# Patient Record
Sex: Male | Born: 1945 | Race: White | Hispanic: No | Smoking: Current every day smoker
Health system: Southern US, Community
[De-identification: ages and names within clinical notes are randomized; demographics above are authoritative.]

## PROBLEM LIST (undated history)

## (undated) DIAGNOSIS — D510 Vitamin B12 deficiency anemia due to intrinsic factor deficiency: Secondary | ICD-10-CM

## (undated) DIAGNOSIS — D649 Anemia, unspecified: Secondary | ICD-10-CM

## (undated) DIAGNOSIS — D696 Thrombocytopenia, unspecified: Secondary | ICD-10-CM

## (undated) DIAGNOSIS — E871 Hypo-osmolality and hyponatremia: Secondary | ICD-10-CM

## (undated) DIAGNOSIS — S065X9A Traumatic subdural hemorrhage with loss of consciousness of unspecified duration, initial encounter: Secondary | ICD-10-CM

## (undated) DIAGNOSIS — E291 Testicular hypofunction: Secondary | ICD-10-CM

## (undated) DIAGNOSIS — R251 Tremor, unspecified: Secondary | ICD-10-CM

## (undated) DIAGNOSIS — C099 Malignant neoplasm of tonsil, unspecified: Secondary | ICD-10-CM

## (undated) DIAGNOSIS — C801 Malignant (primary) neoplasm, unspecified: Secondary | ICD-10-CM

## (undated) HISTORY — PX: LUMBAR DISC SURGERY: SHX700

## (undated) HISTORY — DX: Malignant neoplasm of tonsil, unspecified: C09.9

## (undated) HISTORY — PX: CERVICAL DISC SURGERY: SHX588

## (undated) HISTORY — PX: INSERTION OF VENA CAVA FILTER: SHX5871

## (undated) HISTORY — DX: Tremor, unspecified: R25.1

## (undated) HISTORY — PX: CATARACT EXTRACTION, BILATERAL: SHX1313

---

## 1999-12-29 ENCOUNTER — Encounter: Payer: Self-pay | Admitting: Specialist

## 2000-01-04 ENCOUNTER — Encounter: Payer: Self-pay | Admitting: Specialist

## 2000-01-04 ENCOUNTER — Ambulatory Visit (HOSPITAL_COMMUNITY): Admission: RE | Admit: 2000-01-04 | Discharge: 2000-01-05 | Payer: Self-pay | Admitting: Specialist

## 2002-12-15 ENCOUNTER — Ambulatory Visit (HOSPITAL_COMMUNITY): Admission: RE | Admit: 2002-12-15 | Discharge: 2002-12-16 | Payer: Self-pay | Admitting: Specialist

## 2002-12-15 ENCOUNTER — Encounter: Payer: Self-pay | Admitting: Specialist

## 2004-10-17 ENCOUNTER — Emergency Department (HOSPITAL_COMMUNITY): Admission: EM | Admit: 2004-10-17 | Discharge: 2004-10-17 | Payer: Self-pay | Admitting: Emergency Medicine

## 2005-03-08 ENCOUNTER — Ambulatory Visit (HOSPITAL_COMMUNITY): Admission: RE | Admit: 2005-03-08 | Discharge: 2005-03-08 | Payer: Self-pay | Admitting: Family Medicine

## 2006-08-24 ENCOUNTER — Ambulatory Visit (HOSPITAL_COMMUNITY): Admission: RE | Admit: 2006-08-24 | Discharge: 2006-08-24 | Payer: Self-pay | Admitting: Family Medicine

## 2006-09-25 ENCOUNTER — Ambulatory Visit (HOSPITAL_COMMUNITY): Admission: RE | Admit: 2006-09-25 | Discharge: 2006-09-25 | Payer: Self-pay | Admitting: Family Medicine

## 2006-10-31 ENCOUNTER — Ambulatory Visit (HOSPITAL_COMMUNITY): Admission: RE | Admit: 2006-10-31 | Discharge: 2006-10-31 | Payer: Self-pay | Admitting: Ophthalmology

## 2006-11-08 ENCOUNTER — Ambulatory Visit (HOSPITAL_COMMUNITY): Admission: RE | Admit: 2006-11-08 | Discharge: 2006-11-08 | Payer: Self-pay | Admitting: Neurosurgery

## 2006-11-15 ENCOUNTER — Ambulatory Visit (HOSPITAL_COMMUNITY): Admission: RE | Admit: 2006-11-15 | Discharge: 2006-11-15 | Payer: Self-pay | Admitting: Ophthalmology

## 2007-02-26 ENCOUNTER — Ambulatory Visit (HOSPITAL_COMMUNITY): Admission: RE | Admit: 2007-02-26 | Discharge: 2007-02-26 | Payer: Self-pay | Admitting: General Surgery

## 2007-02-26 ENCOUNTER — Encounter (INDEPENDENT_AMBULATORY_CARE_PROVIDER_SITE_OTHER): Payer: Self-pay | Admitting: General Surgery

## 2008-04-29 ENCOUNTER — Ambulatory Visit (HOSPITAL_COMMUNITY): Admission: RE | Admit: 2008-04-29 | Discharge: 2008-04-29 | Payer: Self-pay | Admitting: Family Medicine

## 2010-05-23 ENCOUNTER — Encounter: Payer: Self-pay | Admitting: Family Medicine

## 2010-09-13 NOTE — H&P (Signed)
NAME:  Zachary Fowler, Zachary Fowler                ACCOUNT NO.:  [id number]   MEDICAL RECORD NO.:  [id number]          PATIENT TYPE:  AMB   LOCATION:  DAY                           FACILITY:  [facility]   PHYSICIAN:  Dalia Heading, M.D.  DATE OF BIRTH:  [birth year]   DATE OF ADMISSION:  DATE OF DISCHARGE:  LH                              HISTORY & PHYSICAL   CHIEF COMPLAINT:  Hematochezia.   HISTORY OF PRESENT ILLNESS:  The patient is a 65 year old white male who  was referred for endoscopic evaluation.  He needs a colonoscopy for  hematochezia.  He had an episode 2 weeks ago which resolved with  suppositories.  No abdominal pain, weight loss, nausea, vomiting,  diarrhea, constipation, melena noted.  He has never had a colonoscopy.  There is no family history of colon carcinoma.   PAST MEDICAL HISTORY:  Hypertension.   PAST SURGICAL HISTORY:  Multiple back surgeries.   MEDICATIONS:  Benicar.   ALLERGIES:  ERYTHROMYCIN.   REVIEW OF SYSTEMS:  The patient does smoke a pack of cigarettes a day.  He drinks alcohol socially.  No other cardiopulmonary difficulties or  bleeding disorders are noted.   PHYSICAL EXAMINATION:  GENERAL:  The patient is a well-developed, well-  nourished, white male in no acute distress.  LUNGS:  Clear to auscultation with equal breath sounds bilaterally.  HEART:  Reveals a regular rate and rhythm with no S3, S4, or murmurs.  ABDOMEN:  Soft, nontender, nondistended.  No hepatosplenomegaly or  masses are noted.  RECTAL:  Deferred to the procedure.   IMPRESSION:  Hematochezia.   PLAN:  The patient was scheduled for a colonoscopy on February 26, 2007.  The  risks and benefits of the procedure including bleeding and perforation  were fully explained to the patient, gave informed consent.      Dalia Heading, M.D.  Electronically Signed     MAJ/MEDQ  D:  02/14/2007  T:  02/15/2007  Job:  [id number]   cc:   [facility]  Fax: [phone]   Patrica Duel, M.D.  Fax: [phone]

## 2010-09-13 NOTE — H&P (Signed)
NAME:  Zachary Fowler, Zachary Fowler                ACCOUNT NO.:  [id number]   MEDICAL RECORD NO.:  [id number]          PATIENT TYPE:  AMB   LOCATION:  DAY                           FACILITY:  [facility]   PHYSICIAN:  Dalia Heading, M.D.  DATE OF BIRTH:  1945/10/08   DATE OF ADMISSION:  DATE OF DISCHARGE:  LH                              HISTORY & PHYSICAL   CHIEF COMPLAINT:  Hematochezia.   HISTORY OF PRESENT ILLNESS:  The patient is a 65 year old white male who  was referred for endoscopic evaluation.  He needs a colonoscopy for  hematochezia.  He had an episode 2 weeks ago which resolved with  suppositories.  No abdominal pain, weight loss, nausea, vomiting,  diarrhea, constipation, melena noted.  He has never had a colonoscopy.  There is no family history of colon carcinoma.   PAST MEDICAL HISTORY:  Hypertension.   PAST SURGICAL HISTORY:  Multiple back surgeries.   MEDICATIONS:  Benicar.   ALLERGIES:  ERYTHROMYCIN.   REVIEW OF SYSTEMS:  The patient does smoke a pack of cigarettes a day.  He drinks alcohol socially.  No other cardiopulmonary difficulties or  bleeding disorders are noted.   PHYSICAL EXAMINATION:  GENERAL:  The patient is a well-developed, well-  nourished, white male in no acute distress.  LUNGS:  Clear to auscultation with equal breath sounds bilaterally.  HEART:  Reveals a regular rate and rhythm with no S3, S4, or murmurs.  ABDOMEN:  Soft, nontender, nondistended.  No hepatosplenomegaly or  masses are noted.  RECTAL:  Deferred to the procedure.   IMPRESSION:  Hematochezia.   PLAN:  The patient was scheduled for a colonoscopy on February 26, 2007.  The  risks and benefits of the procedure including bleeding and perforation  were fully explained to the patient, gave informed consent.      Dalia Heading, M.D.  Electronically Signed     MAJ/MEDQ  D:  02/14/2007  T:  02/15/2007  Job:  [id number]   cc:   [facility]  Fax: [phone]   Patrica Duel, M.D.  Fax: [phone]

## 2010-09-16 NOTE — H&P (Signed)
. [facility]  Patient:    Zachary Fowler, Zachary Fowler                      MRN: [id number] Adm. Date:  01/04/00 Attending:  Kerrin Champagne, M.D. Dictator:   Druscilla Brownie. Underwood III, P.A.-C.                         History and Physical  DATE OF BIRTH:  04-May-1945  CHIEF COMPLAINT:  "Pain in my shoulders and arms."  HISTORY AND PHYSICAL:  This 65 year old white male is seen by [place] for continuing problems concerning pain in his cervical area, as well as in his upper extremities and shoulder area.  The patient also has carpal tunnel symptoms in the upper extremities.  This all began in April when he began experiencing discomfort to his hands.  This worsened and primarily settled into the right shoulder and right arm.  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  He has had weakness and numbness in the right upper extremity, as well as in the left shoulder as well.  Reaching increases his pain and discomfort, as well as raising his arms over his shoulders.  He has grating and popping in his cervical spine with range of motion.  He has had to use Vioxx and Vicodin for his pain and discomfort.  He has been seen by Dr. Javier Docker in our practice, as well as Dr. Romeo Apple in [place].  The patient is noticed to have bulging disks in the cervical spine C6 and C7, and C4 and C5.  He also has evidence of carpal tunnel confirmed with nerve conduction studies.  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  He has had successful lumbar surgeries with laminectomies, resulting in a fusion with excellent success.  We have decided to go ahead with surgical intervention to the cervical spine.  An anterior cervical diskectomy and fusion at C5-6 with iliac crest bone  graft is planned.  PAST MEDICAL/SURGICAL HISTORY: 1. The patient has had three back surgeries, lumbar diskectomies in    1968, 1972, with diskectomy in 1978, along with a fusion.  He has    done very well since then. 2. He has rosacea and takes minocycline one q.d. by Dr. Nita Sells.  CURRENT MEDICATIONS: 1. Minocycline one q.d. 2. Roxicet for discomfort. 3. Vioxx 25 mg one q.d.  ALLERGIES:  No known drug allergies.  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  He is single.  He smokes 1-1/2 packs of cigarettes per day.  He has a moderate intake of beer per day.  FAMILY HISTORY:  Positive for heart disease.  REVIEW OF SYSTEMS:  CNS:  No seizure disorder, paralysis, or double vision, but numbness and tingling consistent with carpal tunnel on the right and radiculopathy from the cervical spine at C5-6.  CARDIOVASCULAR:  No chest pain, no angina, or orthopnea.  RESPIRATORY:  No productive cough, no hemoptysis, no shortness of breath.  GASTROINTESTINAL:  No nausea, vomiting, melena, but has bright blood on the tissue maybe twice a year.  He thinks it is from "hemorrhoids."   GENITOURINARY:  No discharge, dysuria, or hematuria. MUSCULOSKELETAL:  Primarily in the present illness with his neck and upper extremities.  PHYSICAL EXAMINATION:  GENERAL:  Alert, cooperative, and friendly 65 year old white male.  VITAL SIGNS:  Blood pressure 140/82, pulse 80 and regular, respirations 12 and unlabored.  HEENT:  Normocephalic.  PERRLA.  EOMs intact.  Oropharynx clear.  CHEST:  Clear to auscultation.  No rhonchi, no rales.  HEART:  A regular rate and rhythm.  No murmurs are heard.  ABDOMEN:  Soft, nontender.  Liver and spleen not felt.  GENITOURINARY:  Genitalia not done, not pertinent to present illness.  RECTAL:  Not done, not pertinent to present illness.  EXTREMITIES:  The patient has pain with elevation of the arms above the shoulder height.  He has no focal area of deficit  as far as motor is concerned in the upper extremities.  Reflexes 2+ and symmetric bilaterally. He has decrease in extension of the cervical spine but about 10%-15%, and can bend his neck forward with the chin about two finger breadths from the sternum.  Right and left lateral rotation is not uncomfortable.  IMPRESSION: 1. 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 2. Rosacea.  PLAN:  The patient will undergo an anterior cervical diskectomy and fusion at C5-6, with right iliac crest bone graft. DD:  12/29/99 TD:  12/29/99 Job: [id number]

## 2010-09-16 NOTE — Op Note (Signed)
NAME:  RECTOR, DEVONSHIRE                            ACCOUNT NO.:  [id number]   MEDICAL RECORD NO.:  [id number]                   PATIENT TYPE:  OIB   LOCATION:  5029                                 FACILITY:  [facility]   PHYSICIAN:  Kerrin Champagne, M.D.                DATE OF BIRTH:  08-06-1945   DATE OF PROCEDURE:  12/15/2002  DATE OF DISCHARGE:  12/16/2002                                 OPERATIVE REPORT   PREOPERATIVE DIAGNOSES:  1. Bilateral foraminal narrowing, C6-7, below previous anterior cervical     diskectomy and fusion site at C5-6.  2. Mild degenerative changes above the fusion site and below the fusion     site, specifically at C3-4, C4-5, and at C7-T1.   POSTOPERATIVE DIAGNOSES:  1. Bilateral foraminal narrowing, C6-7, below previous anterior cervical     diskectomy and fusion site at C5-6.  2. Mild degenerative changes above the fusion site and below the fusion     site, specifically at C3-4, C4-5, and at C7-T1.  3. Bilateral foraminal entrapment, right side greater than left.   PROCEDURES:  1. Anterior cervical diskectomy and fusion at the C6-7 level with right     iliac crest bone graft harvested  through a separate incision.  1. Internal fixation using a DePuy 22 mm locking plate with 14 mm screws.   SURGEON:  Kerrin Champagne, M.D.   ASSISTANT:  Wende Neighbors, P.A.   ANESTHESIA:  GOT, Zenon Mayo, MD   ESTIMATED BLOOD LOSS:  75 mL.   DRAINS:  TLS drain x1.   BRIEF CLINICAL HISTORY:  The patient is a 65 year old male with progressive  increasing neck pain and radiation to the right arm with whole right arm  weakness, numbness.  Clinically with signs of C7 radiculopathy.  Most of his  pain is predominantly right-sided.  The patient underwent MRI study  demonstrating multilevel degenerative disk changes, spondylosis at the C6-7  level affecting the right neural foramen greater than the left.  He was  brought to the operating room to undergo anterior  diskectomy and fusion at  the C6-7 level below a previous anterior diskectomy and fusion site.   INTRAOPERATIVE FINDINGS:  The patient was found to have severe spondylosis  changes involving the uncovertebral joint on the right side greater than the  left with C7 nerve root compression on the right side.  A small amount of  disk material extruded into the right lateral recess.   DESCRIPTION OF PROCEDURE:  After adequate general anesthesia, the patient in  a beach chair position, five pounds cervical Holter traction, head on the  [place] horseshoe, neck in slight extension.  Bump under the right buttock,  TED stockings.  The arms at the sides, well-padded, all pressure points well-  padded.  Standard prep with Duraprep solution over the anterior and left  neck and over the right iliac  crest, draped in the usual manner.  An iodine  Vi-Drape was used.  An incision, left neck, approximately three inches in  length in line with the patient's skin crease at the incision site used  previously.  Incision through skin and subcutaneous layers down to the  platysma, fascial layer.  This was incised in line with the skin incision.  Blunt dissection then used to carefully spread the soft tissues in the  interval between the trachea and esophagus medially and the carotid sheath  laterally down to the anterior aspect of the cervical spine that was easily  palpable.  NG tube within the esophagus identified this structure throughout  the entire case.  Prevertebral fascia was incised sharply using 15 blade  scalpel along the medial border of the longus colli muscle at the expected  C6 level and extended downward after palpation of the carotid tuberosity  laterally.  A Key elevator then used to tease the soft tissue over the  anterior cervical spine to cross the midline, exposing the expected C6-7  level as well as the area of fused segment at the C5-6 level and appeared to  be solidly fused with large  amount of osteophytic material and hypertrophic  bone anterior to this level.  A spinal needle then placed in the expected C6-  7 level disk space with its sheath remaining intact, only allowing about a  centimeter to protrude beyond the tip of the sheath.  Intraoperative C-arm  fluoroscopy used to identify the level of the spinal needle at C6-7, the  first level below the previous cervical fusion.  Under direct observation  using hand-held Clowards, then the spinal needle was removed and the portion  of the disk excised using a 15 blade scalpel and pituitary rongeurs.  Next  the medial border of the longus colli muscle was freed up bilaterally using  Key elevators and electrocautery appropriately.  A McCullough retractor  inserted, spread, the foot of the retractor beneath the medial border of the  longus colli muscle to protect the esophagus and soft tissue structures  medially and the carotid sheath laterally.  Using loupe magnification and  head lamp, then the anterior two-thirds of the disk were excised at the C6-7  level and the anterior aspect of the vertebral bodies of the inferior aspect  of C6, superior aspect of C7 were debrided of soft tissue present here,  periosteum, scar tissue.  This was done using electrocautery as well as in-  cutting osteophyte resectors as well as Investment banker, operational.  The intervertebral  disk space was distracted using a 14 mm screw post placed in the vertebral  body of C6 and C7, distracting appropriately.  The operating room microscope  draped and brought into the field.  Under direct observation then the end  plates were debrided of cartilaginous end plate material, both the inferior  aspect of C6, superior aspect of C7, back to the posterior margin of the  disk and posterior annulus fibrosus.  This was then excised using a 1 mm  Kerrison on the posterior aspect of the posterior inferior lip of C6 and the posterior superior lip of C7, detaching the annulus  fibrosis and then  resecting using 1 and 2 mm Kerrisons.  Posterior lip osteophytes were  excised centrally and to each side.  Uncovertebral spurs were then removed  using 1 and 2 mm Kerrisons, decompressing the C7 nerve root on the right  side as well as left side.  This was carried  out until the C7 nerve root was  observed to be moving anteriorly beyond the level of the uncovertebral  joints.  Bleeders controlled using bipolar electrocautery and thrombin-  soaked Gelfoam.  The end plates then decorticated further using a high-speed  bur.   Bone graft was then harvested from the right iliac crest through a separate  incision, excising the old previous incision scar over the right side after  infiltration with Marcaine 0.5% and 1:200,000 epinephrine.  The incision  carried sharply to the anterior lateral iliac crest, then incised sharply.  Subperiosteal dissection carried medial and lateral, exposing the old  previous graft site harvest.  With a dual oscillating saw set at the  appropriate width and depth, width chosen using a sounder from DePuy  anterior cervical set, sounded at 7 mm.  A single oscillating saw blade was  used to make the cut.  A wafer of bone 7 mm in diameter was then removed  from the iliac crest, dividing its base using quarter-inch osteotome.  Bone  wax was applied to the bleeding cancellous bone surfaces following removal  of the bone graft.  Gelfoam applied and packing.  Incision in the neck area  was then irrigated with copious amounts of irrigant solution.  Careful  inspection of the disk space demonstrated no bony material remaining that  could be retropulsed with insertion of the graft.  The graft was then  placed, impacted into place, subset about 1-2 mm below the anterior  vertebral disk margin.  Note that the depth of the intervertebral disk space  had been measured approximately 19 mm, graft width of 7 mm and a depth of 15  mm was used.  This allowed plenty  of room for inset of the graft.  Distraction was then released.  A 22 mm plate was chosen to bridge the  intervertebral disk space.  This was then temporarily pinned in place using  retaining pins.  Drill holes then placed using a 14 mm drill with a positive-  stop drill sleeve.  The first drill hole was made in the lower screw holes,  then 14 mm screws placed, then in the upper screw holes.  Note that five-  pound cervical Holter traction was released prior to the insertion of the  upper screws, and also retaining pins were removed to allow for compression  across the graft site prior to the insertion of the screws, providing some  amount of compression.  With this, the plate appeared to anneal against the  anterior aspect of the cervical spine without any evidence of lift-off.  Intraoperative C-arm fluoroscopy demonstrated plates and screws in good  position and alignment, bone graft in good position and alignment, without evidence of retropulsion or impingement on the cervical canal.  Following  irrigation, then the cervical distraction and retraction system was removed.  There were noted to be some small bleeders from the thyroid vein present.  This was then clamped and then suture ligated, both the distal and proximal  stumps were suture ligated.  Careful inspection of the wound then  demonstrated no active bleeding evident.  A 10 French TLS drain was then  placed in the depth of the incision exiting inferior to the skin incision.  This was sewn in place with a 4-0 nylon suture.  The platysmal layer then  reapproximated with interrupted 2-0 Vicryl suture following the inspection  of the esophagus and soft tissue structures, which demonstrated no  abnormality.  Following the closure of  the platysma fascia, the deep subcu  layer was approximated with interrupted 3-0 Vicryl sutures, the skin closed  with a running subcu stitch of 4-0 Vicryl.  The right iliac crest bone graft  harvest site  was then carefully irrigated, bone wax applied to the bleeding  cancellous bone surfaces to obtain excellent hemostasis, irrigation further,  Gelfoam placed.  The fascia of the abdominal muscle area and the upper thigh  was then approximated over the iliac crest with interrupted #1 Vicryl  sutures, the deep subcu layer was approximated with interrupted #1 and 2-0  Vicryl sutures, skin closed with a running subcu stitch of 4-0 Vicryl.  Tincture of Benzoin and Steri-Strips applied to the neck and right iliac  crest.  The TLS drain was charged.  A dry dressing of 4 x 4's affixed to the  iliac crest and neck with Hypafix tape.  Patient placed in a [place]  collar.  The patient was then reactivated, extubated, and returned to the  recovery room in satisfactory condition.  All instrument and sponge counts  were correct.                                               Kerrin Champagne, M.D.    JEN/MEDQ  D:  12/17/2002  T:  12/18/2002  Job:  [id number]

## 2010-09-16 NOTE — Op Note (Signed)
[place]  Patient:    Zachary Fowler, Zachary Fowler                         MRN: [id number] Proc. Date: 01/04/00 Adm. Date:  [id number] Disc. Date: [id number] Attending:  Lubertha South                           Operative Report  PREOPERATIVE DIAGNOSES: 1. Right C5-C6 herniated nucleus pulposus. 2. Degenerative disk disease C4-C5 and C6-C7.  POSTOPERATIVE DIAGNOSES: 1. Right C5-C6 herniated nucleus pulposus. 2. Degenerative disk disease C4-C5 and C6-C7.  PROCEDURE:  Anterior cervical diskectomy and fusion at the C5-C6 level utilizing right iliac crest bone graft harvested through a separate incision. Microscope used for decompression of the right C6 nerve root and excision of the HNP central and right C5-C6.  SURGEON:  Kerrin Champagne, M.D.  ASSISTANT:  Javier Docker, M.D.  ANESTHESIA:  GOT, Dr. ______.  ESTIMATED BLOOD LOSS:  75 cc.  DRAINS:  None.  BRIEF CLINICAL HISTORY:  This patient, a 65 year old male, who has been experiencing a five month history of neck pain and radiation to his right shoulder and arm has been followed by my partner, Dr. Shelle Iron, for apparent carpal tunnel syndrome.  He has been experiencing pain as well, into his neck. Reproduction of discomfort with rotation and extension of his neck.  He underwent extensive evaluation with attempts at conservative management including therapy with traction, McKenzie exercises.  EMG nerve conduction studies have demonstrated a mild right carpal tunnel syndrome.  MR study has demonstrated a moderate disk protrusion on the right side at C5-C6 affecting the right C6 nerve root.  Clinical exam is consistent for right C6 radiculopathy with some minimal weakness with right forearm pronation.  After attempts at conservative management, it is recommended the patient undergo an anterior cervical diskectomy and fusion.  INTRAOPERATIVE FINDINGS:  The patient is found to have significant prolapse  of the posterior aspect of the annulus on the right side, extending into the spinal canal, affecting the right cord and right C6 nerve root.  Old fragments of disk material were noted to be within the canal overlying the right C6 nerve root within the nerve foramen.  Some very mild spondylosis changes were noted.  These did not appear to be the major cause.  Nerve compression and soft disk did appear to be the major cause.  DESCRIPTION OF PROCEDURE:  After adequate general anesthesia, the patient in a beach chair position, 5 pounds cervical Holter traction.  Elbows and arms well padded by the sides.  TED hose to prevent deep venous thrombosis.  Neck in very slight extension.  Left neck and right iliac crest were then prepped with Dura-Prep solution, draped in the usual manner.  Iodine impregnated Vi-Drape was used over the neck and also over the right iliac crest.  Incision made over the anterior aspect of the neck at the expected C6 level after feeling and palpating carotid tuberosity anterolaterally over the neck and the cricothyroid cartilage.  Incision approximately 2-2.5 inches in length in line with the lines of the patients skin creases on the neck. Incision carried sharply through skin and subcutaneous layers to the superficial platysmal layer.  Self-retaining retractors inserted.  Platysmal layer then spread in the midline using Metzenbaum scissors.  At the interval between the trachea and esophagus medial and the carotid sheath and its contents lateral were  then developed using finger dissection to the anterior aspect of the cervical spine.  Hand-held retractors used during this portion of the procedure for examination of the anterior aspect of the cervical spine. Prevertebral fascia was carefully teased from the medial border of the longus colli muscle across the midline, further exposing the anterior longitudinal ligament and the anterior cervical spine.  Two spinal needles 18  gauge were then placed with only 1 cm of the needle extending beyond the needle sheath into the disk space at C5-C6 and C6-C7.  Intraoperative radiograph obtained which demonstrated the needles in excellent position alignment, one at C5-C6 and one at C6-C7.  Under direct observation, the lower needle was then removed and then under direct observation, the upper needle removed and a 15 blade scalpel used to incise the anterior aspect of the disk at this level and excise a portion of the anterior annulus for continued identification at this level.  Medial border of longus colli muscle was then carefully freed up, both left and right side with careful hand-held retraction.  First cautery was performed and then Key elevator used to elevate the longus colli muscle bilaterally.  The Cloward self-retaining retractors were inserted with the foot of the Cloward retractor beneath the medial border of the longus colli muscle both sides to obtain excellent exposure of the anterior aspect of the cervical spine and protect the soft tissues, retractors both medial and lateral.  The under direct observation with excellent exposure, the remaining portion of the anterior aspect of the disk was excised using a 15 blade scalpel and 3 mm Kerrisons as well as microcurettes and pituitary rongeurs. When over 2/3 of the disk had been removed, 16 mm screw posts were then inserted into the anterior aspect of vertebral body of C6 and C5, distraction obtained across the intervertebral disk space.  The disk space distracted, then further curettage was carried out over the cartilaginous endplates, the inferior aspect of C5 and superior aspect of C6, debriding this back to hard cortical bone surfaces that showed some bleeding through.  The operating room microscope was then draped and brought into the field, and the posterior 1/3 of the disk was excised using microcurettes as well as pituitary rongeurs, and a 1 mm  Kerrison was then able to be introduced over the posterior aspect superior lip of C6, excising the lip posteriorly and freeing up the posterior annular fibers.  Posterior annulus on the right side  was noted to be folded into the spinal canal on the right side and required freeing up using a titanium micronerve hook, thus pulling it back into the disk space and then excising it using 1-2 mm Kerrisons.  The right upper vertebral joint was carefully debrided of uncovertebral spurs that were mild in appearance.  Finally the posterior longitudinal ligament was excised on the right side beginning from the rent in the midline using a 1 mm Kerrison after elevation with the micro titanium nerve hook.  The whole right posterior longitudinal ligament was excised out into the neuroforamen and the right C6 nerve root decompressed.  Disk material was found to be present over the superior aspect of the C6 nerve root and extending out into the neuroforamen to about 4 mm.  After the nerve root was felt to be completely freed, irrigation was performed.  Careful hemostasis of small epidurals were controlled using bipolar electrocautery.  Thrombin soaked Gelfoam was then placed over the endplates and used to carefully hemostase the endplates as best as  possible.  The height of the intervertebral disk space was measured at 9 mm with depth measured at 20 mm.  A 15 mm graft x 9 mm height was chosen. Right iliac crest bone graft was then harvested through a separate incision over the right anterior lateral iliac crest over its superficial portion, first infiltrating the skin with Marcaine 0.5% with 1:200,000 epinephrine, incising over a 2.5 inch incision down through the skin and subcutaneous layers to the superficial border of the right iliac crest laterally. Electrocautery was then used to incise the periosteum overlying this laterally, and then subperiosteal dissection carried lateral and medial, exposing  the inner and outer table of the iliac crest, protecting soft tissue structures both medial and lateral.  Dual oscillating saw was to be used; however, none at 9 mm was available, so a single blade oscillating saw was used, carefully protecting the soft tissue, used to first cut the inferior portion of the graft, then measured 9 mm blade held parallel to previous cut and second cut made.  The base was then transected using an Osteotome, and graft removed from the iliac crest.  Bleeding of the iliac crest was then controlled using bone wax, and thrombin soaked Gelfoam applied.  Bone graft measured to 15-16 mm depth, height of 9 mm.  Then carefully tapered to the dimensions of the intervertebral disk space.  Graft was then inserted into the intervertebral disk space after examination demonstrated that there was no soft tissue remaining within the intervertebral disk space that could be retropulsed with the graft insertion.  Graft was then impacted into place and obtained excellent purchase.  The intervertebral disk space distraction system was then removed, removing first the self-retaining portion, and the screw posts, carefully protecting soft tissue structures.  Bone wax was applied to the bleeding bone surfaces where the screw posts had been removed.  Five pound cervical traction was also released.  The anterior osteophytes overlying the anterior aspect of the C5-C6 level were then excised centrally.  The lateral portion of the osteophytes that were contained within the longus colli muscle remained in place and were not excised.  Intraoperative radiogram demonstrated the bone plug at the C5-C6 level in excellent position alignment with restoration of the height of this intervertebral disk space.  There was no evidence of retropulsion of the bone graft.  There was no sign of bone graft within the spinal canal.  Irrigation was then performed.  There was no active bleeding felt to be present.   Some small areas had been cauterized previously with bipolar electrocautery.  These did not show any active bleeding. Examination of the esophagus demonstrated no abnormality.  Soft tissue structures were allowed to fall back into place.  The platysma layers were approximated with interrupted 3-0 Vicryl sutures.  Deep subcutaneous layers approximated with interrupted 3-0 Vicryl sutures, and skin closed with a running subcuticular stitch of 4-0 PDS that can be pulled out. Tincture of Benzoin and Steri-Strips applied and a 4 x 4 affixed to the skin with Hypafix tape.  Radially iliac crest bone graft harvest site was closed prior to the application of dressings.  It was closed by first approximating the periosteum over the defect in the iliac crest using interrupted #1 Vicryl sutures.  Deep subcutaneous layers approximated with interrupted #1 and 0 Vicryl sutures, the more superficial area with interrupted 2-0 Vicryl sutures, and the skin closed with a running subcutaneous stitch of 4-0 Vicryl. Tincture of Benzoin and Steri-Strips applied and 4 x  4 affixed to the skin with Hypafix tape.  Philadelphia collar was then applied to the neck.  The patient was then reactivated and returned to the recovery room in satisfactory condition.  All instrument and sponge counts were correct. DD:  01/04/00 TD:  01/04/00 Job: [id number]

## 2011-02-15 LAB — HEMOGLOBIN AND HEMATOCRIT, BLOOD
HCT: 41
Hemoglobin: 14.4

## 2011-02-15 LAB — BASIC METABOLIC PANEL
CO2: 26
Calcium: 9
Creatinine, Ser: 0.8
GFR calc Af Amer: >60

## 2011-02-16 ENCOUNTER — Encounter: Payer: Self-pay | Admitting: *Deleted

## 2011-02-16 ENCOUNTER — Emergency Department (HOSPITAL_COMMUNITY): Payer: Medicare Other

## 2011-02-16 ENCOUNTER — Inpatient Hospital Stay (HOSPITAL_COMMUNITY): Payer: Medicare Other

## 2011-02-16 ENCOUNTER — Inpatient Hospital Stay (HOSPITAL_COMMUNITY)
Admission: EM | Admit: 2011-02-16 | Discharge: 2011-02-22 | DRG: 640 | Disposition: A | Discharge status: Home / Self Care | Payer: Medicare Other | Attending: Internal Medicine | Admitting: Internal Medicine

## 2011-02-16 DIAGNOSIS — D696 Thrombocytopenia, unspecified: Secondary | ICD-10-CM | POA: Diagnosis present

## 2011-02-16 DIAGNOSIS — F10239 Alcohol dependence with withdrawal, unspecified: Secondary | ICD-10-CM | POA: Diagnosis present

## 2011-02-16 DIAGNOSIS — Z981 Arthrodesis status: Secondary | ICD-10-CM

## 2011-02-16 DIAGNOSIS — R296 Repeated falls: Secondary | ICD-10-CM | POA: Diagnosis present

## 2011-02-16 DIAGNOSIS — S065X0A Traumatic subdural hemorrhage without loss of consciousness, initial encounter: Secondary | ICD-10-CM | POA: Diagnosis present

## 2011-02-16 DIAGNOSIS — R269 Unspecified abnormalities of gait and mobility: Secondary | ICD-10-CM | POA: Diagnosis present

## 2011-02-16 DIAGNOSIS — S065XAA Traumatic subdural hemorrhage with loss of consciousness status unknown, initial encounter: Secondary | ICD-10-CM | POA: Diagnosis present

## 2011-02-16 DIAGNOSIS — D529 Folate deficiency anemia, unspecified: Secondary | ICD-10-CM | POA: Diagnosis present

## 2011-02-16 DIAGNOSIS — W19XXXA Unspecified fall, initial encounter: Secondary | ICD-10-CM | POA: Diagnosis present

## 2011-02-16 DIAGNOSIS — I609 Nontraumatic subarachnoid hemorrhage, unspecified: Secondary | ICD-10-CM | POA: Diagnosis present

## 2011-02-16 DIAGNOSIS — D510 Vitamin B12 deficiency anemia due to intrinsic factor deficiency: Secondary | ICD-10-CM | POA: Diagnosis present

## 2011-02-16 DIAGNOSIS — E538 Deficiency of other specified B group vitamins: Secondary | ICD-10-CM | POA: Diagnosis present

## 2011-02-16 DIAGNOSIS — R270 Ataxia, unspecified: Secondary | ICD-10-CM | POA: Diagnosis present

## 2011-02-16 DIAGNOSIS — M4802 Spinal stenosis, cervical region: Secondary | ICD-10-CM | POA: Diagnosis present

## 2011-02-16 DIAGNOSIS — K7010 Alcoholic hepatitis without ascites: Secondary | ICD-10-CM | POA: Diagnosis present

## 2011-02-16 DIAGNOSIS — D539 Nutritional anemia, unspecified: Secondary | ICD-10-CM | POA: Diagnosis present

## 2011-02-16 DIAGNOSIS — E291 Testicular hypofunction: Secondary | ICD-10-CM | POA: Diagnosis present

## 2011-02-16 DIAGNOSIS — IMO0001 Reserved for inherently not codable concepts without codable children: Secondary | ICD-10-CM | POA: Diagnosis present

## 2011-02-16 DIAGNOSIS — E871 Hypo-osmolality and hyponatremia: Secondary | ICD-10-CM | POA: Diagnosis present

## 2011-02-16 DIAGNOSIS — F1020 Alcohol dependence, uncomplicated: Secondary | ICD-10-CM | POA: Diagnosis present

## 2011-02-16 DIAGNOSIS — D649 Anemia, unspecified: Secondary | ICD-10-CM

## 2011-02-16 DIAGNOSIS — F10939 Alcohol use, unspecified with withdrawal, unspecified: Secondary | ICD-10-CM | POA: Diagnosis present

## 2011-02-16 DIAGNOSIS — S065X9A Traumatic subdural hemorrhage with loss of consciousness of unspecified duration, initial encounter: Secondary | ICD-10-CM

## 2011-02-16 DIAGNOSIS — E876 Hypokalemia: Principal | ICD-10-CM | POA: Diagnosis present

## 2011-02-16 HISTORY — DX: Testicular hypofunction: E29.1

## 2011-02-16 HISTORY — DX: Hypomagnesemia: E83.42

## 2011-02-16 HISTORY — DX: Hypo-osmolality and hyponatremia: E87.1

## 2011-02-16 HISTORY — DX: Traumatic subdural hemorrhage with loss of consciousness of unspecified duration, initial encounter: S06.5X9A

## 2011-02-16 HISTORY — DX: Other disorders of phosphorus metabolism: E83.39

## 2011-02-16 HISTORY — DX: Anemia, unspecified: D64.9

## 2011-02-16 HISTORY — DX: Thrombocytopenia, unspecified: D69.6

## 2011-02-16 LAB — BASIC METABOLIC PANEL
BUN: 21 mg/dL (ref 6–23)
CO2: 28 mEq/L (ref 19–32)
Calcium: 8.4 mg/dL (ref 8.4–10.5)
Chloride: 83 mEq/L — ABNORMAL LOW (ref 96–112)
Creatinine, Ser: 0.9 mg/dL (ref 0.50–1.35)
GFR calc non Af Amer: 88 mL/min — ABNORMAL LOW (ref >90)
GFR calc non Af Amer: >90 mL/min (ref >90)
Glucose, Bld: 110 mg/dL — ABNORMAL HIGH (ref 70–99)
Potassium: 3.2 mEq/L — ABNORMAL LOW (ref 3.5–5.1)
Sodium: 131 mEq/L — ABNORMAL LOW (ref 135–145)
Sodium: 133 mEq/L — ABNORMAL LOW (ref 135–145)

## 2011-02-16 LAB — CBC
HCT: 36.9 % — ABNORMAL LOW (ref 39.0–52.0)
Hemoglobin: 11.8 g/dL — ABNORMAL LOW (ref 13.0–17.0)
MCH: 39.2 pg — ABNORMAL HIGH (ref 26.0–34.0)
MCH: 39.5 pg — ABNORMAL HIGH (ref 26.0–34.0)
MCV: 109.5 fL — ABNORMAL HIGH (ref 78.0–100.0)
MCV: 111 fL — ABNORMAL HIGH (ref 78.0–100.0)
Platelets: 120 10*3/uL — ABNORMAL LOW (ref 150–400)
RBC: 2.99 MIL/uL — ABNORMAL LOW (ref 4.22–5.81)
RDW: 13.4 % (ref 11.5–15.5)
WBC: 4.8 10*3/uL (ref 4.0–10.5)

## 2011-02-16 LAB — DIFFERENTIAL
Eosinophils Relative: 0 % (ref 0–5)
Lymphocytes Relative: 8 % — ABNORMAL LOW (ref 12–46)
Monocytes Absolute: 0.4 10*3/uL (ref 0.1–1.0)

## 2011-02-16 LAB — VITAMIN B12: Vitamin B-12: 434 pg/mL (ref 211–911)

## 2011-02-16 LAB — TSH: TSH: 1.055 u[IU]/mL (ref 0.350–4.500)

## 2011-02-16 LAB — MAGNESIUM: Magnesium: 1.4 mg/dL — ABNORMAL LOW (ref 1.5–2.5)

## 2011-02-16 LAB — HEPATIC FUNCTION PANEL
Alkaline Phosphatase: 82 U/L (ref 39–117)
Indirect Bilirubin: 0.6 mg/dL (ref 0.3–0.9)
Total Bilirubin: 1.5 mg/dL — ABNORMAL HIGH (ref 0.3–1.2)

## 2011-02-16 LAB — PROTIME-INR: Prothrombin Time: 13.9 seconds (ref 11.6–15.2)

## 2011-02-16 MED ORDER — ONDANSETRON HCL 4 MG PO TABS: 4.0000 mg | ORAL_TABLET | Freq: Four times a day (QID) | ORAL | Status: DC | PRN

## 2011-02-16 MED ORDER — KCL IN DEXTROSE-NACL 20-5-0.9 MEQ/L-%-% IV SOLN
INTRAVENOUS | Status: DC
  Administered 2011-02-16: 1000 mL via INTRAVENOUS
  Administered 2011-02-16: 21:00:00 via INTRAVENOUS
  Filled 2011-02-16 (×4): qty 1000

## 2011-02-16 MED ORDER — SODIUM CHLORIDE 0.9 % IV BOLUS (SEPSIS)
1000.0000 mL | Freq: Once | INTRAVENOUS | Status: AC
  Administered 2011-02-16: 1000 mL via INTRAVENOUS

## 2011-02-16 MED ORDER — ENOXAPARIN SODIUM 40 MG/0.4ML ~~LOC~~ SOLN
40.0000 mg | SUBCUTANEOUS | Status: DC
  Administered 2011-02-16: 40 mg via SUBCUTANEOUS
  Filled 2011-02-16: qty 0.4

## 2011-02-16 MED ORDER — LORAZEPAM 1 MG PO TABS: 1.0000 mg | ORAL_TABLET | Freq: Four times a day (QID) | ORAL | Status: AC | PRN

## 2011-02-16 MED ORDER — LORAZEPAM 2 MG/ML IJ SOLN
1.0000 mg | Freq: Four times a day (QID) | INTRAMUSCULAR | Status: AC | PRN
  Administered 2011-02-16 – 2011-02-18 (×3): 1 mg via INTRAVENOUS
  Filled 2011-02-16 (×3): qty 1

## 2011-02-16 MED ORDER — THIAMINE HCL 100 MG/ML IJ SOLN: 100.0000 mg | Freq: Every day | INTRAMUSCULAR | Status: DC

## 2011-02-16 MED ORDER — POTASSIUM CHLORIDE 10 MEQ/100ML IV SOLN
10.0000 meq | INTRAVENOUS | Status: AC
  Administered 2011-02-16 (×2): 10 meq via INTRAVENOUS
  Filled 2011-02-16: qty 200

## 2011-02-16 MED ORDER — SODIUM PHOSPHATE 3 MMOLE/ML IV SOLN
20.0000 mmol | Freq: Once | INTRAVENOUS | Status: AC
  Administered 2011-02-16: 20 mmol via INTRAVENOUS
  Filled 2011-02-16: qty 6.67

## 2011-02-16 MED ORDER — MAGNESIUM SULFATE 50 % IJ SOLN
4.0000 g | Freq: Once | INTRAVENOUS | Status: AC
  Administered 2011-02-16: 4 g via INTRAVENOUS
  Filled 2011-02-16: qty 8

## 2011-02-16 MED ORDER — POTASSIUM CHLORIDE 10 MEQ/100ML IV SOLN
10.0000 meq | Freq: Once | INTRAVENOUS | Status: AC
  Administered 2011-02-16: 10 meq via INTRAVENOUS
  Filled 2011-02-16: qty 100

## 2011-02-16 MED ORDER — THERA M PLUS PO TABS
1.0000 | ORAL_TABLET | Freq: Every day | ORAL | Status: DC
  Administered 2011-02-16 – 2011-02-22 (×7): 1 via ORAL
  Filled 2011-02-16 (×7): qty 1

## 2011-02-16 MED ORDER — LORAZEPAM 1 MG PO TABS
0.0000 mg | ORAL_TABLET | Freq: Two times a day (BID) | ORAL | Status: AC
  Administered 2011-02-18: 3 mg via ORAL
  Filled 2011-02-16: qty 3

## 2011-02-16 MED ORDER — PNEUMOCOCCAL VAC POLYVALENT 25 MCG/0.5ML IJ INJ
0.5000 mL | INJECTION | INTRAMUSCULAR | Status: AC
  Administered 2011-02-17: 0.5 mL via INTRAMUSCULAR
  Filled 2011-02-16: qty 0.5

## 2011-02-16 MED ORDER — POTASSIUM CHLORIDE 20 MEQ PO PACK
40.0000 meq | PACK | Freq: Two times a day (BID) | ORAL | Status: DC
  Administered 2011-02-16: 40 meq via ORAL
  Filled 2011-02-16: qty 2

## 2011-02-16 MED ORDER — ONDANSETRON HCL 4 MG/2ML IJ SOLN
4.0000 mg | Freq: Four times a day (QID) | INTRAMUSCULAR | Status: DC | PRN
  Administered 2011-02-16: 4 mg via INTRAVENOUS
  Filled 2011-02-16: qty 2

## 2011-02-16 MED ORDER — VITAMIN B-1 100 MG PO TABS
100.0000 mg | ORAL_TABLET | Freq: Every day | ORAL | Status: DC
  Administered 2011-02-16 – 2011-02-22 (×7): 100 mg via ORAL
  Filled 2011-02-16 (×7): qty 1

## 2011-02-16 MED ORDER — LORAZEPAM 2 MG/ML IJ SOLN
1.0000 mg | Freq: Once | INTRAMUSCULAR | Status: AC
  Administered 2011-02-16: 1 mg via INTRAVENOUS
  Filled 2011-02-16: qty 1

## 2011-02-16 MED ORDER — FOLIC ACID 1 MG PO TABS
1.0000 mg | ORAL_TABLET | Freq: Every day | ORAL | Status: DC
  Administered 2011-02-16 – 2011-02-22 (×7): 1 mg via ORAL
  Filled 2011-02-16 (×7): qty 1

## 2011-02-16 MED ORDER — LORAZEPAM 1 MG PO TABS: 0.0000 mg | ORAL_TABLET | Freq: Four times a day (QID) | ORAL | Status: AC

## 2011-02-16 MED ORDER — POTASSIUM CHLORIDE 10 MEQ/100ML IV SOLN
10.0000 meq | INTRAVENOUS | Status: AC
  Administered 2011-02-16 (×2): 10 meq via INTRAVENOUS
  Filled 2011-02-16: qty 100

## 2011-02-16 NOTE — ED Notes (Signed)
CRITICAL VALUE ALERT  Critical value received:  K+ 2.4  Date of notification: 02/16/2011  Time of notification: 0155  Critical value read back: Yes  Nurse who received alert:  J.Ladavia Lindenbaum  MD notified (1st page):  Dr. Bebe Shaggy notified.

## 2011-02-16 NOTE — ED Provider Notes (Signed)
History     CSN: [id number] Arrival date & time: 02/16/2011 12:55 AM   First MD Initiated Contact with Patient 02/16/11 0054      Chief Complaint  Patient presents with  . Weakness    (Consider location/radiation/quality/duration/timing/severity/associated sxs/prior treatment) Patient is a 65 y.o. male presenting with weakness. The history is provided by the patient.  Weakness The primary symptoms include dizziness, nausea and vomiting. Primary symptoms do not include headaches, syncope or loss of consciousness. The symptoms began more than 1 week ago. The symptoms are waxing and waning. The neurological symptoms are diffuse.  Dizziness also occurs with nausea, vomiting and weakness.  Additional symptoms include weakness.  pt reports for past week he has had multiple episodes of nonbloody vomiting and today he had loose nonbloody bowel movement.  He has had had decreased PO as well He reports long h/o generalized weakness/fatigue with frequent falls over past several months Last fall was a week ago and hit his head, but no HA and reports imbalance proceeded his fall. He does admit to frequent ETOH use No current CP is reported though he does endorse SOB  Past Medical History  Diagnosis Date  . Hypertension     History reviewed. No pertinent past surgical history.  History reviewed. No pertinent family history.  History  Substance Use Topics  . Smoking status: Current Everyday Smoker -- 1.0 packs/day  . Smokeless tobacco: Not on file  . Alcohol Use: Yes     1 gallon/wk      Review of Systems  Cardiovascular: Negative for syncope.  Gastrointestinal: Positive for nausea and vomiting.  Neurological: Positive for dizziness and weakness. Negative for loss of consciousness and headaches.  All other systems reviewed and are negative.    Allergies  Review of patient's allergies indicates no known allergies.  Home Medications  No current outpatient prescriptions on  file.  BP 128/79  Pulse 110  Temp 98.2 F (36.8 C)  Resp 22  Ht 5\' 10"  (1.778 m)  Wt 185 lb (83.915 kg)  BMI 26.54 kg/m2  SpO2 100%  Physical Exam  CONSTITUTIONAL: Well developed/well nourished HEAD AND FACE: healed laceration to right eyebrow, no erythema/drainage noted EYES: EOMI/PERRL ENMT: Mucous membranes dry NECK: supple no meningeal signs SPINE:cspine nontender CV: tachycardia LUNGS: Lungs are clear to auscultation bilaterally, no apparent distress ABDOMEN: soft, nontender, no rebound or guarding NEURO: Pt is awake/alert, moves all extremitiesx4, no gross motor deficits, but does have tremor at rest EXTREMITIES: pulses normal, full ROM SKIN: warm, color normal PSYCH: anxious   ED Course  Procedures (including critical care time)  Labs Reviewed  BASIC METABOLIC PANEL - Abnormal; Notable for the following:    Sodium 131 (*)    Potassium 2.4 (*)    Chloride 83 (*)    Glucose, Bld 141 (*)    GFR calc non Af Amer 88 (*)    All other components within normal limits  CBC - Abnormal; Notable for the following:    RBC 3.37 (*)    HCT 36.9 (*)    MCV 109.5 (*)    MCH 39.2 (*)    Platelets 140 (*)    All other components within normal limits  DIFFERENTIAL - Abnormal; Notable for the following:    Neutrophils Relative 85 (*)    Lymphocytes Relative 8 (*)    Lymphs Abs 0.5 (*)    All other components within normal limits  URINALYSIS, ROUTINE W REFLEX MICROSCOPIC   Dg Chest Portable 1  View  02/16/2011  *RADIOLOGY REPORT*  Clinical Data: Shortness of breath.  PORTABLE CHEST - 1 VIEW  Comparison: 03/08/2005  Findings: Heart and mediastinal contours are within normal limits. No focal opacities or effusions.  No acute bony abnormality.  IMPRESSION: No active cardiopulmonary disease.  Original Report Authenticated By: Cyndie Chime, M.D.       MDM  Nursing notes reviewed and considered in documentation All labs/vitals reviewed and considered xrays reviewed and  considered    Date: 02/16/2011  Rate: 111  Rhythm: sinus tachycardia  QRS Axis: normal  Intervals: QT prolonged  ST/T Wave abnormalities: U waves noted  Conduction Disutrbances:none  Narrative Interpretation:   Old EKG Reviewed: changes noted   2:05 AM Pt with vomiting/loose stool, has EKG changes c/w hypokalemia Will need admission I did not do any brain imaging as pt had no HA, +eomi, no focal tenderness to face/scalp    I discussed case with daughter who confirms story D/w dr Conley Rolls, will admit to medicine     Joya Gaskins, MD 02/16/11 [phone]

## 2011-02-16 NOTE — Progress Notes (Signed)
CRITICAL VALUE ALERT  Critical value received:  Phosphorus 1  Date of notification:  02/16/2011  Time of notification:  0809  Critical value read back:yes  Nurse who received alert:  Holly Bodily, RN  MD notified (1st page):  Lendell Caprice  Time of first page:  0809  MD notified (2nd page):[place]  Time of second page:0900  Responding MD:  Lendell Caprice  Time MD responded:  [phone]

## 2011-02-16 NOTE — Progress Notes (Signed)
The patient was admitted after midnight.  Chart reviewed, patient examined, discussed history with brother.  CT brain shows 5 mm right frontoparietal SDH.  This is small enough, we can manage this here. I spoke with Dr. Danielle Dess, who agrees.  Rescan tomorrow. Patient is alert, confused, but no focal defecits.  Magnesium, phosphorus and potassium low. Replete.  PT, OT, consult. May need placement.

## 2011-02-16 NOTE — Progress Notes (Signed)
Physical Therapy Evaluation Patient Details Name: Zachary Fowler MRN: [id number] DOB: 11-11-1945 Today's Date: 02/16/2011 Charges: 1 eval  Problem List:  Patient Active Problem List  Diagnoses  . Alcoholism /alcohol abuse  . Hypokalemia  . Ataxia  . Falling episodes    Past Medical History:  Past Medical History  Diagnosis Date  . Hypertension    Past Surgical History: History reviewed. No pertinent past surgical history.  PT Assessment/Plan/Recommendation PT Assessment Clinical Impression Statement: Pt referred to PT secondary to recent SDH.  He reports he was W/C bound before coming into the hospital for the past 3 months.  Unsure if this is accurate information secondary to history of being a poor historian.   After examintion it was found that he has current impairments including impaired static balance, decreased strength, impaired coordination,  and difficulty with transfers. PT Recommendation/Assessment: Patient will need skilled PT in the acute care venue PT Problem List: Decreased strength;Decreased range of motion;Decreased activity tolerance;Decreased balance;Decreased mobility;Decreased coordination Barriers to Discharge: Decreased caregiver support PT Therapy Diagnosis : Difficulty walking;Generalized weakness PT Plan PT Frequency: Min 3X/week PT Treatment/Interventions: Functional mobility training;Therapeutic exercise;Balance training PT Recommendation Follow Up Recommendations: Skilled nursing facility Equipment Recommended: Defer to next venue PT Goals  Acute Rehab PT Goals PT Goal Formulation: With patient Time For Goal Achievement: 5 days Pt will Sit at [facility] of Bed: with min assist;with bilateral upper extremity support Pt will Transfer Sit to Stand/Stand to Sit: with max assist Pt will Perform Home Exercise Program: with supervision, verbal cues required/provided  PT Evaluation Precautions/Restrictions    Prior Functioning  Home Living Lives With:  Family Receives Help From: Family Type of Home: House Home Layout: One level Home Access: Ramped entrance Entrance Stairs-Rails: None Additional Comments: Pt reports over the summer he started to decline and went from walking independently to [facility] bound in 3 months secondary to loss of appetite and lack of nutrition Prior Function Level of Independence: Needs assistance with homemaking;Other (comment);Needs assistance with gait (Pt reports WC level) Cognition Cognition Arousal/Alertness: Lethargic Orientation Level: Oriented X4;Oriented to person;Oriented to place;Oriented to time;Oriented to situation Sensation/Coordination Sensation Proprioception: Impaired Detail Additional Comments:  Proproception is impaired with trunk and BUE Coordination Gross Motor Movements are Fluid and Coordinated: No Coordination and Movement Description: Pt slow for finger to nose test and had mild undershooting. Extremity Assessment   Mobility (including Balance) Bed Mobility Bed Mobility: Yes Sitting - Scoot to Edge of Bed: 2: Max assist Sit to Supine - Right: 3: Mod assist Transfers Transfers: No Ambulation/Gait Ambulation/Gait: No Stairs: No  Posture/Postural Control Posture/Postural Control: Postural limitations Postural Limitations: signficant slouched posture and unable to maintain upright sitting position on EOB w/feet supported and UE support Balance Balance Assessed: Yes Static Sitting Balance Static Sitting - Comment/# of Minutes: Unable to maintain unsupported sitting on EOB without UE and LE support.  Requries mod A to sit EOB x10 minutes.  Exercise  General Exercises - Lower Extremity Long Arc Quad: Strengthening;AAROM;Both;10 reps End of Session PT - End of Session Activity Tolerance: Patient limited by fatigue Patient left: in bed;with call bell in reach  Delia Slatten 02/16/2011, 4:18 PM

## 2011-02-16 NOTE — ED Notes (Signed)
Pt tolerating p.o intake.  Able to swallow fluids with no difficulty.

## 2011-02-16 NOTE — H&P (Signed)
PCP:  He is currently seeing no physician.  Chief Complaint:  Frequent falls, and feeling weak.  HPI: Zachary Fowler is an 65 y.o. male, with history of severe active alcohol abuse, brought in to the ER by his daughter as he has more frequent falls.  He was falling today and she wasn't able to get him up.  He drinks significantly according to her daughter, though he denied drinking that much.  He had fallen about a week ago, and had a laceration on his right supraorbital ridge.  He denied any chest pain or shortness of breath.  He has no nausea, vomitting or abdominal pain.  Evaluation in the ER showed a KCl level of 2.4 along with hypochlroremia.  Hospitalist was asked to admit him for further treatment.  The degree of alcohol withdraw currently is minimal.    Past Medical History  Diagnosis Date  . Hypertension     History reviewed. No pertinent past surgical history.  Medications:  HOME MEDS: Prior to Admission medications   Not on File    PRIOR TO AMDISSION MEDS Medications Prior to Admission  Medication Dose Route Frequency Provider Last Rate Last Dose  . dextrose 5 % and 0.9 % NaCl with KCl 40 mEq/L infusion   Intravenous Continuous Houston Siren      . enoxaparin (LOVENOX) injection 40 mg  40 mg Subcutaneous Q24H Houston Siren      . folic acid (FOLVITE) tablet 1 mg  1 mg Oral Daily Houston Siren      . LORazepam (ATIVAN) 2 MG/ML injection 1 mg  1 mg Intravenous Once Joya Gaskins, MD   1 mg at 02/16/11 0136  . LORazepam (ATIVAN) tablet 1 mg  1 mg Oral Q6H PRN Houston Siren       Or  . LORazepam (ATIVAN) 2 MG/ML injection 1 mg  1 mg Intravenous Q6H PRN Houston Siren      . LORazepam (ATIVAN) tablet 0-4 mg  0-4 mg Oral Q6H Cannen Dupras       Followed by  . LORazepam (ATIVAN) tablet 0-4 mg  0-4 mg Oral Q12H Houston Siren      . multivitamins ther. w/minerals tablet 1 tablet  1 tablet Oral Daily Juwaun Inskeep      . ondansetron (ZOFRAN) tablet 4 mg  4 mg Oral Q6H PRN Houston Siren       Or  . ondansetron (ZOFRAN)  injection 4 mg  4 mg Intravenous Q6H PRN Houston Siren      . potassium chloride 10 mEq in 100 mL IVPB  10 mEq Intravenous Once Joya Gaskins, MD 100 mL/hr at 02/16/11 0205 10 mEq at 02/16/11 0205  . potassium chloride 10 mEq in 100 mL IVPB  10 mEq Intravenous Q1 Hr x 4 Laurrie Toppin      . sodium chloride 0.9 % bolus 1,000 mL  1,000 mL Intravenous Once Joya Gaskins, MD   1,000 mL at 02/16/11 0136  . vitamin B-1 tablet 100 mg  100 mg Oral Daily Houston Siren       Or  . thiamine (B-1) injection 100 mg  100 mg Intravenous Daily Houston Siren      . DISCONTD: potassium chloride (KLOR-CON) packet 40 mEq  40 mEq Oral BID Joya Gaskins, MD   40 mEq at 02/16/11 0205   No current outpatient prescriptions on file as of 02/16/2011.    Allergies:  No Known Allergies  Social History:   reports that  he has been smoking.  He does not have any smokeless tobacco history on file. He reports that he drinks alcohol. He reports that he does not use illicit drugs.  He lives at home with his daughter.  Family History: History reviewed. No pertinent family history.  Rewiew of Systems:  The patient denies anorexia, fever, weight loss,, vision loss, decreased hearing, hoarseness, chest pain, syncope, dyspnea on exertion, peripheral edema,  hemoptysis, abdominal pain, melena, hematochezia, severe indigestion/heartburn, hematuria, incontinence, genital sores, muscle weakness, suspicious skin lesions, transient blindness, difficulty walking, depression, unusual weight change, abnormal bleeding, enlarged lymph nodes, angioedema, and breast masses.   Physical Exam: Filed Vitals:   02/16/11 0049 02/16/11 0255  BP: 128/79 119/86  Pulse: 110 106  Temp: 98.2 F (36.8 C) 97.7 F (36.5 C)  TempSrc:  Oral  Resp: 22 20  Height: 5\' 10"  (1.778 m)   Weight: 83.915 kg (185 lb)   SpO2: 100% 100%   Blood pressure 119/86, pulse 106, temperature 97.7 F (36.5 C), temperature source Oral, resp. rate 20, height 5\' 10"  (1.778 m),  weight 83.915 kg (185 lb), SpO2 100.00%.  GEN: person lying in the stretcher in no acute distress; cooperative with exam PSYCH: He is alert and oriented x4; does not appear anxious does not appear depressed; affect is normal HEENT: Mucous membranes pink and anicteric; PERRLA; EOM intact; no cervical lymphadenopathy nor thyromegaly or carotid bruit; no JVD; laceration well healed on right supraorbital ridge. Breasts:: Not examined CHEST WALL: No tenderness CHEST: Normal respiration, clear to auscultation bilaterally HEART: Regular rate and rhythm; no murmurs rubs or gallops BACK: No kyphosis or scoliosis; no CVA tenderness ABDOMEN: Obese, soft non-tender; no masses, no organomegaly, normal abdominal bowel sounds; no pannus; no intertriginous candida. Rectal Exam: Not done EXTREMITIES: No bone or joint deformity; age-appropriate arthropathy of the hands and knees; no edema; no ulcerations. Genitalia: not examined PULSES: 2+ and symmetric SKIN: Normal hydration no rash or ulceration CNS: Cranial nerves 2-12 grossly intact no focal neurologic deficit   Labs & Imaging Results for orders placed during the hospital encounter of 02/16/11 (from the past 48 hour(s))  BASIC METABOLIC PANEL     Status: Abnormal   Collection Time   02/16/11  1:15 AM      Component Value Range Comment   Sodium 131 (*) 135 - 145 (mEq/L)    Potassium 2.4 (*) 3.5 - 5.1 (mEq/L)    Chloride 83 (*) 96 - 112 (mEq/L)    CO2 27  19 - 32 (mEq/L)    Glucose, Bld 141 (*) 70 - 99 (mg/dL)    BUN 21  6 - 23 (mg/dL)    Creatinine, Ser 1.61  0.50 - 1.35 (mg/dL)    Calcium 8.9  8.4 - 10.5 (mg/dL)    GFR calc non Af Amer 88 (*) >90 (mL/min)    GFR calc Af Amer >90  >90 (mL/min)   CBC     Status: Abnormal   Collection Time   02/16/11  1:15 AM      Component Value Range Comment   WBC 6.0  4.0 - 10.5 (K/uL)    RBC 3.37 (*) 4.22 - 5.81 (MIL/uL)    Hemoglobin 13.2  13.0 - 17.0 (g/dL)    HCT 09.6 (*) 04.5 - 52.0 (%)    MCV 109.5  (*) 78.0 - 100.0 (fL)    MCH 39.2 (*) 26.0 - 34.0 (pg)    MCHC 35.8  30.0 - 36.0 (g/dL)    RDW  13.4  11.5 - 15.5 (%)    Platelets 140 (*) 150 - 400 (K/uL)   DIFFERENTIAL     Status: Abnormal   Collection Time   02/16/11  1:15 AM      Component Value Range Comment   Neutrophils Relative 85 (*) 43 - 77 (%)    Neutro Abs 5.2  1.7 - 7.7 (K/uL)    Lymphocytes Relative 8 (*) 12 - 46 (%)    Lymphs Abs 0.5 (*) 0.7 - 4.0 (K/uL)    Monocytes Relative 7  3 - 12 (%)    Monocytes Absolute 0.4  0.1 - 1.0 (K/uL)    Eosinophils Relative 0  0 - 5 (%)    Eosinophils Absolute 0.0  0.0 - 0.7 (K/uL)    Basophils Relative 0  0 - 1 (%)    Basophils Absolute 0.0  0.0 - 0.1 (K/uL)    Dg Chest Portable 1 View  02/16/2011  *RADIOLOGY REPORT*  Clinical Data: Shortness of breath.  PORTABLE CHEST - 1 VIEW  Comparison: 03/08/2005  Findings: Heart and mediastinal contours are within normal limits. No focal opacities or effusions.  No acute bony abnormality.  IMPRESSION: No active cardiopulmonary disease.  Original Report Authenticated By: Cyndie Chime, M.D.      Assessment Present on Admission:  .Alcoholism /alcohol abuse .Hypokalemia .Ataxia .Falling episodes   PLAN:  Will admit him to telemetry.  He will need Dextrose and KCL.  Need to check Mag also.  He will likely going to have withdrawal, and CIWA with Ativan was given.  I will check his LFTs, B12, TSH, testosterone level, PT/INR.   He will need a CT of his head to r/out a subdural or epidural hematoma.  Will admit him to telemetry.  He will need major inpatient intervention, I think, if he is to have any chance of quitting alcohol abuse in the future.  Discussed with him and his daughter about our therapeutic goals, and all questions were answered.   Other plans as per orders.   Teshaun Olarte 02/16/2011, 3:11 AM

## 2011-02-16 NOTE — ED Notes (Addendum)
Pt arrived via EMS.  Pt reports nausea, vomiting, and diarrhea x1 week.  Pt states that he has been unable to eat.  Per EMS, family reports that he is a chronic ETOH abuser.  Pt reports that he does drink daily, but has not had anything in 2 days.  Presently pt reporting nausea, and showing some tremors in right hand.

## 2011-02-17 ENCOUNTER — Inpatient Hospital Stay (HOSPITAL_COMMUNITY): Payer: Medicare Other

## 2011-02-17 ENCOUNTER — Encounter (HOSPITAL_COMMUNITY): Payer: Self-pay | Admitting: Internal Medicine

## 2011-02-17 DIAGNOSIS — K7010 Alcoholic hepatitis without ascites: Secondary | ICD-10-CM | POA: Diagnosis present

## 2011-02-17 DIAGNOSIS — E871 Hypo-osmolality and hyponatremia: Secondary | ICD-10-CM

## 2011-02-17 DIAGNOSIS — D696 Thrombocytopenia, unspecified: Secondary | ICD-10-CM

## 2011-02-17 DIAGNOSIS — I609 Nontraumatic subarachnoid hemorrhage, unspecified: Secondary | ICD-10-CM | POA: Diagnosis present

## 2011-02-17 DIAGNOSIS — D649 Anemia, unspecified: Secondary | ICD-10-CM

## 2011-02-17 DIAGNOSIS — S065XAA Traumatic subdural hemorrhage with loss of consciousness status unknown, initial encounter: Secondary | ICD-10-CM | POA: Diagnosis present

## 2011-02-17 DIAGNOSIS — D510 Vitamin B12 deficiency anemia due to intrinsic factor deficiency: Secondary | ICD-10-CM | POA: Diagnosis present

## 2011-02-17 DIAGNOSIS — E291 Testicular hypofunction: Secondary | ICD-10-CM

## 2011-02-17 DIAGNOSIS — S065X9A Traumatic subdural hemorrhage with loss of consciousness of unspecified duration, initial encounter: Secondary | ICD-10-CM

## 2011-02-17 HISTORY — DX: Testicular hypofunction: E29.1

## 2011-02-17 HISTORY — DX: Traumatic subdural hemorrhage with loss of consciousness of unspecified duration, initial encounter: S06.5X9A

## 2011-02-17 HISTORY — DX: Thrombocytopenia, unspecified: D69.6

## 2011-02-17 HISTORY — DX: Hypo-osmolality and hyponatremia: E87.1

## 2011-02-17 HISTORY — DX: Other disorders of phosphorus metabolism: E83.39

## 2011-02-17 HISTORY — DX: Anemia, unspecified: D64.9

## 2011-02-17 HISTORY — DX: Hypomagnesemia: E83.42

## 2011-02-17 LAB — CBC
HCT: 30.7 % — ABNORMAL LOW (ref 39.0–52.0)
MCHC: 35.2 g/dL (ref 30.0–36.0)
MCV: 110 fL — ABNORMAL HIGH (ref 78.0–100.0)
RDW: 13.2 % (ref 11.5–15.5)
WBC: 3.3 10*3/uL — ABNORMAL LOW (ref 4.0–10.5)

## 2011-02-17 LAB — BASIC METABOLIC PANEL
BUN: 8 mg/dL (ref 6–23)
Chloride: 96 mEq/L (ref 96–112)
Creatinine, Ser: 0.54 mg/dL (ref 0.50–1.35)
GFR calc Af Amer: >90 mL/min (ref >90)

## 2011-02-17 LAB — TESTOSTERONE, FREE: Testosterone, Free: 9.9 pg/mL — ABNORMAL LOW (ref 47.0–244.0)

## 2011-02-17 LAB — TESTOSTERONE, % FREE: Testosterone-% Free: 0.9 % — ABNORMAL LOW (ref 1.6–2.9)

## 2011-02-17 MED ORDER — TESTOSTERONE 5 MG/24HR TD PT24: 1.0000 | MEDICATED_PATCH | Freq: Every day | TRANSDERMAL | Status: DC

## 2011-02-17 MED ORDER — SODIUM CHLORIDE 0.9 % IV SOLN
INTRAVENOUS | Status: DC
  Administered 2011-02-17 (×2): via INTRAVENOUS
  Filled 2011-02-17 (×6): qty 1000

## 2011-02-17 MED ORDER — POTASSIUM CHLORIDE CRYS ER 20 MEQ PO TBCR
40.0000 meq | EXTENDED_RELEASE_TABLET | Freq: Two times a day (BID) | ORAL | Status: DC
  Administered 2011-02-17 – 2011-02-18 (×3): 40 meq via ORAL
  Filled 2011-02-17 (×3): qty 2

## 2011-02-17 NOTE — Progress Notes (Signed)
Physical Therapy Treatment Patient Details Name: Zachary Fowler MRN: [id number] DOB: 06/29/1945 Today's Date: 02/17/2011  NWGN:[id number]: 1 TE 1 TA  PT Assessment/Plan  PT - Assessment/Plan Comments on Treatment Session: Pt much improved over yesterday's note;able to sit EOB +10 mins with UE support during seated exercises, also able to complete 10 reps of  UE exercises maintaining sitting dynamic balance;pt was Mod A sit<>stand with RW due to weakness and unsteadiness;pt became nauseous after x3 and needed to lay down PT Goals  Acute Rehab PT Goals PT Goal: Sit at Edge Of Bed - Progress: Met PT Transfer Goal: Sit to Stand/Stand to Sit - Progress: Met PT Goal: Perform Home Exercise Program - Progress: Progressing toward goal  PT Treatment Precautions/Restrictions  Restrictions Weight Bearing Restrictions: Yes RLE Weight Bearing: Touchdown weight bearing LLE Weight Bearing: Touchdown weight bearing Mobility (including Balance) Bed Mobility Sitting - Scoot to Edge of Bed: 4: Min assist Sitting - Scoot to [place] of Bed Details (indicate cue type and reason): VCs for weight shifting and assistance progressing LE forward Sit to Supine - Right: 4: Min assist Sit to Supine - Right Details (indicate cue type and reason): assistance with balance  Transfers Transfers: Yes Sit to Stand: 3: Mod assist Sit to Stand Details (indicate cue type and reason): RW for support,from elevated surface;VCs for hand placement and standing upright with pelvis over feet;assistance also needed for balance during ascent Stand to Sit: 3: Mod assist Stand to Sit Details: VCs to reach back to control descent;assistance needed to control descent as well Ambulation/Gait Ambulation/Gait: No Stairs: No Wheelchair Mobility Wheelchair Mobility: No  Posture/Postural Control Postural Limitations: pt tends to have R lateral lean at times however today able to sit EOB +10 with UE support and 20 secs x2 for UE exercises;no  LOB  Static Sitting Balance Static Sitting - Comment/# of Minutes: Patient able to sit EOB +10 mins today during LE exercises with UE support;;tendency to have R lateral lean Exercise  General Exercises - Upper Extremity Shoulder Flexion: Both;10 reps Shoulder Horizontal ABduction: Both;10 reps General Exercises - Lower Extremity Ankle Circles/Pumps: Both;20 reps Heel Slides: Both;10 reps Hip ABduction/ADduction: Both;10 reps Hip Flexion/Marching: Both (1 min;EOB) Toe Raises: Both;20 reps Heel Raises: Both (20 reps) Other Exercises Other Exercises: sit <>stand x3 End of Session PT - End of Session Equipment Utilized During Treatment: Gait belt (RW) Activity Tolerance: Patient limited by fatigue;Treatment limited secondary to medical complications (Comment) (nauseous) Patient left: in bed;with call bell in reach;with bed alarm set;with family/visitor present (nursing present) Nurse Communication: Mobility status for transfers General Behavior During Session: [facility] for tasks performed Cognition: [facility] for tasks performed  Janique Hoefer ATKINSO 02/17/2011, 10:09 AM

## 2011-02-17 NOTE — Progress Notes (Signed)
Subjective: The patient has no complaints of chest pain, shortness of breath, or abdominal pain. He does have a light frontal headache.  Objective: Vital signs in last 24 hours: Filed Vitals:   02/17/11 0100 02/17/11 0315 02/17/11 0500 02/17/11 1032  BP: 133/77 122/78 112/67 110/67  Pulse: 85 90 78 95  Temp: 97.6 F (36.4 C)  98.4 F (36.9 C) 98.3 F (36.8 C)  TempSrc: Oral  Oral Oral  Resp: 20  16 18   Height:      Weight:      SpO2: 98%  98% 98%    Intake/Output Summary (Last 24 hours) at 02/17/11 1148 Last data filed at 02/17/11 0100  Gross per 24 hour  Intake    120 ml  Output    502 ml  Net   -382 ml    Weight change:    Exam: Lungs: Clear anteriorly with decreased breath sounds in the bases. Heart: S1, S2, with a soft systolic murmur. Abdomen: Positive bowel sounds, soft, nontender, nondistended. Extremities: No pedal edema. Pedal pulses palpable. Neurologic/psychological: Her and oriented x2. No tremulousness. His affect. Bilateral hand grip. Gait not assessed, waiting for PT.  Lab Results: Basic Metabolic Panel:  Basename 02/17/11 0454 02/16/11 0708 02/16/11 0115  NA 134* 133* --  K 2.9* 3.2* --  CL 96 90* --  CO2 32 28 --  GLUCOSE 129* 110* --  BUN 8 20 --  CREATININE 0.54 0.73 --  CALCIUM 8.5 8.4 --  MG -- -- 1.4*  PHOS -- 1.0* --   Liver Function Tests:  [facility] 02/16/11 0708  AST 66*  ALT 23  ALKPHOS 82  BILITOT 1.5*  PROT 7.4  ALBUMIN 2.6*   No results found for this basename: LIPASE:2,AMYLASE:2 in the last 72 hours No results found for this basename: AMMONIA:2 in the last 72 hours CBC:  Basename 02/17/11 0454 02/16/11 0708 02/16/11 0115  WBC 3.3* 4.8 --  NEUTROABS -- -- 5.2  HGB 10.8* 11.8* --  HCT 30.7* 33.2* --  MCV 110.0* 111.0* --  PLT 95* 120* --   Cardiac Enzymes:  Basename 02/16/11 0708  CKTOTAL 22  CKMB --  CKMBINDEX --  TROPONINI --   BNP: No results found for this basename: POCBNP:3 in the last 72  hours D-Dimer: No results found for this basename: DDIMER:2 in the last 72 hours CBG: No results found for this basename: GLUCAP:6 in the last 72 hours Hemoglobin A1C: No results found for this basename: HGBA1C in the last 72 hours Fasting Lipid Panel: No results found for this basename: CHOL,HDL,LDLCALC,TRIG,CHOLHDL,LDLDIRECT in the last 72 hours Thyroid Function Tests:  Basename 02/16/11 0708  TSH 1.055  T4TOTAL --  FREET4 --  T3FREE --  THYROIDAB --   Anemia Panel:  Basename 02/16/11 0708  VITAMINB12 434  FOLATE 2.2*  FERRITIN --  TIBC --  IRON --  RETICCTPCT --   Urine Drug Screen:  Alcohol Level: No results found for this basename: ETH:2 in the last 72 hours Urinalysis:  Misc. Labs:   Micro: No results found for this or any previous visit (from the past 240 hour(s)).  Studies/Results: Ct Head Wo Contrast  02/17/2011  *RADIOLOGY REPORT*  Clinical Data: Subdural hematoma, follow up  CT HEAD WITHOUT CONTRAST  Technique:  Contiguous axial images were obtained from the base of the skull through the vertex without contrast.  Comparison: 02/16/2011  Findings: Generalized atrophy. Normal ventricular morphology. No midline shift or hydrocephalus. Tiny right frontoparietal subdural hematoma 4 mm  thick, not significantly changed. No additional intracranial hemorrhage, mass lesion or evidence of acute infarction. Partial opacification of ethmoid air cells. Visualized paranasal sinuses otherwise clear. No acute osseous findings.  IMPRESSION: Stable appearance of tiny subdural hematoma right frontoparietal region.  Original Report Authenticated By: Lollie Marrow, M.D.   Ct Head Wo Contrast  02/16/2011  *RADIOLOGY REPORT*  Clinical Data: Larey Seat 10 days ago, striking the head with laceration above the right eye and orbital bruising.  CT HEAD WITHOUT CONTRAST  Technique:  Contiguous axial images were obtained from the base of the skull through the vertex without contrast.   Comparison: MRI brain 04/29/2008  Findings: Technically limited study due to motion artifact.  Mild cerebral atrophy.  Suggestion of a small isodense subdural fluid collection along the right frontoparietal region measuring about 5 mm maximum depth.  This may represent a small subacute subdural hematoma.  No significant mass effect or midline shift.  No intraparenchymal or subarachnoid hemorrhage visualized.  Mild ventricular dilatation consistent with central atrophy.  Gray-white matter junctions are distinct.  Basal cisterns are not effaced. Opacification of some the left ethmoid air cells.  No depressed skull fractures.  IMPRESSION: Suggestion of small subacute subdural hematoma along the right frontoparietal region.  No significant mass effect or midline shift.  No evidence of acute infarct or mass lesion.  Original Report Authenticated By: Marlon Pel, M.D.   Dg Chest Portable 1 View  02/16/2011  *RADIOLOGY REPORT*  Clinical Data: Shortness of breath.  PORTABLE CHEST - 1 VIEW  Comparison: 03/08/2005  Findings: Heart and mediastinal contours are within normal limits. No focal opacities or effusions.  No acute bony abnormality.  IMPRESSION: No active cardiopulmonary disease.  Original Report Authenticated By: Cyndie Chime, M.D.    Medications: I have reviewed the patient's current medications.  Assessment: Principal Problem:  *Alcoholism /alcohol abuse Active Problems:  Hypokalemia  Ataxia  Falling episodes  Thrombocytopenia  Anemia  Hyponatremia  Hypomagnesemia  Hypophosphatemia  Subdural hematoma  Hypogonadism male   1. Frequent falls and ataxia, likely secondary to the effects of chronic alcoholism electrolyte abnormalities.  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 Vitamin therapy.  Alcoholism/alcohol abuse. I informed the patient that his alcohol abuse is the culprit of most of not all of his symptoms and lab abnormalities. I strongly encouraged him to stop drinking and to seek  help from Alcoholics Anonymous.  Mild alcohol withdrawal syndrome. He is on the CIWA protocol.  All subdural hematoma, likely the consequence of frequent falls. The followup CT is stable.  Macrocytic anemia and thrombocytopenia, likely secondary to alcoholism.  Electrolyte abnormalities including hypokalemia, hypomagnesemia, hyponatremia, and hypophosphatemia. Status post IV magnesium sulfate. Status post IV sodium phosphate. And status post IV potassium. His serum potassium is still low.  Hypogonadism. The patient's testosterone level is quite low. Mild hepatic transaminitis, secondary to alcoholic hepatitis.   Plan: The IV fluids have been changed to normal saline with potassium chloride added. We will add potassium chloride orally. We'll check his potassium chloride, magnesium level, and phosphorus level in the morning.   PT consult noted. The patient will likely need skilled nursing facility placement.  Social work consult for alcoholism.  We'll start testosterone replacement.    LOS: 1 day   Talli Kimmer 02/17/2011, 11:48 AM

## 2011-02-18 DIAGNOSIS — R269 Unspecified abnormalities of gait and mobility: Secondary | ICD-10-CM | POA: Diagnosis present

## 2011-02-18 DIAGNOSIS — E538 Deficiency of other specified B group vitamins: Secondary | ICD-10-CM | POA: Diagnosis present

## 2011-02-18 LAB — CBC
HCT: 32.9 % — ABNORMAL LOW (ref 39.0–52.0)
Hemoglobin: 11.6 g/dL — ABNORMAL LOW (ref 13.0–17.0)
MCH: 39.7 pg — ABNORMAL HIGH (ref 26.0–34.0)
MCHC: 35.3 g/dL (ref 30.0–36.0)
MCV: 112.7 fL — ABNORMAL HIGH (ref 78.0–100.0)

## 2011-02-18 LAB — MAGNESIUM: Magnesium: 1.6 mg/dL (ref 1.5–2.5)

## 2011-02-18 LAB — BASIC METABOLIC PANEL
BUN: 3 mg/dL — ABNORMAL LOW (ref 6–23)
CO2: 33 mEq/L — ABNORMAL HIGH (ref 19–32)
Calcium: 9.1 mg/dL (ref 8.4–10.5)
Creatinine, Ser: 0.52 mg/dL (ref 0.50–1.35)

## 2011-02-18 MED ORDER — POTASSIUM CHLORIDE IN NACL 20-0.9 MEQ/L-% IV SOLN
INTRAVENOUS | Status: DC
  Administered 2011-02-18 – 2011-02-19 (×2): via INTRAVENOUS

## 2011-02-18 MED ORDER — SODIUM PHOSPHATE 3 MMOLE/ML IV SOLN
20.0000 mmol | Freq: Once | INTRAVENOUS | Status: AC
  Administered 2011-02-18: 20 mmol via INTRAVENOUS
  Filled 2011-02-18: qty 6.67

## 2011-02-18 MED ORDER — POTASSIUM CHLORIDE CRYS ER 20 MEQ PO TBCR
40.0000 meq | EXTENDED_RELEASE_TABLET | Freq: Every day | ORAL | Status: DC
  Administered 2011-02-19: 40 meq via ORAL
  Filled 2011-02-18: qty 2

## 2011-02-18 MED ORDER — MAGNESIUM OXIDE 400 MG PO TABS
400.0000 mg | ORAL_TABLET | Freq: Two times a day (BID) | ORAL | Status: DC
  Administered 2011-02-18 – 2011-02-22 (×9): 400 mg via ORAL
  Filled 2011-02-18 (×9): qty 1

## 2011-02-18 MED ORDER — POTASSIUM CHLORIDE 2 MEQ/ML IV SOLN
INTRAVENOUS | Status: DC
  Administered 2011-02-18: 15:00:00 via INTRAVENOUS

## 2011-02-18 NOTE — Progress Notes (Signed)
Subjective: He has no complaints except discomfort above the right eye.  Objective: Vital signs in last 24 hours: Filed Vitals:   02/18/11 0300 02/18/11 0400 02/18/11 0553 02/18/11 1042  BP: 128/80 120/78 115/72 127/87  Pulse: 74 78 74 75  Temp:   97.6 F (36.4 C) 97.5 F (36.4 C)  TempSrc:   Oral Oral  Resp:   20 20  Height:      Weight:      SpO2:   98% 97%    Intake/Output Summary (Last 24 hours) at 02/18/11 1415 Last data filed at 02/18/11 1200  Gross per 24 hour  Intake   2152 ml  Output   1625 ml  Net    527 ml    Weight change:    Exam: Head/Face: Small amount of ecchymosis right forehead; non bleeding laceration over right eye. Conjunctiva clear and sclera white. EOMI. Lungs: Clear anteriorly with decreased breath sounds in the bases. Heart: S1, S2, with a soft systolic murmur. Abdomen: Positive bowel sounds, soft, nontender, nondistended. Extremities: No pedal edema. Pedal pulses palpable. Neurologic/psychological: Alert and oriented x2. No tremulousness. Pleasant affect. Good bilateral hand grip; lifts legs against gravity 45 degrees.  PT assessment noted from yesterday.  Lab Results: Basic Metabolic Panel:  Basename 02/18/11 0710 02/17/11 0454 02/16/11 0708 02/16/11 0115  NA 134* 134* -- --  K 4.4 2.9* -- --  CL 97 96 -- --  CO2 33* 32 -- --  GLUCOSE 97 129* -- --  BUN 3* 8 -- --  CREATININE 0.52 0.54 -- --  CALCIUM 9.1 8.5 -- --  MG 1.6 -- -- 1.4*  PHOS 0.7* -- 1.0* --   Liver Function Tests:  [facility] 02/16/11 0708  AST 66*  ALT 23  ALKPHOS 82  BILITOT 1.5*  PROT 7.4  ALBUMIN 2.6*   No results found for this basename: LIPASE:2,AMYLASE:2 in the last 72 hours No results found for this basename: AMMONIA:2 in the last 72 hours CBC:  Basename 02/18/11 0710 02/17/11 0454 02/16/11 0115  WBC 4.0 3.3* --  NEUTROABS -- -- 5.2  HGB 11.6* 10.8* --  HCT 32.9* 30.7* --  MCV 112.7* 110.0* --  PLT 112* 95* --   Cardiac Enzymes:  Basename  02/16/11 0708  CKTOTAL 22  CKMB --  CKMBINDEX --  TROPONINI --   BNP: No results found for this basename: POCBNP:3 in the last 72 hours D-Dimer: No results found for this basename: DDIMER:2 in the last 72 hours CBG: No results found for this basename: GLUCAP:6 in the last 72 hours Hemoglobin A1C: No results found for this basename: HGBA1C in the last 72 hours Fasting Lipid Panel: No results found for this basename: CHOL,HDL,LDLCALC,TRIG,CHOLHDL,LDLDIRECT in the last 72 hours Thyroid Function Tests:  Basename 02/16/11 0708  TSH 1.055  T4TOTAL --  FREET4 --  T3FREE --  THYROIDAB --   Anemia Panel:  Basename 02/16/11 0708  VITAMINB12 434  FOLATE 2.2*  FERRITIN --  TIBC --  IRON --  RETICCTPCT --   Urine Drug Screen:  Alcohol Level: No results found for this basename: ETH:2 in the last 72 hours Urinalysis:  Misc. Labs:   Micro: No results found for this or any previous visit (from the past 240 hour(s)).  Studies/Results: Ct Head Wo Contrast  02/17/2011  *RADIOLOGY REPORT*  Clinical Data: Subdural hematoma, follow up  CT HEAD WITHOUT CONTRAST  Technique:  Contiguous axial images were obtained from the base of the skull through the vertex without  contrast.  Comparison: 02/16/2011  Findings: Generalized atrophy. Normal ventricular morphology. No midline shift or hydrocephalus. Tiny right frontoparietal subdural hematoma 4 mm thick, not significantly changed. No additional intracranial hemorrhage, mass lesion or evidence of acute infarction. Partial opacification of ethmoid air cells. Visualized paranasal sinuses otherwise clear. No acute osseous findings.  IMPRESSION: Stable appearance of tiny subdural hematoma right frontoparietal region.  Original Report Authenticated By: Lollie Marrow, M.D.    Medications: I have reviewed the patient's current medications.  Assessment: Principal Problem:  *Alcoholism /alcohol abuse Active Problems:  Hypokalemia  Ataxia   Falling episodes  Thrombocytopenia  Anemia  Hyponatremia  Hypomagnesemia  Hypophosphatemia  Subdural hematoma  Hypogonadism male  Alcoholic hepatitis  Folate deficiency  Functional gait disorder   1. Frequent falls/gait disorder and ataxia, likely secondary to the effects of chronic alcoholism and  electrolyte abnormalities.  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 Vitamin therapy given.  Alcoholism/alcohol abuse. I informed the patient that his alcohol abuse is the culprit of most if not all of his symptoms and lab abnormalities. I strongly encouraged him to stop drinking and to seek help from Alcoholics Anonymous.  Mild alcohol withdrawal syndrome. He is on the CIWA protocol. Resolving.  Small subdural hematoma, likely the consequence of frequent falls. The followup CT is stable.  Macrocytic anemia and thrombocytopenia, likely secondary to alcoholism.  Folate deficiency. On Folate supplement.  Electrolyte abnormalities including hypokalemia, hypomagnesemia, hyponatremia, and hypophosphatemia. Status post IV magnesium sulfate. Status post IV sodium phosphate. And status post IV potassium. His potassium and magnesium are better; he will need more phosphorous.  Hypogonadism. The patient's testosterone level is quite low. Pharmacy does not have testosterone replacement therapy.  Mild hepatic transaminitis, secondary to alcoholic hepatitis.   Plan:  IV phosphorous repletion IV, per Pharmacy. Decrease IVF. Start oral magnesium oxide.    LOS: 2 days   Jennie Bolar 02/18/2011, 2:15 PM

## 2011-02-18 NOTE — Progress Notes (Signed)
MEDICATION RELATED CONSULT NOTE - INITIAL   Pharmacy Consult for Phosporous Replacement  No Known Allergies  Patient Measurements: Height: 5\' 10"  (177.8 cm) Weight: 159 lb 2.8 oz (72.2 kg) IBW/kg (Calculated) : 73   Vital Signs: Temp: 97.6 F (36.4 C) (10/20 0553) Temp src: Oral (10/20 0553) BP: 115/72 mmHg (10/20 0553) Pulse Rate: 74  (10/20 0553) Intake/Output from previous day: 10/19 0701 - 10/20 0700 In: 1792 [P.O.:240; I.V.:1552] Out: 1075 [Urine:1075] Intake/Output from this shift: Total I/O In: 360 [P.O.:360] Out: 250 [Urine:250]  Labs:  [facility] 02/18/11 0710 02/17/11 0454 02/16/11 0708 02/16/11 0115  WBC 4.0 3.3* 4.8 --  HGB 11.6* 10.8* 11.8* --  HCT 32.9* 30.7* 33.2* --  PLT 112* 95* 120* --  APTT -- -- -- --  CREATININE 0.52 0.54 0.73 --  LABCREA -- -- -- --  CREATININE 0.52 0.54 0.73 --  CREAT24HRUR -- -- -- --  MG 1.6 -- -- 1.4*  PHOS 0.7* -- 1.0* --  ALBUMIN -- -- 2.6* --  PROT -- -- 7.4 --  ALBUMIN -- -- 2.6* --  AST -- -- 66* --  ALT -- -- 23 --  ALKPHOS -- -- 82 --  BILITOT -- -- 1.5* --  BILIDIR -- -- 0.9* --  IBILI -- -- 0.6 --   Estimated Creatinine Clearance: 95.3 ml/min (by C-G formula based on Cr of 0.52).   Microbiology: No results found for this or any previous visit (from the past 720 hour(s)).  Medical History: Past Medical History  Diagnosis Date  . Hypertension   . Thrombocytopenia 02/17/2011  . Anemia 02/17/2011  . Hyponatremia 02/17/2011  . Hypomagnesemia 02/17/2011  . Hypophosphatemia 02/17/2011  . Subdural hematoma 02/17/2011  . Hypogonadism male 02/17/2011    Low testosterone level.    Medications:  Scheduled:    . folic acid  1 mg Oral Daily  . LORazepam  0-4 mg Oral Q6H   Followed by  . LORazepam  0-4 mg Oral Q12H  . multivitamins ther. w/minerals  1 tablet Oral Daily  . potassium chloride  40 mEq Oral BID  . sodium phosphate  Dextrose 5% IVPB  20 mmol Intravenous Once  . testosterone  1 patch  Transdermal Daily  . vitamin B-1  100 mg Oral Daily   Or  . thiamine  100 mg Intravenous Daily    Assessment: Okay for Protocol  Goal of Therapy:  Therapeutic Phosphorous Levels  Plan:  20 mMol Na Phos over six hours. Check phos level in am.  Lamonte Richer R 02/18/2011,9:50 AM

## 2011-02-19 LAB — CBC
MCH: 38.9 pg — ABNORMAL HIGH (ref 26.0–34.0)
Platelets: 125 10*3/uL — ABNORMAL LOW (ref 150–400)
RBC: 3.01 MIL/uL — ABNORMAL LOW (ref 4.22–5.81)
WBC: 5.9 10*3/uL (ref 4.0–10.5)

## 2011-02-19 LAB — URIC ACID: Uric Acid, Serum: 2.6 mg/dL — ABNORMAL LOW (ref 4.0–7.8)

## 2011-02-19 LAB — BASIC METABOLIC PANEL
Calcium: 9 mg/dL (ref 8.4–10.5)
GFR calc non Af Amer: >90 mL/min (ref >90)
Glucose, Bld: 104 mg/dL — ABNORMAL HIGH (ref 70–99)

## 2011-02-19 MED ORDER — TESTOSTERONE CYPIONATE 200 MG/ML IM SOLN
200.0000 mg | Freq: Once | INTRAMUSCULAR | Status: AC
  Administered 2011-02-19: 200 mg via INTRAMUSCULAR
  Filled 2011-02-19: qty 1

## 2011-02-19 NOTE — Progress Notes (Signed)
Patient on Tele, Dr. Sherrie Mustache called and said she could be discontinued from tele

## 2011-02-19 NOTE — Progress Notes (Signed)
MEDICATION RELATED CONSULT NOTE  Pharmacy Consult for Phosporous Replacement  No Known Allergies  Patient Measurements: Height: 5\' 10"  (177.8 cm) Weight: 159 lb 2.8 oz (72.2 kg) IBW/kg (Calculated) : 73   Vital Signs: Temp: 98.2 F (36.8 C) (10/21 0539) Temp src: Oral (10/21 0539) BP: 129/75 mmHg (10/21 0539) Pulse Rate: 46  (10/21 0539) Intake/Output from previous day: 10/20 0701 - 10/21 0700 In: 1845 [P.O.:360; I.V.:1485] Out: 1850 [Urine:1850] Intake/Output from this shift:    Labs: BMET    Component Value Date/Time   NA 129* 02/19/2011 0723   K 5.0 02/19/2011 0723   CL 94* 02/19/2011 0723   CO2 30 02/19/2011 0723   GLUCOSE 104* 02/19/2011 0723   BUN 3* 02/19/2011 0723   CREATININE 0.55 02/19/2011 0723   CALCIUM 9.0 02/19/2011 0723   GFRNONAA >90 02/19/2011 0723   GFRAA >90 02/19/2011 0723    Basename 02/19/11 0723 02/18/11 0710 02/17/11 0454  WBC 5.9 4.0 3.3*  HGB 11.7* 11.6* 10.8*  HCT 33.5* 32.9* 30.7*  PLT 125* 112* 95*  APTT -- -- --  CREATININE 0.55 0.52 0.54  LABCREA -- -- --  CREATININE 0.55 0.52 0.54  CREAT24HRUR -- -- --  MG -- 1.6 --  PHOS 2.1* 0.7* --  ALBUMIN -- -- --  PROT -- -- --  ALBUMIN -- -- --  AST -- -- --  ALT -- -- --  ALKPHOS -- -- --  BILITOT -- -- --  BILIDIR -- -- --  IBILI -- -- --   Estimated Creatinine Clearance: 95.3 ml/min (by C-G formula based on Cr of 0.55).   Microbiology: No results found for this or any previous visit (from the past 720 hour(s)).  Medical History: Past Medical History  Diagnosis Date  . Hypertension   . Thrombocytopenia 02/17/2011  . Anemia 02/17/2011  . Hyponatremia 02/17/2011  . Hypomagnesemia 02/17/2011  . Hypophosphatemia 02/17/2011  . Subdural hematoma 02/17/2011  . Hypogonadism male 02/17/2011    Low testosterone level.   Assessment: Phos level = 2.1 this am  Goal of Therapy:  Therapeutic Phosphorous Levels  Plan:  Discussed with MD. No further doses today. Repeat  level in am.  Mady Gemma 02/19/2011,9:49 AM

## 2011-02-19 NOTE — Progress Notes (Signed)
Discussed testosterone therapy with Dr. Sherrie Mustache.  The testosterone patch is currently not stocked in our pharmacy.  Hypogonadism, Male a) The recommended dose of testosterone enanthate (Delatestryl(R)) or testosterone cypionate is 50 to 400 mg IM every 2 to 4 weeks as replacement therapy  Doses above 400 mg per month are generally not required and injections are usually not administered more than every 2 weeks. b) Several dosage regimens of testosterone enanthate were compared in the treatment of primary hypogonadism in 23 men. The patients received either testosterone enanthate 100 milligrams weekly, 200 milligrams every 2 weeks, 300 milligrams every 3 weeks, or 400 milligrams every 4 weeks for 12 to 16 weeks. The 200 and 300 milligram regimens appeared to be the most effective in terms of suppression of the serum LH concentration and infrequency of administration   Will give Testosterone 200mg  IM x 1 today and stop the patch order as discussed with MD.

## 2011-02-19 NOTE — Progress Notes (Signed)
Subjective: No new complaints. He continues to be puzzled about the effects that alcohol has had on his health and the reported laboratory abnormalities.  Objective: Vital signs in last 24 hours: Filed Vitals:   02/18/11 1456 02/18/11 1730 02/18/11 2157 02/19/11 0539  BP: 116/78 108/74 120/81 129/75  Pulse: 77 86 84 46  Temp: 97.8 F (36.6 C) 97.4 F (36.3 C) 98 F (36.7 C) 98.2 F (36.8 C)  TempSrc: Oral Oral Oral Oral  Resp: 20 20 20 20   Height:      Weight:      SpO2: 98% 99% 99% 99%    Intake/Output Summary (Last 24 hours) at 02/19/11 1420 Last data filed at 02/19/11 0636  Gross per 24 hour  Intake   1485 ml  Output   1300 ml  Net    185 ml    Weight change:    Exam: Head/Face: Small amount of ecchymosis right forehead; non bleeding laceration over right eye. Conjunctiva clear and sclera white. EOMI. Lungs: Clear anteriorly with decreased breath sounds in the bases. Heart: S1, S2, with a soft systolic murmur. Abdomen: Positive bowel sounds, soft, nontender, nondistended. Extremities: No pedal edema. Pedal pulses palpable. Neurologic/psychological: Alert and oriented x2. No tremulousness. Pleasant affect.  Lifts legs against gravity 45 degrees, and with resistance by the examiner.  Lab Results: Basic Metabolic Panel:  Basename 02/19/11 0723 02/18/11 0710  NA 129* 134*  K 5.0 4.4  CL 94* 97  CO2 30 33*  GLUCOSE 104* 97  BUN 3* 3*  CREATININE 0.55 0.52  CALCIUM 9.0 9.1  MG -- 1.6  PHOS 2.1* 0.7*   Liver Function Tests: No results found for this basename: AST:2,ALT:2,ALKPHOS:2,BILITOT:2,PROT:2,ALBUMIN:2 in the last 72 hours No results found for this basename: LIPASE:2,AMYLASE:2 in the last 72 hours No results found for this basename: AMMONIA:2 in the last 72 hours CBC:  Basename 02/19/11 0723 02/18/11 0710  WBC 5.9 4.0  NEUTROABS -- --  HGB 11.7* 11.6*  HCT 33.5* 32.9*  MCV 111.3* 112.7*  PLT 125* 112*   Cardiac Enzymes: No results found for this  basename: CKTOTAL:3,CKMB:3,CKMBINDEX:3,TROPONINI:3 in the last 72 hours BNP: No results found for this basename: POCBNP:3 in the last 72 hours D-Dimer: No results found for this basename: DDIMER:2 in the last 72 hours CBG: No results found for this basename: GLUCAP:6 in the last 72 hours Hemoglobin A1C: No results found for this basename: HGBA1C in the last 72 hours Fasting Lipid Panel: No results found for this basename: CHOL,HDL,LDLCALC,TRIG,CHOLHDL,LDLDIRECT in the last 72 hours Thyroid Function Tests: No results found for this basename: TSH,T4TOTAL,FREET4,T3FREE,THYROIDAB in the last 72 hours Anemia Panel: No results found for this basename: VITAMINB12,FOLATE,FERRITIN,TIBC,IRON,RETICCTPCT in the last 72 hours Urine Drug Screen:  Alcohol Level: No results found for this basename: ETH:2 in the last 72 hours Urinalysis:  Misc. Labs:   Micro: No results found for this or any previous visit (from the past 240 hour(s)).  Studies/Results: No results found.  Medications: I have reviewed the patient's current medications.  Assessment: Principal Problem:  *Alcoholism /alcohol abuse Active Problems:  Hypokalemia  Ataxia  Falling episodes  Thrombocytopenia  Anemia  Hyponatremia  Hypomagnesemia  Hypophosphatemia  Subdural hematoma  Hypogonadism male  Alcoholic hepatitis  Folate deficiency  Functional gait disorder   1. Frequent falls/gait disorder and ataxia, likely secondary to the effects of chronic alcoholism and  electrolyte abnormalities.  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 Vitamin therapy given.  Alcoholism/alcohol abuse. I informed the patient that his alcohol abuse is  the culprit of most if not all of his symptoms and lab abnormalities. I strongly encouraged him to stop drinking and to seek help from Alcoholics Anonymous.  Mild alcohol withdrawal syndrome. Resolved.  Small subdural hematoma, likely the consequence of frequent falls. The followup CT is  stable.  Macrocytic anemia and thrombocytopenia, likely secondary to alcoholism and folate deficiency.  Folate deficiency. On Folate supplement.  Electrolyte abnormalities including hypokalemia, hypomagnesemia, hyponatremia, and hypophosphatemia. Status post IV magnesium sulfate. Status post IV sodium phosphate. And status post IV potassium. He was given more sodium phosphate yesterday. His potassium is borderline elevated today. His serum sodium has fallen to 129. Magnesium oxide was started yesterday.  Hypogonadism. The patient's testosterone level is quite low. Intramuscular testosterone per pharmacy.  Mild hepatic transaminitis, secondary to alcoholic hepatitis.   Plan:  Discontinue potassium chloride supplementation. Out of bed to chair today. We'll check a uric acid level and a random serum sodium to assess the recent decrease in his sodium. Possible discharge within the next 24-48 hours.   LOS: 3 days   Zachary Fowler 02/19/2011, 2:20 PM

## 2011-02-20 LAB — BASIC METABOLIC PANEL
BUN: 5 mg/dL — ABNORMAL LOW (ref 6–23)
GFR calc Af Amer: >90 mL/min (ref >90)
GFR calc non Af Amer: >90 mL/min (ref >90)
Potassium: 4.6 mEq/L (ref 3.5–5.1)

## 2011-02-20 LAB — GLUCOSE, CAPILLARY

## 2011-02-20 NOTE — Progress Notes (Signed)
Subjective: No new complaints.  Objective: Vital signs in last 24 hours: Filed Vitals:   02/19/11 0539 02/19/11 1812 02/19/11 2208 02/20/11 0627  BP: 129/75 104/72 104/72 114/74  Pulse: 46 97 87 83  Temp: 98.2 F (36.8 C) 98.2 F (36.8 C) 98.5 F (36.9 C) 97 F (36.1 C)  TempSrc: Oral Oral Oral Oral  Resp: 20 20 20 20   Height:      Weight:      SpO2: 99% 100% 99%     Intake/Output Summary (Last 24 hours) at 02/20/11 1255 Last data filed at 02/19/11 2100  Gross per 24 hour  Intake    100 ml  Output    400 ml  Net   -300 ml    Weight change:    Exam: Head/Face: Small amount of ecchymosis right forehead; non bleeding laceration over right eye. Conjunctiva clear and sclera white. EOMI. Lungs: Clear anteriorly with decreased breath sounds in the bases. Heart: S1, S2, with a soft systolic murmur. Abdomen: Positive bowel sounds, soft, nontender, nondistended. Extremities: No pedal edema. Pedal pulses palpable. Neurologic/psychological: Alert and oriented x2. No tremulousness. Pleasant affect.  Lifts legs against gravity 45 degrees, and with resistance by the examiner.  Lab Results: Basic Metabolic Panel:  Basename 02/20/11 0715 02/19/11 0723 02/18/11 0710  NA 131* 129* --  K 4.6 5.0 --  CL 95* 94* --  CO2 29 30 --  GLUCOSE 106* 104* --  BUN 5* 3* --  CREATININE 0.61 0.55 --  CALCIUM 9.3 9.0 --  MG -- -- 1.6  PHOS 3.5 2.1* --   Liver Function Tests: No results found for this basename: AST:2,ALT:2,ALKPHOS:2,BILITOT:2,PROT:2,ALBUMIN:2 in the last 72 hours No results found for this basename: LIPASE:2,AMYLASE:2 in the last 72 hours No results found for this basename: AMMONIA:2 in the last 72 hours CBC:  Basename 02/19/11 0723 02/18/11 0710  WBC 5.9 4.0  NEUTROABS -- --  HGB 11.7* 11.6*  HCT 33.5* 32.9*  MCV 111.3* 112.7*  PLT 125* 112*   Cardiac Enzymes: No results found for this basename: CKTOTAL:3,CKMB:3,CKMBINDEX:3,TROPONINI:3 in the last 72 hours BNP: No  results found for this basename: POCBNP:3 in the last 72 hours D-Dimer: No results found for this basename: DDIMER:2 in the last 72 hours CBG: No results found for this basename: GLUCAP:6 in the last 72 hours Hemoglobin A1C: No results found for this basename: HGBA1C in the last 72 hours Fasting Lipid Panel: No results found for this basename: CHOL,HDL,LDLCALC,TRIG,CHOLHDL,LDLDIRECT in the last 72 hours Thyroid Function Tests: No results found for this basename: TSH,T4TOTAL,FREET4,T3FREE,THYROIDAB in the last 72 hours Anemia Panel: No results found for this basename: VITAMINB12,FOLATE,FERRITIN,TIBC,IRON,RETICCTPCT in the last 72 hours Urine Drug Screen:  Alcohol Level: No results found for this basename: ETH:2 in the last 72 hours Urinalysis:  Misc. Labs:   Micro: No results found for this or any previous visit (from the past 240 hour(s)).  Studies/Results: No results found.  Medications: I have reviewed the patient's current medications.  Assessment: Principal Problem:  *Alcoholism /alcohol abuse Active Problems:  Hypokalemia  Ataxia  Falling episodes  Thrombocytopenia  Anemia  Hyponatremia  Hypomagnesemia  Hypophosphatemia  Subdural hematoma  Hypogonadism male  Alcoholic hepatitis  Folate deficiency  Functional gait disorder  As per note yesterday. The social worker's note is acknowledged. We will see if he will get approved for skilled nursing facility transfer over the next 24 hours. If not, Plan B may have to be discharging the patient to home, however, his daughter does  not believe he would be safe at home alone. Continue repleting electrolytes.  Plan:  Continue current management.   LOS: 4 days   Holly Pring 02/20/2011, 12:55 PM

## 2011-02-20 NOTE — Consult Note (Signed)
[facility] notified CSW of issues with Medicare working file, prohibiting SNF to accept pt until resolved.  [name] at facility has contacted Medicare in order to resolve.  MD aware as well as family.  CSW to continue to follow.  Zachary Fowler

## 2011-02-20 NOTE — Progress Notes (Signed)
MEDICATION RELATED CONSULT NOTE  Pharmacy Consult for Phosporous Replacement  No Known Allergies  Patient Measurements: Height: 5\' 10"  (177.8 cm) Weight: 159 lb 2.8 oz (72.2 kg) IBW/kg (Calculated) : 73   Vital Signs: Temp: 97 F (36.1 C) (10/22 0627) Temp src: Oral (10/22 0627) BP: 114/74 mmHg (10/22 0627) Pulse Rate: 83  (10/22 0627) Intake/Output from previous day: 10/21 0701 - 10/22 0700 In: 100 [P.O.:100] Out: 400 [Urine:400] Intake/Output from this shift:    Labs: BMET    Component Value Date/Time   NA 131* 02/20/2011 0715   K 4.6 02/20/2011 0715   CL 95* 02/20/2011 0715   CO2 29 02/20/2011 0715   GLUCOSE 106* 02/20/2011 0715   BUN 5* 02/20/2011 0715   CREATININE 0.61 02/20/2011 0715   CALCIUM 9.3 02/20/2011 0715   GFRNONAA >90 02/20/2011 0715   GFRAA >90 02/20/2011 0715    Basename 02/20/11 0715 02/19/11 0723 02/18/11 0710  WBC -- 5.9 4.0  HGB -- 11.7* 11.6*  HCT -- 33.5* 32.9*  PLT -- 125* 112*  APTT -- -- --  CREATININE 0.61 0.55 0.52  LABCREA -- -- --  CREATININE 0.61 0.55 0.52  CREAT24HRUR -- -- --  MG -- -- 1.6  PHOS 3.5 2.1* 0.7*  ALBUMIN -- -- --  PROT -- -- --  ALBUMIN -- -- --  AST -- -- --  ALT -- -- --  ALKPHOS -- -- --  BILITOT -- -- --  BILIDIR -- -- --  IBILI -- -- --   Estimated Creatinine Clearance: 95.3 ml/min (by C-G formula based on Cr of 0.61).   Microbiology: No results found for this or any previous visit (from the past 720 hour(s)).  Medical History: Past Medical History  Diagnosis Date  . Hypertension   . Thrombocytopenia 02/17/2011  . Anemia 02/17/2011  . Hyponatremia 02/17/2011  . Hypomagnesemia 02/17/2011  . Hypophosphatemia 02/17/2011  . Subdural hematoma 02/17/2011  . Hypogonadism male 02/17/2011    Low testosterone level.   Assessment: Phos level corrected.     Plan:  Will sign off for now. Please consult if needed.   Tomi Bamberger J 02/20/2011,11:06 AM

## 2011-02-20 NOTE — Progress Notes (Signed)
UR Chart Review Completed  

## 2011-02-20 NOTE — Progress Notes (Signed)
Physical Therapy Treatment Patient Details Name: Zachary Fowler MRN: [id number] DOB: Jul 07, 1945 Today's Date: 02/20/2011  TIME:1005-1035/ 1 TE 1GT 1 TA  PT Assessment/Plan  PT - Assessment/Plan Comments on Treatment Session: Pt able to complete all EOB excercises without UE support, with some difficulty due to unsteadiness;no LOB; Pt has feeling of nausea upon sitting EOB and during ambualtion:Pt was Max a +2 with RW for use of commode and back to bed, pt needed constant cueing and PTA using physical touch to keep walker close to prevent  a  fall;upon reaching bed pt was unwilling/unable to get properly posittioned to lay down safely; Max A +2 to control his descent to bed   PT Goals  Acute Rehab PT Goals PT Goal: Sit at Edge Of Bed - Progress: Met PT Transfer Goal: Sit to Stand/Stand to Sit - Progress: Met PT Goal: Perform Home Exercise Program - Progress: Progressing toward goal  PT Treatment Precautions/Restrictions  Restrictions Weight Bearing Restrictions: Yes RLE Weight Bearing: Touchdown weight bearing LLE Weight Bearing: Touchdown weight bearing Mobility (including Balance) Bed Mobility Sitting - Scoot to Edge of Bed: 6: Modified independent (Device/Increase time) Sit to Supine - Right: 6: Modified independent (Device/Increase time) Sit to Supine - Right Details (indicate cue type and reason): HOB elevated 30 degrees use of rail Transfers Transfers: Yes Sit to Stand: 4: Min assist Sit to Stand Details (indicate cue type and reason): VCs for hand placement stand and for anterior weigght shift of pelvis over feet Stand to Sit: 3: Mod assist Stand to Sit Details: both to commode and bed;VC for hand placement;both times pt anxious to sit and was not in proper position unable/unwilling to stop himself and sat, laydowned respectively quickly and unsafely Ambulation/Gait Ambulation/Gait: Yes Ambulation/Gait Assistance: 2: Max assist Ambulation/Gait Assistance Details (indicate  cue type and reason): Max +2 with RW;verbal and tactile cues to keep walker close;very unsteady Ambulation Distance (Feet): 24 Feet (12' to commode (rest) 12' back to bed) Assistive device: Rolling walker Gait Pattern: Ataxic;Shuffle Gait velocity: slow; very unsteady Stairs: No Wheelchair Mobility Wheelchair Mobility: No  Posture/Postural Control Posture/Postural Control: Postural limitations Postural Limitations: pt unsteady with static and dynamic sitting balance but able to remain upright Exercise  General Exercises - Lower Extremity Long Arc Quad: Both;10 reps Heel Slides: Both;10 reps Straight Leg Raises: Both;10 reps Toe Raises: Both;10 reps Heel Raises: 10 reps;Both Shoulder Exercises Shoulder Flexion: Both;10 reps;Seated Other Exercises Other Exercises: Shoulder H Abd x10 End of Session PT - End of Session Equipment Utilized During Treatment: Gait belt (RW) Activity Tolerance: Patient limited by fatigue;Treatment limited secondary to medical complications (Comment) (nausea) Patient left: in bed;with call bell in reach;with bed alarm set General Behavior During Session: [facility] for tasks performed Cognition: [facility] for tasks performed  Felicity Penix ATKINSO 02/20/2011, 10:51 AM

## 2011-02-21 ENCOUNTER — Encounter (HOSPITAL_COMMUNITY): Payer: Self-pay | Admitting: Internal Medicine

## 2011-02-21 LAB — URINALYSIS, ROUTINE W REFLEX MICROSCOPIC
Leukocytes, UA: NEGATIVE
Nitrite: NEGATIVE
Specific Gravity, Urine: 1.02 (ref 1.005–1.030)
pH: 6.5 (ref 5.0–8.0)

## 2011-02-21 LAB — GLUCOSE, CAPILLARY
Glucose-Capillary: 106 mg/dL — ABNORMAL HIGH (ref 70–99)
Glucose-Capillary: 126 mg/dL — ABNORMAL HIGH (ref 70–99)

## 2011-02-21 LAB — COMPREHENSIVE METABOLIC PANEL
ALT: 27 U/L (ref 0–53)
AST: 100 U/L — ABNORMAL HIGH (ref 0–37)
Albumin: 2.7 g/dL — ABNORMAL LOW (ref 3.5–5.2)
Alkaline Phosphatase: 97 U/L (ref 39–117)
Glucose, Bld: 155 mg/dL — ABNORMAL HIGH (ref 70–99)
Potassium: 3.4 mEq/L — ABNORMAL LOW (ref 3.5–5.1)
Sodium: 128 mEq/L — ABNORMAL LOW (ref 135–145)
Total Protein: 7.3 g/dL (ref 6.0–8.3)

## 2011-02-21 LAB — URINE MICROSCOPIC-ADD ON

## 2011-02-21 MED ORDER — POTASSIUM CHLORIDE CRYS ER 20 MEQ PO TBCR
30.0000 meq | EXTENDED_RELEASE_TABLET | Freq: Once | ORAL | Status: AC
  Administered 2011-02-21: 30 meq via ORAL
  Filled 2011-02-21: qty 1

## 2011-02-21 MED ORDER — POTASSIUM CHLORIDE CRYS ER 20 MEQ PO TBCR
30.0000 meq | EXTENDED_RELEASE_TABLET | Freq: Every day | ORAL | Status: DC
  Administered 2011-02-22: 30 meq via ORAL
  Filled 2011-02-21: qty 1

## 2011-02-21 MED ORDER — SODIUM CHLORIDE 0.9 % IJ SOLN
INTRAMUSCULAR | Status: AC
  Administered 2011-02-21: 10 mL
  Filled 2011-02-21: qty 10

## 2011-02-21 MED ORDER — SODIUM CHLORIDE 0.9 % IJ SOLN
INTRAMUSCULAR | Status: AC
  Administered 2011-02-21: 3 mL
  Filled 2011-02-21: qty 3

## 2011-02-21 MED ORDER — FUROSEMIDE 10 MG/ML IJ SOLN
10.0000 mg | Freq: Two times a day (BID) | INTRAMUSCULAR | Status: DC
  Administered 2011-02-21 – 2011-02-22 (×3): 10 mg via INTRAVENOUS
  Filled 2011-02-21 (×3): qty 2

## 2011-02-21 NOTE — Progress Notes (Signed)
Pt possible going home instead of snf due to issues with insurance. Spoke to pt and his daughter concerning d/c needs pt will return home with his daughter. Arrange ahc for RN,pt, aide sw. Also arranged hospital bed from [facility] at request of daughter to be delivered when daughter calls them.

## 2011-02-21 NOTE — Progress Notes (Addendum)
Subjective: The patient has no new complaints other than he does not believe that alcohol is the reason why he has been unable to ambulate effectively over the past couple of months. He says that he has had problems ambulating for the past few years but did not necessarily seek out treatment..  Objective: Vital signs in last 24 hours: Filed Vitals:   02/20/11 2108 02/21/11 0532 02/21/11 0712 02/21/11 1300  BP: 100/66 107/69  98/62  Pulse: 90 99  114  Temp: 98.3 F (36.8 C) 98.9 F (37.2 C)  98.2 F (36.8 C)  TempSrc: Oral Oral    Resp: 24 20  16   Height:      Weight:   73.5 kg (162 lb 0.6 oz)   SpO2: 99% 99%  98%    Intake/Output Summary (Last 24 hours) at 02/21/11 1751 Last data filed at 02/21/11 1655  Gross per 24 hour  Intake    840 ml  Output    575 ml  Net    265 ml    Weight change:    Exam: Head/Face: Small amount of ecchymosis right forehead; non bleeding laceration over right eye. Conjunctiva clear and sclera white. EOMI. Lungs: Clear anteriorly with decreased breath sounds in the bases. Heart: S1, S2, with a soft systolic murmur. Abdomen: Positive bowel sounds, soft, nontender, nondistended. Extremities: No pedal edema. Pedal pulses palpable. Neurologic/psychological: Alert and oriented x2. Cranial nerves II through XII are grossly intact although the patient states that he has had slurred speech for over a year. He does have mild dysarthria but he is clearly understood. No tremulousness. Pleasant affect.  Lifts legs against gravity 45 degrees, and with resistance by the examiner. The patient was witnessed ambulating with the physical therapist. He is clearly ataxic. He was able to ambulate with great difficulty and with the assistance of the therapist and a walker.  Lab Results: Basic Metabolic Panel:  Basename 02/21/11 1309 02/20/11 0715 02/19/11 0723  NA 128* 131* --  K 3.4* 4.6 --  CL 89* 95* --  CO2 26 29 --  GLUCOSE 155* 106* --  BUN 9 5* --    CREATININE 0.66 0.61 --  CALCIUM 9.4 9.3 --  MG -- -- --  PHOS -- 3.5 2.1*   Liver Function Tests:  Basename 02/21/11 1309  AST 100*  ALT 27  ALKPHOS 97  BILITOT 0.9  PROT 7.3  ALBUMIN 2.7*   No results found for this basename: LIPASE:2,AMYLASE:2 in the last 72 hours No results found for this basename: AMMONIA:2 in the last 72 hours CBC:  Basename 02/19/11 0723  WBC 5.9  NEUTROABS --  HGB 11.7*  HCT 33.5*  MCV 111.3*  PLT 125*   Cardiac Enzymes:  Basename 02/21/11 1309  CKTOTAL 14  CKMB --  CKMBINDEX --  TROPONINI --   BNP: No results found for this basename: POCBNP:3 in the last 72 hours D-Dimer: No results found for this basename: DDIMER:2 in the last 72 hours CBG:  Basename 02/21/11 1621 02/21/11 1123 02/21/11 0721 02/20/11 2116  GLUCAP 115* 106* 102* 126*   Hemoglobin A1C: No results found for this basename: HGBA1C in the last 72 hours Fasting Lipid Panel: No results found for this basename: CHOL,HDL,LDLCALC,TRIG,CHOLHDL,LDLDIRECT in the last 72 hours Thyroid Function Tests: No results found for this basename: TSH,T4TOTAL,FREET4,T3FREE,THYROIDAB in the last 72 hours Anemia Panel: No results found for this basename: VITAMINB12,FOLATE,FERRITIN,TIBC,IRON,RETICCTPCT in the last 72 hours Urine Drug Screen:  Alcohol Level: No results found for this  basename: ETH:2 in the last 72 hours Urinalysis:  Misc. Labs:   Micro: No results found for this or any previous visit (from the past 240 hour(s)).  Studies/Results: No results found.  Medications: I have reviewed the patient's current medications.  Assessment: Principal Problem:  *Alcoholism /alcohol abuse Active Problems:  Hypokalemia  Ataxia  Falling episodes  Thrombocytopenia  Anemia  Hyponatremia  Hypomagnesemia  Hypophosphatemia  Subdural hematoma  Hypogonadism male  Alcoholic hepatitis  Folate deficiency  Functional gait disorder   1. Frequent falls/gait disorder and ataxia,  likely secondary to the effects of chronic alcoholism and  electrolyte abnormalities. It is likely that the patient may have concomitant spinal stenosis or lumbar stenosis superimposed on the alcohol  effects on his cerebellum. He wonders if he has had a stroke. I informed him that the CT of his head was negative. If he has had a stroke, I informed him that the therapy would still be the same except that I would have him on aspirin. I informed him that I would not start aspirin now because of his hematoma and high risk of recurrent falling. We will forego obtaining an MRI of his brain and refer him to neurologist, Dr. Jerre Simon, for further outpatient evaluation. The patient does not believe he has an alcohol addiction and it is likely that he will continue to drink. His daughter states that in the past, he has called a Cab to take him to the liqour store when he wanted it bad enough.  Tea-colored urine. His total CK is 14. A urinalysis is pending. We'll order an ultrasound of his kidneys in the morning.  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 Vitamin therapy given.  Alcoholism/alcohol abuse. See above in #1. I informed the patient that his alcohol abuse is the culprit of most if not all of his symptoms and lab abnormalities. I strongly encouraged him to stop drinking and to seek help from Alcoholics Anonymous.  Mild alcohol withdrawal syndrome. Resolved.  Small subdural hematoma, likely the consequence of frequent falls. The followup CT is stable.  Macrocytic anemia and thrombocytopenia, likely secondary to alcoholism and folate deficiency.  Folate deficiency. On Folate supplement.  Electrolyte abnormalities including hypokalemia, hypomagnesemia, hyponatremia, and hypophosphatemia. Status post IV magnesium sulfate. Status post IV sodium phosphate. And status post IV potassium. He was given more sodium phosphate yesterday. His potassium is borderline elevated today. His serum sodium has fallen to 128. IV  fluids were discontinued a couple of days ago. His uric acid is low so I wonder if he has an element of SIADH. We'll give a trial of IV Lasix over the next 24 hours and monitor. Will continue magnesium oxide supplementation. Will add daily potassium supplementation.  Hypogonadism. The patient's testosterone level is quite low. Intramuscular testosterone per pharmacy.  Mild hepatic transaminitis, secondary to alcoholic hepatitis.  Disposition: Apparently, there are some insurance issues and they have not been settled yet. If the patient cannot go to a skilled nursing facility tomorrow, he will go home with home health equipment and assistance. This was explained to the patient and his family.      Plan:  We'll give Lasix 10 mg IV every 12 hours times 24 hours and reassess his serum sodium in the morning. Renal ultrasound in the morning. We'll check a urinalysis today. One was obtained but there was some mix up in the lab  and therefore it was not done. We'll check for red blood cells.  Probable discharge to home tomorrow after studies ordered. He  could see a urologist as an outpatient if there is obvious microhematuria, but will await the results of the renal ultrasound.    LOS: 5 days   Zachary Fowler 02/21/2011, 5:51 PM

## 2011-02-21 NOTE — Progress Notes (Signed)
Physical Therapy Treatment Patient Details Name: DANUEL FELICETTI MRN: [id number] DOB: 1945-05-05 Today's Date: 02/21/2011  TIME: 1303-1320/ 1 GT  PT Assessment/Plan  PT - Assessment/Plan Comments on Treatment Session: During ambulation pt progressively becomes more ataxic needing more assistance;once again upon reaching bed pt tried to sit down 2 feet from bed due to exhaustion/weakness possible anxiety; very unsafe unassisted and at very high risk for falls PT Goals     PT Treatment Precautions/Restrictions  Restrictions Weight Bearing Restrictions: Yes RLE Weight Bearing: Touchdown weight bearing LLE Weight Bearing: Touchdown weight bearing Mobility (including Balance) Bed Mobility Sitting - Scoot to Edge of Bed: 6: Modified independent (Device/Increase time) Sit to Supine - Right: 4: Min assist Sit to Supine - Right Details (indicate cue type and reason): asssistance needed wtih trunk today Transfers Sit to Stand: 4: Min assist Sit to Stand Details (indicate cue type and reason): from elevated surface Stand to Sit: 3: Mod assist Stand to Sit Details: pt unable to control descent Ambulation/Gait Ambulation/Gait: Yes Ambulation/Gait Assistance: 3: Mod assist Ambulation/Gait Assistance Details (indicate cue type and reason): RW Ambulation Distance (Feet): 10 Feet Assistive device: Rolling walker Gait Pattern: Ataxic Stairs: No Wheelchair Mobility Wheelchair Mobility: No    Exercise  General Exercises - Upper Extremity Shoulder Flexion: Both;10 reps End of Session PT - End of Session Equipment Utilized During Treatment: Gait belt Activity Tolerance: Patient limited by fatigue Patient left: in bed;with call bell in reach;with bed alarm set General Behavior During Session: [facility] for tasks performed Cognition: [facility] for tasks performed  Veleka Djordjevic ATKINSO 02/21/2011, 1:36 PM

## 2011-02-22 ENCOUNTER — Inpatient Hospital Stay (HOSPITAL_COMMUNITY): Payer: Medicare Other

## 2011-02-22 LAB — BASIC METABOLIC PANEL
BUN: 8 mg/dL (ref 6–23)
Chloride: 92 mEq/L — ABNORMAL LOW (ref 96–112)
GFR calc Af Amer: >90 mL/min (ref >90)
Potassium: 3.4 mEq/L — ABNORMAL LOW (ref 3.5–5.1)

## 2011-02-22 LAB — CBC
Hemoglobin: 11.7 g/dL — ABNORMAL LOW (ref 13.0–17.0)
MCHC: 35.1 g/dL (ref 30.0–36.0)
Platelets: 220 10*3/uL (ref 150–400)
RBC: 2.99 MIL/uL — ABNORMAL LOW (ref 4.22–5.81)

## 2011-02-22 LAB — GLUCOSE, CAPILLARY: Glucose-Capillary: 118 mg/dL — ABNORMAL HIGH (ref 70–99)

## 2011-02-22 MED ORDER — MAGNESIUM OXIDE 400 MG PO TABS: 400.0000 mg | ORAL_TABLET | Freq: Two times a day (BID) | ORAL | Status: AC

## 2011-02-22 MED ORDER — THIAMINE HCL 100 MG PO TABS: 100.0000 mg | ORAL_TABLET | Freq: Every day | ORAL | Status: AC

## 2011-02-22 MED ORDER — ACETAMINOPHEN 325 MG PO TABS
650.0000 mg | ORAL_TABLET | Freq: Four times a day (QID) | ORAL | Status: DC | PRN
  Administered 2011-02-22: 650 mg via ORAL
  Filled 2011-02-22: qty 2

## 2011-02-22 MED ORDER — SODIUM CHLORIDE 0.9 % IJ SOLN
INTRAMUSCULAR | Status: AC
  Administered 2011-02-22: 10 mL
  Filled 2011-02-22: qty 10

## 2011-02-22 MED ORDER — ACETAMINOPHEN 325 MG PO TABS: 650.0000 mg | ORAL_TABLET | Freq: Four times a day (QID) | ORAL | Status: AC | PRN

## 2011-02-22 MED ORDER — TESTOSTERONE 12.5 MG/ACT (1%) TD GEL: 2.0000 | Freq: Every day | TRANSDERMAL | Status: DC

## 2011-02-22 MED ORDER — POTASSIUM CHLORIDE CRYS ER 15 MEQ PO TBCR: 30.0000 meq | EXTENDED_RELEASE_TABLET | Freq: Every day | ORAL | Status: DC

## 2011-02-22 MED ORDER — FOLIC ACID 1 MG PO TABS: 1.0000 mg | ORAL_TABLET | Freq: Every day | ORAL | Status: AC

## 2011-02-22 NOTE — Progress Notes (Signed)
Discharge Summary: pt a/o. Vss. Saline lock removed. Up with assistance x2. Discharge instructions discussed with family member. Family member verbalized understanding of instructions. Prescriptions given. Pt discharge home with home health. Left floor via wheelchair with nursing staff and family member.

## 2011-02-22 NOTE — Progress Notes (Signed)
Subjective: Right-sided headache.  Filed Vitals:   02/22/11 1507  BP: 97/65  Pulse: 85  Temp: 98.3 F (36.8 C)  Resp: 20   General: Alert, oriented, appropriate Lungs clear to auscultation bilaterally without wheeze rhonchi or rales Cardiovascular regular rate rhythm without murmurs gallops rubs Abdomen soft nontender nondistended Extremities no clubbing cyanosis or edema slight tremor.   AMBER        Appearance       CLEAR       Specific Gravity, Urine       1.020       pH       6.5       Glucose, UA       NEGATIVE       Bilirubin Urine       MODERATE       Ketones, ur       NEGATIVE       Protein, ur       TRACE       Urobilinogen, UA       1.0       Nitrite       NEGATIVE       Leukocytes, UA       NEGATIVE       Urine-Other       MUCOUS PRESENT       WBC, UA       0-2       RBC / HPF       3-6       Squamous Epithelial / LPF       RARE       Bacteria, UA       RARE        URINE DIPSTICK   Assessment and plan: Principal Problem:  *Alcoholism /alcohol abuse Active Problems:  Hypokalemia  Ataxia  Falling episodes  Thrombocytopenia  Anemia  Hyponatremia  Hypomagnesemia  Hypophosphatemia  Subdural hematoma  Hypogonadism male  Alcoholic hepatitis  Folate deficiency  Functional gait disorder   urinalysis shows only moderate bilirubin and trace hemoglobin. Renal ultrasound is pending. If negative, can go home. Discussed with patient's daughter. Discharge summary already done by Dr. Sherrie Mustache.

## 2011-02-22 NOTE — Discharge Summary (Signed)
Zachary Fowler, Zachary Fowler                ACCOUNT NO.:  [id number]  MEDICAL RECORD NO.:  [id number]  LOCATION:  A325                          FACILITY:  [facility]  PHYSICIAN:  Elliot Cousin, M.D.    DATE OF BIRTH:  1945/05/24  DATE OF ADMISSION:  02/16/2011 DATE OF DISCHARGE:  10/24/2012LH                         DISCHARGE SUMMARY-REFERRING   DISCHARGE DIAGNOSES: 1. Ataxia and functional gait disorder secondary to alcoholism,     electrolyte abnormalities, vitamin deficiency, and possibly     progressive cervical stenosis. 2. Alcoholism in a patient who is in denial. 3. Small subdural hematoma secondary to falls at home. 4. Electrolyte abnormalities including hypokalemia, hyponatremia,     hypomagnesemia, and hypophosphatemia. 5. Hypogonadism.  Testosterone replacement started. 6. Macrocytic anemia secondary to alcohol abuse. 7. Folate deficiency.  Replacement therapy started. 8. Thrombocytopenia secondary to alcohol abuse. 9. Alcoholic hepatitis. 10.Tea-colored urine, investigation pending. 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  In     2008, the MRI of the cervical spine revealed a previous C5-C6     anterior diskectomy and fusion and previous C6-C7 anterior     diskectomy and fusion with plate without significant cervical     stenosis.  DISCHARGE MEDICATIONS: 1. Thiamine 100 mg daily. 2. Folate 1 mg daily. 3. AndroGel 1% to be applied to the skin each morning. 4. Multivitamin with iron. 5. Magnesium oxide 400 mg b.i.d. 6. Potassium chloride 20 mEq 1-1/2 tablets daily.  DISCHARGE DISPOSITION:  The patient is currently in stable condition. The anticipated plan is to discharge him to home tomorrow on February 22, 2011.  If the insurance issue is settled, he could be discharged to a local skilled nursing facility.  He has an appointment to follow up with Dr. Karleen Hampshire on March 01, 2011 at 10:15 a.m.  He was advised to call [facility] to  follow up for treatment of alcohol addiction.  The phone number was given to him by the clinical social worker.  An appointment has been arranged for him to follow up with neurologist Dr. Gerilyn Pilgrim in November.  CONSULTATIONS:  Clinical Child psychotherapist and case management.  PROCEDURES PERFORMED:  Noncontrast of CT scan of his head on February 17, 2011.  The results revealed stable appearance of tiny subdural hematoma, right frontoparietal region.  No additional intracranial hemorrhage, mass lesion, or evidence of acute infarction.  HISTORY OF PRESENTING ILLNESS:  The patient is a 65 year old male with a past medical history significant for 2 cervical diskectomies and 2-3 previous lumbar laminectomies, who presented to the emergency department on December 17, 2010 with a chief complaint of weakness, difficulty walking, and falls.  In the emergency department, he was noted to be afebrile and hemodynamically stable.  LABORATORY DATA:  Significant for a serum sodium of 131, potassium of 2.4, MCV of 109, hemoglobin of 12.2, and platelet count of 140,000.  A CT scan of his head was not ordered.  His chest x-ray revealed no acute cardiopulmonary disease.  He was subsequently admitted for further evaluation and management.  HOSPITAL COURSE: 1. ATAXIA, FUNCTIONAL GAIT DISORDER, and SUBDURAL HEMATOMA SECONDARY     TO FALL.  Following the admission, Dr.  Houston Siren ordered a CT scan     of the patient's head as he had a laceration over the right eye     from a fall.  The CT revealed a tiny right-sided subdural hematoma.     He was treated accordingly with analgesics.  The patient's daughter     reported that the patient drank approximately a gallon of     alcohol/whiskey on a weekly basis.  She would buy alcohol for him     because he was increasingly unable to ambulate.  According to her     history, when she refused to go buy his alcohol, he would actually     call a cab to come pick him up to take  him to the liquor store.     Because of his history, he was started on vitamin therapy with     thiamine, folate, and multivitamin with iron.  The day following     admission, Dr. Lendell Caprice had briefly spoken to neurosurgeon Dr.     Danielle Dess at [facility] regarding the subdural hematoma.     He recommended a followup CT scan to assess for interval     findings.  The followup CT scan of the patient's head was stable.     The physical therapist was consulted for evaluation.  She     evaluated the patient multiple times during hospital course.  The     consensus was that he had a functional gait disorder, ataxia and     easy fatigability.  When I questioned the patient about his     inability to ambulate and his ataxic symptoms, he stated that he     has problems with walking for several years but his difficulty had     worsened over the past 2-3 months.  He disclosed that he had at     least 2 cervical spine operations and at least 2 or 3 lumbar spine     operations.  When I looked into this, I found that the patient did     have 2 previous cervical spine diskectomies and fusions.  He was     evaluated in 2008 for difficulty ambulating with MRI of the     cervical spine.  It revealed the previous operations and no     significant cervical stenosis.  In 2009, he was evaluated yet again     for a progressive gait disorder with MRI of his brain.  It revealed     pronounced cerebellar atrophy.  I informed the patient that it was     likely that his progressive inability to ambulate was secondary to     the effects of chronic alcohol on his cerebellum.  I also informed     him that the alcoholism had caused significant electrolyte     abnormalities and vitamin deficiency which also contributed to his     progressive ataxic gait disorder.  I explained this to the patient     on multiple occasions, however, he would not believe that alcohol     caused his symptoms.  He kept stating that all  of his other     friends drank alcohol and did not have the same problems he was     having.  Nevertheless, I decided not to rescan his head or neck.     It was decided that he would be a candidate for rehabilitation in a     skilled  nursing facility.  The plan was to discharge him to a     skilled nursing facility with an outpatient neurology evaluation     with Dr. Gerilyn Pilgrim.  However, because of insurance issues, skilled     nursing facility placement was not approved.  Now, the plan is to     discharge him to home tomorrow.  As many home health services as     as possible were ordered.  A hospital bed was ordered as well.     An appointment is being arranged for him to follow up with Dr.     Gerilyn Pilgrim in November for further evaluation of his ataxia. 2. ALCOHOLISM.  As stated above, the patient was started on vitamin     therapy.  The CIWA Ativan protocol was also initiated.  He     demonstrated only mild alcohol withdrawal symptoms.  The clinical     social worker was consulted to discuss alcoholism treatment with     him.  He continued to deny that he was an alcoholic.  Nevertheless,     he was encouraged to seek help at Alcoholics Anonymous and [facility] in [place].  According to his     daughter, he would drink up to a gallon of alcohol in a week or     less. 3. HYPOKALEMIA, HYPONATREMIA, HYPOMAGNESEMIA, AND HYPOPHOSPHATEMIA.     The patient's serum potassium level was as low as 2.4.  His sodium     level was as low as 128.  His magnesium level was as low as 1.4.     His phosphorus level was as low as 0.7.  He was repleted     intravenously and orally throughout the hospitalization.  IV fluids     with normal saline were started initially for hydration and     continued for at least 3-1/2 to 4 days.  His serum sodium did     improve, however, it began to decrease over the past 24 hours.  A     uric acid level was ordered and it was low.  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  Earlier today, he was     started on gentle Lasix at 10 mg IV every 12 hours for only 24     hours to help decrease the free water.  His diet was liberalized to     a regular diet.  Additional laboratory studies are pending in the     morning, however, overall, his electrolyte status is much improved. 4. HYPOGONADISM.  The patient's testosterone level was assessed.  His     total testosterone level was quite low at 106.  His free     testosterone percentage was only 0.9%.  Testosterone replacement     therapy was ordered with a testosterone patch.  However, neither     the transdermal testosterone nor  AndroGel were available at the     hospital pharmacy.  I discussed this with the pharmacist.     A decision was made to give the patient 200     mg of IM testosterone which should generally last for approximately     2 weeks.  The patient will be discharged on AndroGel topically     daily. 5. MACROCYTIC ANEMIA, THROMBOCYTOPENIA, and FOLATE DEFICIENCY.  The     patient's MCV was persistently elevated ranging from 109-112.  His     hemoglobin ranged from 13.2 on admission to a nadir of 10.8.  It     stabilized at 11.6-11.7.  His platelet count was 140 on admission     and decreased to a nadir of 95, and stabilized at approximately     120.  His vitamin B12 was within normal limits at 434, however, his     folate was low at 2.2.  As stated previously, he was started on     folic acid daily. 6. ALCOHOLIC HEPATITIS.  The patient's liver transaminases were     elevated, with an AST ranging from 66-100, an ALT ranging from 23-     27, and a total bilirubin of 1.5-0.9. 7. TEA-COLORED URINE.  The plan was to discharge the patient today,     however, the registered nurse noted that the patient's urine was     tea-colored.  I questioned the patient about this.  He reported     that intermittently, he had tea-colored urine, but for the  most     part, his urine was yellow.  A total CK was ordered and it was     within normal limits.  A urinalysis was ordered but the results are     pending.  A renal ultrasound will be ordered in the morning.  If     there is significant microhematuria or abnormalities with his     kidneys, these findings would warrant urological evaluation either     as an inpatient or as an outpatient.  ADDENDUM DISPOSITION:  The patient could be discharged to home tomorrow if there are no significant abnormality seen on his urinalysis and on his renal ultrasound.  If there are abnormalities that warrant a urological evaluation, this could be done as an inpatient or as an outpatient.  The plan is to discharge the patient home unless the insurance issue has been settled and the patient could go to a skilled nursing facility short-term for rehabilitation.  Home health services have been arranged.  He will follow up with Dr. Regino Schultze and Dr. Gerilyn Pilgrim as scheduled.  He will likely drink alcohol again as he is in denial about his addiction.     Elliot Cousin, M.D.     DF/MEDQ  D:  02/21/2011  T:  02/22/2011  Job:  [id number]  cc:   Kirk Ruths, M.D. Fax: [phone]  Kofi A. Gerilyn Pilgrim, M.D. Fax: [phone]

## 2011-02-22 NOTE — Progress Notes (Addendum)
Physical Therapy Treatment Patient Details Name: Zachary Fowler MRN: [id number] DOB: Sep 22, 1945 Today's Date: 02/22/2011 Time: 1345-1410 Charges: TE x 15' Gt x 8'  PT Assessment/Plan  PT - Assessment/Plan Comments on Treatment Session: When pt asked if he would like to get up and walk he reported that he could not walk and he needed mroe than one person to help him walk. Pt was min assist with ambulation with VC's for control  and to decrease speed. Pt completes bed ex w/minimal difficulty and mild lack of coordination. PT Goals  Acute Rehab PT Goals PT Goal: Sit at Edge Of Bed - Progress: Met PT Transfer Goal: Sit to Stand/Stand to Sit - Progress: Met PT Goal: Perform Home Exercise Program - Progress: Progressing toward goal  PT Treatment Precautions/Restrictions  Restrictions Weight Bearing Restrictions: Yes RLE Weight Bearing: Touchdown weight bearing LLE Weight Bearing: Touchdown weight bearing Mobility (including Balance) Bed Mobility Bed Mobility: Yes Sit to Supine - Right: 5: Supervision Sit to Supine - Right Details (indicate cue type and reason): Pt with LOB once in sitting position Transfers Transfers: Yes Sit to Stand: 4: Min assist Sit to Stand Details (indicate cue type and reason): W/VC's for safe technique Stand to Sit: 4: Min assist Stand to Sit Details: uncontrolled descent Ambulation/Gait Ambulation/Gait: Yes Ambulation/Gait Assistance: 4: Min assist Ambulation/Gait Assistance Details (indicate cue type and reason): VC's to decrease speed and increase control Ambulation Distance (Feet): 10 Feet Assistive device: Rolling walker Gait Pattern: Ataxic;Trunk flexed Gait velocity: slow with increased steadiness after VC's Stairs: No Wheelchair Mobility Wheelchair Mobility: No    Exercise  General Exercises - Lower Extremity Ankle Circles/Pumps: Both;20 reps;Supine Long Arc Quad: Both;10 reps;Seated Heel Slides: Both;10 reps;Supine Hip  ABduction/ADduction: Both;10 reps;Supine Straight Leg Raises: Both;10 reps;Supine End of Session PT - End of Session Equipment Utilized During Treatment: Gait belt Activity Tolerance: Patient tolerated treatment well Patient left: in bed;with call bell in reach Nurse Communication: Other (comment) (RN notified of headache) General Behavior During Session: [facility] for tasks performed Cognition: [facility] for tasks performed  Antonieta Iba 02/22/2011, 2:26 PM

## 2011-02-22 NOTE — Consult Note (Signed)
CSW discussed d/c options for pt following Medicare working file issue.  Pt's daughter was aware that they could pay privately for SNF but she reports they rely on pt's check to pay mortgage and other living expenses so this was not an option.  Daughter states she is at home with pt most of the day and is primary caretaker.  Pt was agreeable to d/c to his home with home health.  Pt's daughter was very tearful about situation but stated there was no other option at this point than for pt to return home if Medicare was unable to pay for SNF.  She did have some questions about applying for Medicaid which were answered and specific questions regarding her father's home, which CSW deferred to DSS.  CSW explained that if pt qualified for Medicaid at a long term facility which is what daughter was requesting, then his check would go to facility for his care.  Pt's daughter was upset with this answer as she also lives with pt and would be unable to maintain the home without his check.  Pt to be d/c home today per MD.    Karn Cassis

## 2011-02-22 NOTE — Consult Note (Signed)
CSW received call from [name] at [facility] clarifying Medicare issue.  It appears this will not be cleared up in the next few days and daughter is aware according to [facility].  CSW had notified daughter that pt would have to return home with home health if not resolved.  CM aware and home health has been set up.  CSW will sign off unless Medicare issue is completed prior to d/c.  MD also aware.    Karn Cassis

## 2012-07-24 ENCOUNTER — Inpatient Hospital Stay (HOSPITAL_COMMUNITY): Payer: Medicare HMO

## 2012-07-24 ENCOUNTER — Encounter (HOSPITAL_COMMUNITY): Payer: Self-pay | Admitting: Emergency Medicine

## 2012-07-24 ENCOUNTER — Inpatient Hospital Stay (HOSPITAL_COMMUNITY)
Admission: EM | Admit: 2012-07-24 | Discharge: 2012-07-31 | DRG: 802 | Disposition: A | Discharge status: Home Health | Payer: Medicare HMO | Attending: Family Medicine | Admitting: Family Medicine

## 2012-07-24 DIAGNOSIS — E538 Deficiency of other specified B group vitamins: Secondary | ICD-10-CM | POA: Diagnosis present

## 2012-07-24 DIAGNOSIS — I82220 Acute embolism and thrombosis of inferior vena cava: Secondary | ICD-10-CM | POA: Diagnosis present

## 2012-07-24 DIAGNOSIS — Z66 Do not resuscitate: Secondary | ICD-10-CM | POA: Diagnosis present

## 2012-07-24 DIAGNOSIS — Z8249 Family history of ischemic heart disease and other diseases of the circulatory system: Secondary | ICD-10-CM

## 2012-07-24 DIAGNOSIS — Z9119 Patient's noncompliance with other medical treatment and regimen: Secondary | ICD-10-CM

## 2012-07-24 DIAGNOSIS — R4789 Other speech disturbances: Secondary | ICD-10-CM | POA: Diagnosis present

## 2012-07-24 DIAGNOSIS — R7401 Elevation of levels of liver transaminase levels: Secondary | ICD-10-CM | POA: Diagnosis present

## 2012-07-24 DIAGNOSIS — F17200 Nicotine dependence, unspecified, uncomplicated: Secondary | ICD-10-CM | POA: Diagnosis present

## 2012-07-24 DIAGNOSIS — I10 Essential (primary) hypertension: Secondary | ICD-10-CM | POA: Diagnosis present

## 2012-07-24 DIAGNOSIS — I824Y9 Acute embolism and thrombosis of unspecified deep veins of unspecified proximal lower extremity: Secondary | ICD-10-CM | POA: Diagnosis present

## 2012-07-24 DIAGNOSIS — D7589 Other specified diseases of blood and blood-forming organs: Secondary | ICD-10-CM | POA: Diagnosis present

## 2012-07-24 DIAGNOSIS — R7402 Elevation of levels of lactic acid dehydrogenase (LDH): Secondary | ICD-10-CM | POA: Diagnosis present

## 2012-07-24 DIAGNOSIS — D696 Thrombocytopenia, unspecified: Secondary | ICD-10-CM

## 2012-07-24 DIAGNOSIS — D61818 Other pancytopenia: Principal | ICD-10-CM | POA: Diagnosis present

## 2012-07-24 DIAGNOSIS — E876 Hypokalemia: Secondary | ICD-10-CM

## 2012-07-24 DIAGNOSIS — R279 Unspecified lack of coordination: Secondary | ICD-10-CM | POA: Diagnosis present

## 2012-07-24 DIAGNOSIS — D649 Anemia, unspecified: Secondary | ICD-10-CM

## 2012-07-24 DIAGNOSIS — R270 Ataxia, unspecified: Secondary | ICD-10-CM

## 2012-07-24 DIAGNOSIS — D510 Vitamin B12 deficiency anemia due to intrinsic factor deficiency: Secondary | ICD-10-CM | POA: Diagnosis present

## 2012-07-24 DIAGNOSIS — F1020 Alcohol dependence, uncomplicated: Secondary | ICD-10-CM

## 2012-07-24 DIAGNOSIS — IMO0001 Reserved for inherently not codable concepts without codable children: Secondary | ICD-10-CM | POA: Diagnosis present

## 2012-07-24 DIAGNOSIS — D72819 Decreased white blood cell count, unspecified: Secondary | ICD-10-CM | POA: Diagnosis present

## 2012-07-24 DIAGNOSIS — N390 Urinary tract infection, site not specified: Secondary | ICD-10-CM | POA: Diagnosis present

## 2012-07-24 DIAGNOSIS — E291 Testicular hypofunction: Secondary | ICD-10-CM | POA: Diagnosis present

## 2012-07-24 DIAGNOSIS — Z91199 Patient's noncompliance with other medical treatment and regimen due to unspecified reason: Secondary | ICD-10-CM

## 2012-07-24 DIAGNOSIS — E871 Hypo-osmolality and hyponatremia: Secondary | ICD-10-CM | POA: Diagnosis present

## 2012-07-24 LAB — URINALYSIS, ROUTINE W REFLEX MICROSCOPIC
Glucose, UA: 100 mg/dL — AB
Leukocytes, UA: NEGATIVE
pH: 6 (ref 5.0–8.0)

## 2012-07-24 LAB — CBC WITH DIFFERENTIAL/PLATELET
Basophils Relative: 0 % (ref 0–1)
Eosinophils Absolute: 0 10*3/uL (ref 0.0–0.7)
HCT: 11 % — ABNORMAL LOW (ref 39.0–52.0)
Hemoglobin: 3.9 g/dL — CL (ref 13.0–17.0)
MCH: 45.3 pg — ABNORMAL HIGH (ref 26.0–34.0)
MCHC: 35.5 g/dL (ref 30.0–36.0)
Monocytes Absolute: 0.1 10*3/uL (ref 0.1–1.0)
Neutro Abs: 1.9 10*3/uL (ref 1.7–7.7)

## 2012-07-24 LAB — URINE MICROSCOPIC-ADD ON

## 2012-07-24 LAB — HEPATIC FUNCTION PANEL
Albumin: 3.1 g/dL — ABNORMAL LOW (ref 3.5–5.2)
Alkaline Phosphatase: 77 U/L (ref 39–117)
Bilirubin, Direct: 1 mg/dL — ABNORMAL HIGH (ref 0.0–0.3)
Total Bilirubin: 2.1 mg/dL — ABNORMAL HIGH (ref 0.3–1.2)

## 2012-07-24 LAB — BASIC METABOLIC PANEL
BUN: 16 mg/dL (ref 6–23)
CO2: 32 mEq/L (ref 19–32)
Chloride: 88 mEq/L — ABNORMAL LOW (ref 96–112)
Creatinine, Ser: 0.48 mg/dL — ABNORMAL LOW (ref 0.50–1.35)

## 2012-07-24 LAB — MRSA PCR SCREENING: MRSA by PCR: NEGATIVE

## 2012-07-24 LAB — MAGNESIUM: Magnesium: 1.1 mg/dL — ABNORMAL LOW (ref 1.5–2.5)

## 2012-07-24 LAB — ETHANOL: Alcohol, Ethyl (B): <11 mg/dL (ref 0–11)

## 2012-07-24 MED ORDER — DEXTROSE 5 % IV SOLN
INTRAVENOUS | Status: AC
  Filled 2012-07-24: qty 10

## 2012-07-24 MED ORDER — LORAZEPAM 2 MG/ML IJ SOLN: 1.0000 mg | Freq: Four times a day (QID) | INTRAMUSCULAR | Status: AC | PRN

## 2012-07-24 MED ORDER — IOHEXOL 300 MG/ML  SOLN
50.0000 mL | Freq: Once | INTRAMUSCULAR | Status: AC | PRN
  Administered 2012-07-24: 50 mL via ORAL

## 2012-07-24 MED ORDER — PHENOL 1.4 % MT LIQD
1.0000 | OROMUCOSAL | Status: DC | PRN
  Administered 2012-07-25 – 2012-07-27 (×2): 1 via OROMUCOSAL
  Filled 2012-07-24 (×2): qty 177

## 2012-07-24 MED ORDER — DEXTROSE 5 % IV SOLN
1.0000 g | INTRAVENOUS | Status: DC
  Administered 2012-07-24 – 2012-07-25 (×2): 1 g via INTRAVENOUS
  Filled 2012-07-24 (×5): qty 10

## 2012-07-24 MED ORDER — ADULT MULTIVITAMIN W/MINERALS CH
1.0000 | ORAL_TABLET | Freq: Every day | ORAL | Status: DC
  Administered 2012-07-24 – 2012-07-31 (×6): 1 via ORAL
  Filled 2012-07-24 (×6): qty 1

## 2012-07-24 MED ORDER — FUROSEMIDE 10 MG/ML IJ SOLN
20.0000 mg | Freq: Once | INTRAMUSCULAR | Status: AC
  Administered 2012-07-24: 20 mg via INTRAVENOUS
  Filled 2012-07-24 (×2): qty 2

## 2012-07-24 MED ORDER — THIAMINE HCL 100 MG/ML IJ SOLN: 100.0000 mg | Freq: Every day | INTRAMUSCULAR | Status: DC

## 2012-07-24 MED ORDER — ONDANSETRON HCL 4 MG PO TABS: 4.0000 mg | ORAL_TABLET | Freq: Four times a day (QID) | ORAL | Status: DC | PRN

## 2012-07-24 MED ORDER — LORAZEPAM 2 MG/ML IJ SOLN
0.0000 mg | Freq: Four times a day (QID) | INTRAMUSCULAR | Status: AC
  Administered 2012-07-25: 1 mg via INTRAVENOUS
  Filled 2012-07-24: qty 1

## 2012-07-24 MED ORDER — FLUCONAZOLE 100 MG PO TABS
400.0000 mg | ORAL_TABLET | Freq: Once | ORAL | Status: AC
  Administered 2012-07-25: 400 mg via ORAL
  Filled 2012-07-24: qty 4

## 2012-07-24 MED ORDER — MAGIC MOUTHWASH W/LIDOCAINE: 15.0000 mL | Freq: Three times a day (TID) | ORAL | Status: DC | PRN

## 2012-07-24 MED ORDER — FLUCONAZOLE 100 MG PO TABS
200.0000 mg | ORAL_TABLET | Freq: Every day | ORAL | Status: DC
  Administered 2012-07-25 – 2012-07-28 (×4): 200 mg via ORAL
  Filled 2012-07-24 (×4): qty 2

## 2012-07-24 MED ORDER — VITAMIN B-1 100 MG PO TABS
100.0000 mg | ORAL_TABLET | Freq: Every day | ORAL | Status: DC
  Administered 2012-07-24 – 2012-07-31 (×6): 100 mg via ORAL
  Filled 2012-07-24 (×6): qty 1

## 2012-07-24 MED ORDER — ACETAMINOPHEN 650 MG RE SUPP: 650.0000 mg | Freq: Four times a day (QID) | RECTAL | Status: DC | PRN

## 2012-07-24 MED ORDER — LORAZEPAM 1 MG PO TABS: 1.0000 mg | ORAL_TABLET | Freq: Four times a day (QID) | ORAL | Status: AC | PRN

## 2012-07-24 MED ORDER — FOLIC ACID 1 MG PO TABS
1.0000 mg | ORAL_TABLET | Freq: Every day | ORAL | Status: DC
  Administered 2012-07-24: 1 mg via ORAL
  Filled 2012-07-24 (×2): qty 1

## 2012-07-24 MED ORDER — POTASSIUM CHLORIDE CRYS ER 20 MEQ PO TBCR
40.0000 meq | EXTENDED_RELEASE_TABLET | Freq: Once | ORAL | Status: AC
  Administered 2012-07-24: 40 meq via ORAL
  Filled 2012-07-24: qty 2

## 2012-07-24 MED ORDER — LORAZEPAM 2 MG/ML IJ SOLN: 0.0000 mg | Freq: Two times a day (BID) | INTRAMUSCULAR | Status: AC

## 2012-07-24 MED ORDER — LIDOCAINE VISCOUS 2 % MT SOLN
20.0000 mL | Freq: Once | OROMUCOSAL | Status: AC
  Administered 2012-07-24: 20 mL via OROMUCOSAL
  Filled 2012-07-24: qty 15

## 2012-07-24 MED ORDER — IOHEXOL 300 MG/ML  SOLN
100.0000 mL | Freq: Once | INTRAMUSCULAR | Status: AC | PRN
  Administered 2012-07-24: 100 mL via INTRAVENOUS

## 2012-07-24 MED ORDER — MAGIC MOUTHWASH
10.0000 mL | Freq: Three times a day (TID) | ORAL | Status: DC | PRN
  Administered 2012-07-24 – 2012-07-25 (×2): 10 mL via ORAL
  Filled 2012-07-24 (×2): qty 5
  Filled 2012-07-24: qty 10

## 2012-07-24 MED ORDER — LIDOCAINE VISCOUS 2 % MT SOLN
5.0000 mL | Freq: Three times a day (TID) | OROMUCOSAL | Status: DC | PRN
Start: 2012-07-24 — End: 2012-07-31
  Administered 2012-07-25: 5 mL via OROMUCOSAL
  Filled 2012-07-24: qty 5

## 2012-07-24 MED ORDER — SODIUM CHLORIDE 0.9 % IV SOLN
INTRAVENOUS | Status: AC
  Administered 2012-07-24: 20:00:00 via INTRAVENOUS

## 2012-07-24 MED ORDER — ONDANSETRON HCL 4 MG/2ML IJ SOLN: 4.0000 mg | Freq: Four times a day (QID) | INTRAMUSCULAR | Status: DC | PRN

## 2012-07-24 MED ORDER — POTASSIUM CHLORIDE 10 MEQ/100ML IV SOLN
10.0000 meq | Freq: Once | INTRAVENOUS | Status: AC
  Administered 2012-07-24: 10 meq via INTRAVENOUS
  Filled 2012-07-24: qty 100

## 2012-07-24 MED ORDER — ACETAMINOPHEN 325 MG PO TABS: 650.0000 mg | ORAL_TABLET | Freq: Four times a day (QID) | ORAL | Status: DC | PRN

## 2012-07-24 NOTE — ED Notes (Signed)
CRITICAL VALUE ALERT  Critical value received: potassium 2.7  Date of notification:  07/24/2012  Time of notification:  1527  Critical value read back:yes  Nurse who received alert:  c Elanora Quin rn  MD notified (1st page):  Dr Adriana Simas  Time of first page:  1527  MD notified (2nd page):  Time of second page:  Responding MD:  Dr Adriana Simas  Time MD responded:  [phone]

## 2012-07-24 NOTE — ED Notes (Signed)
Daughters in room informed phlebotomist to tell me that pt is an alcoholic. Talked with pt. Pt states has been a daily drinker for 40 years. Last drink was last night.

## 2012-07-24 NOTE — ED Notes (Signed)
CRITICAL VALUE ALERT  Critical value received:  RBC 0.86, Hgb 3.9, Hct 11.0  Date of notification: 07/24/2012  Time of notification: 1529  Critical value read back:yes  Nurse who received alert:  Tarri Glenn RN  MD notified (1st page):  Dr Adriana Simas  Time of first page:  1529  MD notified (2nd page):  Time of second page:  Responding MD:  Dr Adriana Simas  Time MD responded: [phone]

## 2012-07-24 NOTE — ED Notes (Signed)
Per EMS pt's family and pt report generalized weakness which the family states has gradually worsened over the past few years. Pt is A&O. Pt denies pain or discomfort.

## 2012-07-24 NOTE — H&P (Signed)
[facility] History and Physical  KAL CHAIT WUJ:[id number] DOB: 01-11-1946 DOA: 07/24/2012  Referring physician: Dr. Adriana Simas EDP PCP: Kirk Ruths, MD  Specialists:  Chief Complaint: weakness  HPI: Zachary Fowler is a 67 y.o. male with a history of chronic alcoholism, who presents to the emergency room with complaints of generalized weakness. Patient reports that he has had weakness for many months/years now. This is progressively gotten worse. He has noticed a significant decline over the past 3 days. He also reports possibly having some blurry vision or the past 3 days. He does not have any chest pain but has noticed worsening shortness of breath, especially on exertion. He had episode of vomiting yesterday but did not note any hematemesis or coffee-ground emesis. His last bowel movement was apparently approximately a week ago. He has not noted any significant blood or melena. He has noted that he's been bruising more easily. He has not been started on any new medicine. He reports that he may have a upper respiratory viral infection recently. He drinks alcohol on a daily basis, drinking half a bottle of vodka daily. He does not feel that he has a problem with alcohol, but his family reports that patient drinks heavily. Patient's speech is very slow and mildly slurred. family reports that this is also a chronic finding. 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 He was evaluated in the emergency room and was noted to be pancytopenic with a hemoglobin of 3.9. He's been referred for admission.  Review of Systems: Pertinent positives as per history of present illness, otherwise negative  Past Medical History  Diagnosis Date  . Hypertension   . Thrombocytopenia 02/17/2011  . Anemia 02/17/2011  . Hyponatremia 02/17/2011  . Hypomagnesemia 02/17/2011  . Hypophosphatemia 02/17/2011  . Subdural hematoma 02/17/2011  . Hypogonadism male 02/17/2011    Low testosterone level.   Past  Surgical History  Procedure Laterality Date  . Cervical disc surgery      X2  . Lumbar disc surgery      X2   Social History:  reports that he has been smoking.  He does not have any smokeless tobacco history on file. He reports that  drinks alcohol. He reports that he does not use illicit drugs. Lives with his daughter. Spends most of the time in bed. Smokes one pack of cigarettes daily. Half a Bottle of vodka daily.  No Known Allergies  Family history: Patient's father has a history of heart disease.  Prior to Admission medications   Medication Sig Start Date End Date Taking? Authorizing Provider  benzocaine (ORAJEL) 10 % mucosal gel Use as directed 1 application in the mouth or throat as needed for pain (Ulcers in mouth).   Yes Historical Provider, MD   Physical Exam: Filed Vitals:   07/24/12 1600 07/24/12 1615 07/24/12 1720 07/24/12 1750  BP: 117/57  97/57 100/50  Pulse: 106 101 99 109  Temp:   98.2 F (36.8 C) 98.2 F (36.8 C)  TempSrc:   Oral Rectal  Resp: 18 16 17 16   SpO2: 99% 97%       General:  No acute distress, appears extremely pale, speech is slow, alert and oriented x3  Eyes: Pupils are equal round reactive to light  ENT: No pharyngeal erythema, mucous membranes are moist  Neck: Supple  Cardiovascular: S1, S2, regular rate and rhythm  Respiratory: Clear to auscultation bilaterally  Abdomen: Soft, nontender, nondistended, bowel sounds are active  Skin: Bruising in the upper extremities  Musculoskeletal: One plus edema bilaterally in lower extremities  Psychiatric: Normal affect, cooperative with exam  Neurologic: Speech is somewhat dysarthric, reportedly chronic, he generally weak, strength is equal bilaterally  Labs on Admission:  Basic Metabolic Panel:  Recent Labs Lab 07/24/12 1445  NA 132*  K 2.7*  CL 88*  CO2 32  GLUCOSE 125*  BUN 16  CREATININE 0.48*  CALCIUM 8.6   Liver Function Tests:  Recent Labs Lab 07/24/12 1445  AST  50*  ALT 33  ALKPHOS 77  BILITOT 2.1*  PROT 6.1  ALBUMIN 3.1*   No results found for this basename: LIPASE, AMYLASE,  in the last 168 hours No results found for this basename: AMMONIA,  in the last 168 hours CBC:  Recent Labs Lab 07/24/12 1445  WBC 2.6*  NEUTROABS 1.9  HGB 3.9*  HCT 11.0*  MCV 127.9*  PLT 89*   Cardiac Enzymes: No results found for this basename: CKTOTAL, CKMB, CKMBINDEX, TROPONINI,  in the last 168 hours  BNP (last 3 results) No results found for this basename: PROBNP,  in the last 8760 hours CBG: No results found for this basename: GLUCAP,  in the last 168 hours  Radiological Exams on Admission: No results found.  EKG: Independently reviewed. Sinus tachycardia without acute changes  Assessment/Plan Active Problems:   Alcoholism /alcohol abuse   Hypokalemia   Thrombocytopenia   Anemia   Hyponatremia   UTI (urinary tract infection)   1. Severe anemia, macrocytic. Likely etiology is direct bone marrow toxicity from chronic alcohol. We will send an anemia panel, check LDH and haptoglobin. Fecal occult blood test the emergency room was negative the patient has not noticed any gross bleeding. It is unlikely that his profound anemia is due to GI bleeding. This appears more of a chronic process. May need hematology input. Will also check HIV. We'll transfuse a total of 4 units of PRBCs. We will give 1 dose of Lasix after the second unit, followed by another dose after the fourth unit. Check CT of chest/abdomen/pelvis to rule out any occult malignancy. 2. Thrombocytopenia/leukopenia. Again likely secondary to alcohol abuse. 3. Alcoholism. Serum alcohol level in the hospital is negative. Will start alcohol withdrawal protocol. Discussed with patient's daughter. Patient is in denial about his alcoholism. He has not been ready to quit yet. 4. Hyponatremia. Likely due volume depletion. Will give saline and recheck. 5. Hypokalemia. Replace and check  magnesium. 6. Possible urinary tract infection. Nitrites positive and urinalysis. We'll check a urine culture and empirically start Rocephin for now. 7. Disposition. This will be admitted to step down unit due to significant abnormalities. We'll get physical therapy to see the patient.    Code Status: DO NOT RESUSCITATE, confirmed with patient Family Communication: Discussed with patient's daughter [name] over the phone Disposition Plan: pending hospital course  Time spent:  [facility] Pager [phone]  If 7PM-7AM, please contact night-coverage [email or web address] Password [facility] 07/24/2012, 6:57 PM

## 2012-07-24 NOTE — ED Provider Notes (Signed)
History     This chart was scribed for Donnetta Hutching, MD, MD by Smitty Pluck, ED Scribe. The patient was seen in room APA04/APA04 and the patient's care was started at 3:44 PM.   CSN: [id number]  Arrival date & time 07/24/12  1357     Chief Complaint  Patient presents with  . Weakness     The history is provided by the patient. No language interpreter was used.   Zachary Fowler is a 67 y.o. male who presents to the Emergency Department BIB EMS complaining of weakness that has been ongoing for the past 1.5 years but worsening the past 3 days. Pt reports that he has lesion in his mouth and he has decreased appetite. He states he is a daily alcoholic and his last drink of alcohol was 1 night ago. Pt denies fever, chills, nausea, vomiting, diarrhea, cough, SOB and any other pain.    Past Medical History  Diagnosis Date  . Hypertension   . Thrombocytopenia 02/17/2011  . Anemia 02/17/2011  . Hyponatremia 02/17/2011  . Hypomagnesemia 02/17/2011  . Hypophosphatemia 02/17/2011  . Subdural hematoma 02/17/2011  . Hypogonadism male 02/17/2011    Low testosterone level.    Past Surgical History  Procedure Laterality Date  . Cervical disc surgery      X2  . Lumbar disc surgery      X2    History reviewed. No pertinent family history.  History  Substance Use Topics  . Smoking status: Current Every Day Smoker -- 1.00 packs/day  . Smokeless tobacco: Not on file  . Alcohol Use: Yes     Comment: 1 gallon/wk      Review of Systems 10 Systems reviewed and all are negative for acute change except as noted in the HPI.   Allergies  Review of patient's allergies indicates no known allergies.  Home Medications   Current Outpatient Rx  Name  Route  Sig  Dispense  Refill  . benzocaine (ORAJEL) 10 % mucosal gel   Mouth/Throat   Use as directed 1 application in the mouth or throat as needed for pain (Ulcers in mouth).           BP 117/50  Pulse 117  Temp(Src) 98.1 F (36.7 C)  (Oral)  Resp 18  SpO2 100%  Physical Exam  Nursing note and vitals reviewed. Constitutional: He is oriented to person, place, and time. He appears well-developed and well-nourished.  Weak and frail appearing  Speech is somewhat slurred but pt is able to convey ideas.   HENT:  Head: Normocephalic and atraumatic.  Eyes: Conjunctivae and EOM are normal. Pupils are equal, round, and reactive to light.  Neck: Normal range of motion. Neck supple.  Cardiovascular: Normal rate, regular rhythm and normal heart sounds.   Pulmonary/Chest: Effort normal and breath sounds normal.  Abdominal: Soft. Bowel sounds are normal.  Musculoskeletal: Normal range of motion.  Neurological: He is alert and oriented to person, place, and time.  Skin: Skin is warm and dry. There is pallor.  Psychiatric: He has a normal mood and affect.    ED Course  Procedures (including critical care time) DIAGNOSTIC STUDIES: Oxygen Saturation is 100% on room air, normal by my interpretation.    COORDINATION OF CARE: 3:45 PM Discussed ED treatment with pt and pt agrees.      No results found. Results for orders placed during the hospital encounter of 07/24/12  CBC WITH DIFFERENTIAL      Result Value Range  WBC 2.6 (*) 4.0 - 10.5 K/uL   RBC <1.00 (*) 4.22 - 5.81 MIL/uL   Hemoglobin 3.9 (*) 13.0 - 17.0 g/dL   HCT 16.1 (*) 09.6 - 04.5 %   MCV 127.9 (*) 78.0 - 100.0 fL   MCH 45.3 (*) 26.0 - 34.0 pg   MCHC 35.5  30.0 - 36.0 g/dL   RDW 40.9 (*) 81.1 - 91.4 %   Platelets 89 (*) 150 - 400 K/uL   Neutrophils Relative 72  43 - 77 %   Lymphocytes Relative 22  12 - 46 %   Monocytes Relative 5  3 - 12 %   Eosinophils Relative 1  0 - 5 %   Basophils Relative 0  0 - 1 %   Neutro Abs 1.9  1.7 - 7.7 K/uL   Lymphs Abs 0.6 (*) 0.7 - 4.0 K/uL   Monocytes Absolute 0.1  0.1 - 1.0 K/uL   Eosinophils Absolute 0.0  0.0 - 0.7 K/uL   Basophils Absolute 0.0  0.0 - 0.1 K/uL   RBC Morphology RARE NRBCs     WBC Morphology  HYPERSEGMENTED NEUT     Smear Review PLATELET COUNT CONFIRMED BY SMEAR    BASIC METABOLIC PANEL      Result Value Range   Sodium 132 (*) 135 - 145 mEq/L   Potassium 2.7 (*) 3.5 - 5.1 mEq/L   Chloride 88 (*) 96 - 112 mEq/L   CO2 32  19 - 32 mEq/L   Glucose, Bld 125 (*) 70 - 99 mg/dL   BUN 16  6 - 23 mg/dL   Creatinine, Ser 7.82 (*) 0.50 - 1.35 mg/dL   Calcium 8.6  8.4 - 95.6 mg/dL   GFR calc non Af Amer >90  >90 mL/min   GFR calc Af Amer >90  >90 mL/min  URINALYSIS, ROUTINE W REFLEX MICROSCOPIC      Result Value Range   Color, Urine AMBER (*) YELLOW   APPearance CLEAR  CLEAR   Specific Gravity, Urine 1.020  1.005 - 1.030   pH 6.0  5.0 - 8.0   Glucose, UA 100 (*) NEGATIVE mg/dL   Hgb urine dipstick NEGATIVE  NEGATIVE   Bilirubin Urine MODERATE (*) NEGATIVE   Ketones, ur TRACE (*) NEGATIVE mg/dL   Protein, ur 30 (*) NEGATIVE mg/dL   Urobilinogen, UA 4.0 (*) 0.0 - 1.0 mg/dL   Nitrite POSITIVE (*) NEGATIVE   Leukocytes, UA NEGATIVE  NEGATIVE  HEPATIC FUNCTION PANEL      Result Value Range   Total Protein 6.1  6.0 - 8.3 g/dL   Albumin 3.1 (*) 3.5 - 5.2 g/dL   AST 50 (*) 0 - 37 U/L   ALT 33  0 - 53 U/L   Alkaline Phosphatase 77  39 - 117 U/L   Total Bilirubin 2.1 (*) 0.3 - 1.2 mg/dL   Bilirubin, Direct 1.0 (*) 0.0 - 0.3 mg/dL   Indirect Bilirubin 1.1 (*) 0.3 - 0.9 mg/dL  ETHANOL      Result Value Range   Alcohol, Ethyl (B) <11  0 - 11 mg/dL  URINE MICROSCOPIC-ADD ON      Result Value Range   WBC, UA 0-2  <3 WBC/hpf   RBC / HPF 0-2  <3 RBC/hpf   Bacteria, UA RARE  RARE  TYPE AND SCREEN      Result Value Range   ABO/RH(D) A POS     Antibody Screen NEG     Sample Expiration 07/27/2012  Unit Number [id number]     Blood Component Type RED CELLS,LR     Unit division 00     Status of Unit ISSUED     Transfusion Status OK TO TRANSFUSE     Crossmatch Result Compatible     Unit Number [id number]     Blood Component Type RED CELLS,LR     Unit division 00     Status  of Unit ALLOCATED     Transfusion Status OK TO TRANSFUSE     Crossmatch Result Compatible    PREPARE RBC (CROSSMATCH)      Result Value Range   Order Confirmation ORDER PROCESSED BY BLOOD BANK    ABO/RH      Result Value Range   ABO/RH(D) A POS      No diagnosis found.   Date: 07/24/2012  Rate: 103  Rhythm: sinus tachycardia  QRS Axis: normal  Intervals: normal  ST/T Wave abnormalities: normal  Conduction Disutrbances:none  Narrative Interpretation:   Old EKG Reviewed: changes noted  CRITICAL CARE Performed by: Donnetta Hutching  ?  Total critical care time: 30  Critical care time was exclusive of separately billable procedures and treating other patients.  Critical care was necessary to treat or prevent imminent or life-threatening deterioration.  Critical care was time spent personally by me on the following activities: development of treatment plan with patient and/or surrogate as well as nursing, discussions with consultants, evaluation of patient's response to treatment, examination of patient, obtaining history from patient or surrogate, ordering and performing treatments and interventions, ordering and review of laboratory studies, ordering and review of radiographic studies, pulse oximetry and re-evaluation of patient's condition. MDM  Hemoglobin is critically low. Patient is pale. Heme-negative stool. 2 units of blood ordered in emergency department.  Patient has long history of alcohol abuse.      I personally performed the services described in this documentation, which was scribed in my presence. The recorded information has been reviewed and is accurate.    Donnetta Hutching, MD 07/24/12 1759

## 2012-07-25 DIAGNOSIS — I82220 Acute embolism and thrombosis of inferior vena cava: Secondary | ICD-10-CM

## 2012-07-25 DIAGNOSIS — D72819 Decreased white blood cell count, unspecified: Secondary | ICD-10-CM

## 2012-07-25 LAB — CBC
Hemoglobin: 7.9 g/dL — ABNORMAL LOW (ref 13.0–17.0)
RBC: 2.26 MIL/uL — ABNORMAL LOW (ref 4.22–5.81)
WBC: 2.5 10*3/uL — ABNORMAL LOW (ref 4.0–10.5)

## 2012-07-25 LAB — IRON AND TIBC
Iron: 312 ug/dL — ABNORMAL HIGH (ref 42–135)
UIBC: <15 ug/dL — ABNORMAL LOW (ref 125–400)

## 2012-07-25 LAB — COMPREHENSIVE METABOLIC PANEL
ALT: 28 U/L (ref 0–53)
Alkaline Phosphatase: 64 U/L (ref 39–117)
CO2: 33 mEq/L — ABNORMAL HIGH (ref 19–32)
Chloride: 93 mEq/L — ABNORMAL LOW (ref 96–112)
GFR calc Af Amer: >90 mL/min (ref >90)
GFR calc non Af Amer: >90 mL/min (ref >90)
Glucose, Bld: 105 mg/dL — ABNORMAL HIGH (ref 70–99)
Potassium: 3.3 mEq/L — ABNORMAL LOW (ref 3.5–5.1)
Sodium: 133 mEq/L — ABNORMAL LOW (ref 135–145)
Total Protein: 5.3 g/dL — ABNORMAL LOW (ref 6.0–8.3)

## 2012-07-25 LAB — GLUCOSE, CAPILLARY: Glucose-Capillary: 121 mg/dL — ABNORMAL HIGH (ref 70–99)

## 2012-07-25 LAB — DIRECT ANTIGLOBULIN TEST (NOT AT ARMC)
DAT, IgG: NEGATIVE
DAT, complement: NEGATIVE

## 2012-07-25 LAB — FERRITIN: Ferritin: 951 ng/mL — ABNORMAL HIGH (ref 22–322)

## 2012-07-25 LAB — URINE CULTURE: Culture: NO GROWTH

## 2012-07-25 LAB — MRSA PCR SCREENING: MRSA by PCR: NEGATIVE

## 2012-07-25 LAB — ANTITHROMBIN III: AntiThromb III Func: 58 % — ABNORMAL LOW (ref 75–120)

## 2012-07-25 LAB — PROTIME-INR: INR: 1.3 (ref 0.00–1.49)

## 2012-07-25 LAB — HIV ANTIBODY (ROUTINE TESTING W REFLEX): HIV: NONREACTIVE

## 2012-07-25 LAB — HAPTOGLOBIN: Haptoglobin: <25 mg/dL — ABNORMAL LOW (ref 45–215)

## 2012-07-25 MED ORDER — ENSURE COMPLETE PO LIQD
237.0000 mL | Freq: Two times a day (BID) | ORAL | Status: DC
  Administered 2012-07-25 – 2012-07-31 (×7): 237 mL via ORAL

## 2012-07-25 MED ORDER — BENZOCAINE 10 % MT GEL
Freq: Three times a day (TID) | OROMUCOSAL | Status: DC | PRN
  Filled 2012-07-25: qty 9.4

## 2012-07-25 MED ORDER — MAGNESIUM SULFATE 40 MG/ML IJ SOLN
4.0000 g | Freq: Once | INTRAMUSCULAR | Status: AC
  Administered 2012-07-25: 4 g via INTRAVENOUS
  Filled 2012-07-25: qty 100

## 2012-07-25 MED ORDER — CYANOCOBALAMIN 1000 MCG/ML IJ SOLN
1000.0000 ug | Freq: Every day | INTRAMUSCULAR | Status: AC
  Administered 2012-07-25 – 2012-07-27 (×3): 1000 ug via INTRAMUSCULAR
  Filled 2012-07-25 (×4): qty 1

## 2012-07-25 MED ORDER — BIOTENE DRY MOUTH MT LIQD
15.0000 mL | Freq: Two times a day (BID) | OROMUCOSAL | Status: DC
  Administered 2012-07-25 – 2012-07-31 (×10): 15 mL via OROMUCOSAL

## 2012-07-25 MED ORDER — POTASSIUM CHLORIDE CRYS ER 20 MEQ PO TBCR
40.0000 meq | EXTENDED_RELEASE_TABLET | Freq: Once | ORAL | Status: AC
  Administered 2012-07-25: 40 meq via ORAL
  Filled 2012-07-25: qty 2

## 2012-07-25 MED ORDER — FOLIC ACID 5 MG/ML IJ SOLN
1.0000 mg | Freq: Every day | INTRAMUSCULAR | Status: DC
  Administered 2012-07-25 – 2012-07-28 (×4): 1 mg via INTRAVENOUS
  Filled 2012-07-25 (×7): qty 0.2

## 2012-07-25 MED ORDER — ENOXAPARIN SODIUM 80 MG/0.8ML ~~LOC~~ SOLN
1.0000 mg/kg | Freq: Two times a day (BID) | SUBCUTANEOUS | Status: DC
  Administered 2012-07-25 – 2012-07-31 (×11): 75 mg via SUBCUTANEOUS
  Filled 2012-07-25 (×11): qty 0.8

## 2012-07-25 MED ORDER — SODIUM CHLORIDE 0.9 % IJ SOLN: 3.0000 mL | INTRAMUSCULAR | Status: DC | PRN

## 2012-07-25 MED ORDER — PHENOL 1.4 % MT LIQD
OROMUCOSAL | Status: AC
  Filled 2012-07-25: qty 177

## 2012-07-25 MED ORDER — SODIUM CHLORIDE 0.9 % IJ SOLN
10.0000 mL | INTRAMUSCULAR | Status: DC | PRN
  Administered 2012-07-25: 10 mL via INTRAVENOUS

## 2012-07-25 NOTE — Progress Notes (Signed)
[facility] PROGRESS NOTE  Zachary Fowler YQI:[id number] DOB: 1946-03-26 DOA: 07/24/2012 PCP: Kirk Ruths, MD  Assessment/Plan: 1. Profound macrocytic anemia. Status post 4 units of PRBCs. Stool Hemoccult is negative then patient does not have signs of bleeding. Anemia may likely be related to chronic alcohol abuse. Anemia panel and LDH/haptoglobin/Coombs test is currently pending. I have requested hematology to see the patient as well. 2. Alcohol abuse. Patient is in denial regarding his addiction to alcohol. He is currently on alcohol withdrawal protocol with Ativan. At this time, he does not have any signs of severe withdrawal. 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 This was discussed with hematology who has recommended that patient be started on anticoagulation with Lovenox. Hypercoagulable panel will be sent. They do not recommend placing an IVC filter. 4. Hypokalemia. Replace 5. Hypomagnesemia. Replace  6. possible urinary tract infection. Continue Rocephin until urine culture results are available. 7. Thrombocytopenia. Likely due to chronic alcohol use.  Code Status: DO NOT RESUSCITATE, confirmed with patient  Family Communication: Discussed with patient's daughter [name] over the phone  Disposition Plan: pending hospital course    Consultants:  Hematology   Procedures:   none   Antibiotics:   Rocephin 3/26   HPI/Subjective:  feeling better this morning. Denies any shortness of breath or chest pain. No signs of bleeding.  Objective: Filed Vitals:   07/25/12 0445 07/25/12 0500 07/25/12 0758 07/25/12 0800  BP: 112/62 132/64 120/80   Pulse: 82 85 85   Temp:    98.4 F (36.9 C)  TempSrc:    Oral  Resp: 20 21 18    Height:      Weight:  77.2 kg (170 lb 3.1 oz)    SpO2:  97% 99%     Intake/Output Summary (Last 24 hours) at 07/25/12 0953 Last data filed at 07/25/12 0500  Gross per 24 hour  Intake    1155 ml  Output   1651 ml  Net   -496 ml   Filed Weights   07/24/12 1900 07/25/12 0500  Weight: 74.5 kg (164 lb 3.9 oz) 77.2 kg (170 lb 3.1 oz)    Exam:   General:  no acute distress. Pallor has improved.  Cardiovascular:  S1, S2, regular rate and rhythm   Respiratory: Clear to auscultation bilaterally  Abdomen:  soft, nontender, nondistended, bowel sounds are active   Musculoskeletal:  no cyanosis or edema bilaterally   Data Reviewed: Basic Metabolic Panel:  Recent Labs Lab 07/24/12 1445 07/24/12 1932 07/25/12 0610  NA 132*  --  133*  K 2.7*  --  3.3*  CL 88*  --  93*  CO2 32  --  33*  GLUCOSE 125*  --  105*  BUN 16  --  12  CREATININE 0.48*  --  0.46*  CALCIUM 8.6  --  7.8*  MG  --  1.1*  --    Liver Function Tests:  Recent Labs Lab 07/24/12 1445 07/25/12 0610  AST 50* 48*  ALT 33 28  ALKPHOS 77 64  BILITOT 2.1* 2.5*  PROT 6.1 5.3*  ALBUMIN 3.1* 2.7*   No results found for this basename: LIPASE, AMYLASE,  in the last 168 hours No results found for this basename: AMMONIA,  in the last 168 hours CBC:  Recent Labs Lab 07/24/12 1445 07/25/12 0610  WBC 2.6* 2.5*  NEUTROABS 1.9  --   HGB 3.9* 7.9*  HCT 11.0* 21.4*  MCV  127.9* 94.7  PLT 89* 60*   Cardiac Enzymes: No results found for this basename: CKTOTAL, CKMB, CKMBINDEX, TROPONINI,  in the last 168 hours BNP (last 3 results) No results found for this basename: PROBNP,  in the last 8760 hours CBG: No results found for this basename: GLUCAP,  in the last 168 hours  Recent Results (from the past 240 hour(s))  MRSA PCR SCREENING     Status: None   Collection Time    07/24/12  7:40 PM      Result Value Range Status   MRSA by PCR NEGATIVE  NEGATIVE Final   Comment:            The GeneXpert MRSA Assay (FDA     approved for NASAL specimens     only), is one component of a     comprehensive MRSA colonization     surveillance program. It is not     intended to diagnose MRSA     infection nor  to guide or     monitor treatment for     MRSA infections.     Studies: Ct Chest W Contrast  07/24/2012  *RADIOLOGY REPORT*  Clinical Data: Severe anemia.  Evaluate for underlying malignancy. Shortness of breath.  Chronic alcoholism.  CT CHEST WITH CONTRAST,CT ABDOMEN AND PELVIS WITH CONTRAST  Technique:  Multidetector CT imaging of the chest was performed following the standard protocol during bolus administration of intravenous contrast.,Technique:  Multidetector CT imaging of the abdomen and pelvis was performed following the standard protoc  Contrast: 50mL OMNIPAQUE IOHEXOL 300 MG/ML  SOLN, OMNIPAQUE IOHEXOL 300 MG/ML  SOLN  Comparison: None.  Findings: The chest wall is unremarkable.  No supraclavicular or axillary mass or adenopathy.  There are small scattered lymph nodes.  The thyroid gland is grossly normal.  The bony thorax is intact.  No destructive bone lesions or spinal canal compromise.  The heart is normal in size.  No pericardial effusion.  No mediastinal or hilar lymphadenopathy.  Small scattered lymph nodes are noted.  The esophagus is grossly normal.  The aorta is normal in caliber.  No dissection.  Scattered atherosclerotic calcifications.  Dense coronary artery calcifications.  Examination of the lung parenchyma demonstrates no infiltrates or worrisome pulmonary lesions.  There are small bilateral pleural effusions with overlying atelectasis.  There is some fluid in the right major fissure.  IMPRESSION:  1.  Small bilateral pleural effusions with overlying atelectasis. 2.  No pulmonary lesions and no mediastinal or hilar adenopathy. 3.  Advanced coronary artery calcifications.  CT ABDOMEN/PELVIS:  The liver is unremarkable.  The gallbladder is normal.  No common bile duct dilatation.  The pancreas is normal.  The spleen is normal in size.  No focal lesions.  The adrenal glands and kidneys are unremarkable.  The stomach, duodenum, small bowel and colon are unremarkable except for  chronic-appearing inflammation of the ascending colon with fibrofatty infiltrative changes.  No mesenteric or retroperitoneal mass or adenopathy.  The aorta demonstrates moderate atherosclerotic changes.  The major branch vessels are patent.  There is thrombus in the IVC.  I do not see any definite thrombus in the iliac veins or common femoral veins. Patient is certainly at risk for pulmonary embolism.  The bladder is unremarkable.  The prostate gland and seminal vesicles appear normal.  No pelvic mass or adenopathy.  No free pelvic fluid collections.  Mild presacral edema.  The bony pelvis demonstrates evidence of remote post-traumatic changes.  Impression:  1.  Clot is noted in the IVC.  Patient at risk for pulmonary embolism. 2.  Chronic inflammatory change involving the ascending colon and terminal ileum. 3.  No mass or adenopathy.   Original Report Authenticated By: Rudie Meyer, M.D.    Ct Abdomen Pelvis W Contrast  07/24/2012  *RADIOLOGY REPORT*  Clinical Data: Severe anemia.  Evaluate for underlying malignancy. Shortness of breath.  Chronic alcoholism.  CT CHEST WITH CONTRAST,CT ABDOMEN AND PELVIS WITH CONTRAST  Technique:  Multidetector CT imaging of the chest was performed following the standard protocol during bolus administration of intravenous contrast.,Technique:  Multidetector CT imaging of the abdomen and pelvis was performed following the standard protoc  Contrast: 50mL OMNIPAQUE IOHEXOL 300 MG/ML  SOLN, OMNIPAQUE IOHEXOL 300 MG/ML  SOLN  Comparison: None.  Findings: The chest wall is unremarkable.  No supraclavicular or axillary mass or adenopathy.  There are small scattered lymph nodes.  The thyroid gland is grossly normal.  The bony thorax is intact.  No destructive bone lesions or spinal canal compromise.  The heart is normal in size.  No pericardial effusion.  No mediastinal or hilar lymphadenopathy.  Small scattered lymph nodes are noted.  The esophagus is grossly normal.  The aorta  is normal in caliber.  No dissection.  Scattered atherosclerotic calcifications.  Dense coronary artery calcifications.  Examination of the lung parenchyma demonstrates no infiltrates or worrisome pulmonary lesions.  There are small bilateral pleural effusions with overlying atelectasis.  There is some fluid in the right major fissure.  IMPRESSION:  1.  Small bilateral pleural effusions with overlying atelectasis. 2.  No pulmonary lesions and no mediastinal or hilar adenopathy. 3.  Advanced coronary artery calcifications.  CT ABDOMEN/PELVIS:  The liver is unremarkable.  The gallbladder is normal.  No common bile duct dilatation.  The pancreas is normal.  The spleen is normal in size.  No focal lesions.  The adrenal glands and kidneys are unremarkable.  The stomach, duodenum, small bowel and colon are unremarkable except for chronic-appearing inflammation of the ascending colon with fibrofatty infiltrative changes.  No mesenteric or retroperitoneal mass or adenopathy.  The aorta demonstrates moderate atherosclerotic changes.  The major branch vessels are patent.  There is thrombus in the IVC.  I do not see any definite thrombus in the iliac veins or common femoral veins. Patient is certainly at risk for pulmonary embolism.  The bladder is unremarkable.  The prostate gland and seminal vesicles appear normal.  No pelvic mass or adenopathy.  No free pelvic fluid collections.  Mild presacral edema.  The bony pelvis demonstrates evidence of remote post-traumatic changes.  Impression:  1.  Clot is noted in the IVC.  Patient at risk for pulmonary embolism. 2.  Chronic inflammatory change involving the ascending colon and terminal ileum. 3.  No mass or adenopathy.   Original Report Authenticated By: Rudie Meyer, M.D.     Scheduled Meds: . antiseptic oral rinse  15 mL Mouth Rinse BID  . cefTRIAXone (ROCEPHIN)  IV  1 g Intravenous Q24H  . cyanocobalamin  1,000 mcg Intramuscular Daily  . fluconazole  200 mg Oral Daily   . folic acid  1 mg Oral Daily  . folic acid  1 mg Intravenous Daily  . LORazepam  0-4 mg Intravenous Q6H   Followed by  . [START ON 07/26/2012] LORazepam  0-4 mg Intravenous Q12H  . multivitamin with minerals  1 tablet Oral Daily  . thiamine  100 mg Oral Daily  Or  . thiamine  100 mg Intravenous Daily   Continuous Infusions:   Active Problems:   Alcoholism /alcohol abuse   Hypokalemia   Thrombocytopenia   Anemia   Hyponatremia   UTI (urinary tract infection)    Time spent:    Jamin Panther  [facility] Pager [phone]. If 7PM-7AM, please contact night-coverage at [email or web address], password [facility] 07/25/2012, 9:53 AM  LOS: 1 day

## 2012-07-25 NOTE — Evaluation (Signed)
Physical Therapy Evaluation Patient Details Name: Zachary Fowler MRN: [id number] DOB: [birth year] Today's Date: 07/25/2012 Time: 9562-1308 PT Time Calculation (min): 48 min  PT Assessment / Plan / Recommendation Clinical Impression  Pt was seen for evaluation.  He is alert and cooperative, states that his mobility is via a power w/c.  He sleeps in a lift chair and doesn't change his clothes often because it is too much trouble.  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  He has family at home all of the time and they help him with all ADLs including transfers to the w/c.  Currently, he needed mod assist to transfer bed to chair.  Sitting balance is tenuous, needing SBA.  He reports that he had HHPT about a year ago but they only came out for a few visits and provided him with exercises to work on.  Apparently, he did not follow any of their recommendations.  I asked him if he was happy with his transfer ability and he said "no", however he would not answer me when I asked him if he was agreeable to working hard in order to develop this ability.  We will work with his transfer technique while he is in the hospital, and hope that it is close to prior functional level.  I do not believe that this pt has the self determination to work hard enough to change his current status.    PT Assessment       Follow Up Recommendations  No PT follow up    Does the patient have the potential to tolerate intense rehabilitation      Barriers to Discharge        Equipment Recommendations  None recommended by PT    Recommendations for Other Services     Frequency      Precautions / Restrictions Precautions Precautions: Fall Restrictions Weight Bearing Restrictions: No   Pertinent Vitals/Pain       Mobility  Bed Mobility Bed Mobility: Supine to Sit;Sit to Supine Supine to Sit:  4: Min assist;HOB elevated Sit to Supine: 4: Min guard;HOB elevated Details for Bed Mobility Assistance: pt sleeps in a lift chair at home Transfers Transfers: Stand Pivot Transfers Stand Pivot Transfers: 3: Mod assist Details for Transfer Assistance: pt needs some assist to get up to stance... Ambulation/Gait Ambulation/Gait Assistance: Not tested (comment) (pt is non ambulatory due to ataxia)    Exercises     PT Diagnosis:    PT Problem List:   PT Treatment Interventions:     PT Goals Acute Rehab PT Goals PT Goal Formulation: With patient Time For Goal Achievement: 08/01/12 Potential to Achieve Goals: Fair Pt will Transfer Bed to Chair/Chair to Bed: with min assist PT Transfer Goal: Bed to Chair/Chair to Bed - Progress: Goal set today  Visit Information  Last PT Received On: 07/25/12    Subjective Data  Subjective: I haven't walked much in 5 years...mainly I use a power w/c Patient Stated Goal: none stated   Prior Functioning  Home Living Lives With: Family Available Help at Discharge: Family;Available 24 hours/day Type of Home: House Home Access: Ramped entrance Home Layout: One level Bathroom Shower/Tub: Engineer, manufacturing systems: Standard Home Adaptive Equipment: Shower chair with back;Grab bars in shower;Walker - rolling;Straight cane;Wheelchair - powered Prior Function Level of Independence: Needs assistance Needs Assistance:  Bathing;Dressing;Toileting;Meal Prep;Light Housekeeping Bath: Minimal Dressing: Minimal Toileting: Moderate Meal Prep: Total Light Housekeeping: Total Able to Take Stairs?: No Driving: No Vocation: Retired Musician: No difficulties    Copywriter, advertising Overall Cognitive Status: Appears within functional limits for tasks assessed/performed Arousal/Alertness: Awake/alert Orientation Level: Appears intact for tasks assessed Behavior During Session: [facility] for tasks performed    Extremity/Trunk Assessment  Right Lower Extremity Assessment RLE ROM/Strength/Tone: WFL for tasks assessed RLE Sensation: WFL - Light Touch RLE Coordination: WFL - gross motor Left Lower Extremity Assessment LLE ROM/Strength/Tone: WFL for tasks assessed LLE Sensation: WFL - Light Touch LLE Coordination: WFL - gross motor   Balance Balance Balance Assessed: Yes Static Sitting Balance Static Sitting - Balance Support: No upper extremity supported;Feet supported Static Sitting - Level of Assistance: 5: Stand by assistance Dynamic Sitting Balance Dynamic Sitting - Balance Support: No upper extremity supported;Feet supported Dynamic Sitting - Level of Assistance: 4: Min assist  End of Session PT - End of Session Equipment Utilized During Treatment: Gait belt Activity Tolerance: Patient tolerated treatment well Patient left: in bed;with call bell/phone within reach;with bed alarm set  GP     Konrad Penta 07/25/2012, 4:41 PM

## 2012-07-25 NOTE — Consult Note (Signed)
[facility]  Name: Zachary Fowler      MRN: [id number]    Location: IC08/IC08-01  Date: 07/25/2012 Time:2:35 PM   REFERRING PHYSICIAN:  Erick Blinks, MD  REASON FOR CONSULT:  Thrombocytopenia   DIAGNOSIS:  Pancytopenia with macrocytosis and IVC thrombus  HISTORY OF PRESENT ILLNESS:   This is a 67 year old man who presented to the ED on 07/24/2012 with generalized weakness.    Zachary Fowler reports that the weakness has been present for many months and potentially years.  It has progressively worsened particularly in the last 3-4 days. This is accompanied by worsening shortness of breath. He reports emesis on 3/25 but denies any hematemesis or coffee-ground emesis.  He denies any blood in stool or black tarry stool.   On examination in the ED, he was noted to have a hgb of 3.9 with a macrocytosis of 127.9.  He was leukopenic with lymphopenia.  His platelets were also noted to be low at 89,000.  He was subsequently admitted to the hospital, ICU.  He has received 4 units of PRBCs.  His Hgb has increased appropriately to 7.9.  Macrocytosis has resolved, but platelets are down to 60,000 and WBC is stable at 2.5. Part of the patient's work-up included a CT CAP to evaluate for occult malignancy and this was negative, but did reveal a thrombus in the IVC.   CT scan reveals an unremarkable liver and normal sized spleen.  Zachary Fowler admits that he drinks 1/2 of a fifth of Vodka.  He prefers to mix it with soda, but he does find that he will drink it straight out of the bottle and chase it with something.  He reports that he has been drinking for 50 years.  He denies not feel as though he drinks too much.  He reports, "that is how I was raised."  He has been divorced x 5 times he reports.  "I could never figure out how to keep them [wife]."  I personally reviewed and went over laboratory results with the patient.  He is educated about his lab work.  He was educated on his EtOH  playing a large role in this present admission and findings.   I personally reviewed and went over radiographic studies with the patient.  He was educated about the IVC thrombus.    Otherwise, he denies any complaints.  He was resting when I entered the room.    He reports that he has spoken slow for nearly 1 and 1/2 years.   On chart review, the patient was admitted in October 2012.  He was noted to be severely folate deficient with a Folate level of 2.2.  He was discharged with folate.  He admits that he has discontinued all medications including folic acid. He denies taking any medications at home and therefore he denies any medical issues.  PAST MEDICAL HISTORY:   Past Medical History  Diagnosis Date  . Hypertension   . Thrombocytopenia 02/17/2011  . Anemia 02/17/2011  . Hyponatremia 02/17/2011  . Hypomagnesemia 02/17/2011  . Hypophosphatemia 02/17/2011  . Subdural hematoma 02/17/2011  . Hypogonadism male 02/17/2011    Low testosterone level.    ALLERGIES: No Known Allergies    MEDICATIONS: I have reviewed the patient's current medications.     PAST SURGICAL HISTORY Past Surgical History  Procedure Laterality Date  . Cervical disc surgery      X2  . Lumbar disc surgery  X2    FAMILY HISTORY: History reviewed. No pertinent family history.  SOCIAL HISTORY: He completed High school and took a college course at [employer] in business administration.  He is a retired Naval architect.  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  He is divorced x 5.  He lives with his daughter at home.  He admits to a history of marijuana smoking in the distant past.  He admits to tobacco abuse of 1 ppd x many years.  He reports drinking 1/2 of a fifth of Vodka daily.  He admits that he mixes it with soda but he has drank the EtOH out of the bottle and chased it.    PERFORMANCE STATUS: The patient's performance status is 3 - Symptomatic, >50% confined to bed  PHYSICAL  EXAM: Most Recent Vital Signs: Blood pressure 94/57, pulse 89, temperature 98.6 F (37 C), temperature source Oral, resp. rate 20, height 5\' 10"  (1.778 m), weight 170 lb 3.1 oz (77.2 kg), SpO2 97.00%. General appearance: alert, cooperative, appears older than stated age, no distress, slowed mentation and slowed speech Head: Normocephalic, without obvious abnormality, atraumatic Eyes: positive findings: small pupils with intact EOM Throat: upper dentures in place, no oral thrush, poor dentition Lungs: clear to auscultation bilaterally Heart: regular rate and rhythm, S1, S2 normal, no murmur, click, rub or gallop Abdomen: soft, non-tender; bowel sounds normal; no masses,  no organomegaly Extremities: extremities normal, atraumatic, no cyanosis or edema Skin: Skin color, texture, turgor normal. No rashes or lesions Neurologic: Grossly normal with slowed mentation and speech  LABORATORY DATA:  Results for orders placed during the hospital encounter of 07/24/12 (from the past 48 hour(s))  ETHANOL     Status: None   Collection Time    07/24/12  2:30 PM      Result Value Range   Alcohol, Ethyl (B) <11  0 - 11 mg/dL   Comment:            LOWEST DETECTABLE LIMIT FOR     SERUM ALCOHOL IS 11 mg/dL     FOR MEDICAL PURPOSES ONLY  CBC WITH DIFFERENTIAL     Status: Abnormal   Collection Time    07/24/12  2:45 PM      Result Value Range   WBC 2.6 (*) 4.0 - 10.5 K/uL   RBC <1.00 (*) 4.22 - 5.81 MIL/uL   Comment: CRITICAL RESULT CALLED TO, READ BACK BY AND VERIFIED WITH:     Zachary Fowler,C. 1529 ON 07/24/2012 BY BAUGHAM,M.   Hemoglobin 3.9 (*) 13.0 - 17.0 g/dL   Comment: CRITICAL RESULT CALLED TO, READ BACK BY AND VERIFIED WITH:     Zachary Fowler,C. 1529 ON 07/24/2012 BY BAUGHAM,M.   HCT 11.0 (*) 39.0 - 52.0 %   Comment: RESULT REPEATED AND VERIFIED   MCV 127.9 (*) 78.0 - 100.0 fL   MCH 45.3 (*) 26.0 - 34.0 pg   MCHC 35.5  30.0 - 36.0 g/dL   RDW 09.8 (*) 11.9 - 14.7 %   Platelets 89 (*) 150 - 400  K/uL   Neutrophils Relative 72  43 - 77 %   Lymphocytes Relative 22  12 - 46 %   Monocytes Relative 5  3 - 12 %   Eosinophils Relative 1  0 - 5 %   Basophils Relative 0  0 - 1 %   Neutro Abs 1.9  1.7 - 7.7 K/uL   Lymphs Abs 0.6 (*) 0.7 - 4.0 K/uL   Monocytes Absolute 0.1  0.1 - 1.0 K/uL   Eosinophils Absolute 0.0  0.0 - 0.7 K/uL   Basophils Absolute 0.0  0.0 - 0.1 K/uL   RBC Morphology RARE NRBCs     Comment: SCHISTOCYTES PRESENT (2-5/hpf)     TEARDROP CELLS     STOMATOCYTES   WBC Morphology HYPERSEGMENTED NEUT     Smear Review PLATELET COUNT CONFIRMED BY SMEAR     Comment: PLATELETS APPEAR DECREASED     LARGE PLATELETS PRESENT  BASIC METABOLIC PANEL     Status: Abnormal   Collection Time    07/24/12  2:45 PM      Result Value Range   Sodium 132 (*) 135 - 145 mEq/L   Potassium 2.7 (*) 3.5 - 5.1 mEq/L   Comment: CRITICAL RESULT CALLED TO, READ BACK BY AND VERIFIED WITH:     EDWARDS,K ON 07/24/12 AT 1525 BY LOY,C   Chloride 88 (*) 96 - 112 mEq/L   CO2 32  19 - 32 mEq/L   Glucose, Bld 125 (*) 70 - 99 mg/dL   BUN 16  6 - 23 mg/dL   Creatinine, Ser 9.60 (*) 0.50 - 1.35 mg/dL   Calcium 8.6  8.4 - 45.4 mg/dL   GFR calc non Af Amer >90  >90 mL/min   GFR calc Af Amer >90  >90 mL/min   Comment:            The eGFR has been calculated     using the CKD EPI equation.     This calculation has not been     validated in all clinical     situations.     eGFR's persistently     <90 mL/min signify     possible Chronic Kidney Disease.  HEPATIC FUNCTION PANEL     Status: Abnormal   Collection Time    07/24/12  2:45 PM      Result Value Range   Total Protein 6.1  6.0 - 8.3 g/dL   Albumin 3.1 (*) 3.5 - 5.2 g/dL   AST 50 (*) 0 - 37 U/L   ALT 33  0 - 53 U/L   Alkaline Phosphatase 77  39 - 117 U/L   Total Bilirubin 2.1 (*) 0.3 - 1.2 mg/dL   Bilirubin, Direct 1.0 (*) 0.0 - 0.3 mg/dL   Indirect Bilirubin 1.1 (*) 0.3 - 0.9 mg/dL  URINALYSIS, ROUTINE W REFLEX MICROSCOPIC     Status:  Abnormal   Collection Time    07/24/12  3:08 PM      Result Value Range   Color, Urine AMBER (*) YELLOW   Comment: BIOCHEMICALS MAY BE AFFECTED BY COLOR   APPearance CLEAR  CLEAR   Specific Gravity, Urine 1.020  1.005 - 1.030   pH 6.0  5.0 - 8.0   Glucose, UA 100 (*) NEGATIVE mg/dL   Hgb urine dipstick NEGATIVE  NEGATIVE   Bilirubin Urine MODERATE (*) NEGATIVE   Ketones, ur TRACE (*) NEGATIVE mg/dL   Protein, ur 30 (*) NEGATIVE mg/dL   Urobilinogen, UA 4.0 (*) 0.0 - 1.0 mg/dL   Nitrite POSITIVE (*) NEGATIVE   Leukocytes, UA NEGATIVE  NEGATIVE  URINE MICROSCOPIC-ADD ON     Status: None   Collection Time    07/24/12  3:08 PM      Result Value Range   WBC, UA 0-2  <3 WBC/hpf   RBC / HPF 0-2  <3 RBC/hpf   Bacteria, UA RARE  RARE  TYPE AND SCREEN  Status: None   Collection Time    07/24/12  3:50 PM      Result Value Range   ABO/RH(D) A POS     Antibody Screen NEG     Sample Expiration 07/27/2012     Unit Number [id number]     Blood Component Type RED CELLS,LR     Unit division 00     Status of Unit ISSUED,FINAL     Transfusion Status OK TO TRANSFUSE     Crossmatch Result Compatible     Unit Number [id number]     Blood Component Type RED CELLS,LR     Unit division 00     Status of Unit ISSUED     Transfusion Status OK TO TRANSFUSE     Crossmatch Result Compatible     Unit Number [id number]     Blood Component Type RED CELLS,LR     Unit division 00     Status of Unit ISSUED,FINAL     Transfusion Status OK TO TRANSFUSE     Crossmatch Result Compatible     Unit Number [id number]     Blood Component Type RED CELLS,LR     Unit division 00     Status of Unit ISSUED,FINAL     Transfusion Status OK TO TRANSFUSE     Crossmatch Result Compatible    PREPARE RBC (CROSSMATCH)     Status: None   Collection Time    07/24/12  3:50 PM      Result Value Range   Order Confirmation ORDER PROCESSED BY BLOOD BANK    ABO/RH     Status: None   Collection Time     07/24/12  3:50 PM      Result Value Range   ABO/RH(D) A POS    OCCULT BLOOD, POC DEVICE     Status: None   Collection Time    07/24/12  5:42 PM      Result Value Range   Fecal Occult Bld NEGATIVE  NEGATIVE  MAGNESIUM     Status: Abnormal   Collection Time    07/24/12  7:32 PM      Result Value Range   Magnesium 1.1 (*) 1.5 - 2.5 mg/dL  LACTATE DEHYDROGENASE     Status: Abnormal   Collection Time    07/24/12  7:32 PM      Result Value Range   LDH 659 (*) 94 - 250 U/L  MRSA PCR SCREENING     Status: None   Collection Time    07/24/12  7:40 PM      Result Value Range   MRSA by PCR NEGATIVE  NEGATIVE   Comment:            The GeneXpert MRSA Assay (FDA     approved for NASAL specimens     only), is one component of a     comprehensive MRSA colonization     surveillance program. It is not     intended to diagnose MRSA     infection nor to guide or     monitor treatment for     MRSA infections.  PREPARE RBC (CROSSMATCH)     Status: None   Collection Time    07/24/12  8:00 PM      Result Value Range   Order Confirmation ORDER PROCESSED BY BLOOD BANK    CBC     Status: Abnormal   Collection Time    07/25/12  6:10 AM  Result Value Range   WBC 2.5 (*) 4.0 - 10.5 K/uL   RBC 2.26 (*) 4.22 - 5.81 MIL/uL   Hemoglobin 7.9 (*) 13.0 - 17.0 g/dL   Comment: DELTA CHECK NOTED   HCT 21.4 (*) 39.0 - 52.0 %   MCV 94.7  78.0 - 100.0 fL   Comment: DELTA CHECK NOTED   MCH 35.0 (*) 26.0 - 34.0 pg   MCHC 36.9 (*) 30.0 - 36.0 g/dL   RDW 16.1 (*) 09.6 - 04.5 %   Platelets 60 (*) 150 - 400 K/uL   Comment: DELTA CHECK NOTED  COMPREHENSIVE METABOLIC PANEL     Status: Abnormal   Collection Time    07/25/12  6:10 AM      Result Value Range   Sodium 133 (*) 135 - 145 mEq/L   Potassium 3.3 (*) 3.5 - 5.1 mEq/L   Comment: DELTA CHECK NOTED   Chloride 93 (*) 96 - 112 mEq/L   CO2 33 (*) 19 - 32 mEq/L   Glucose, Bld 105 (*) 70 - 99 mg/dL   BUN 12  6 - 23 mg/dL   Creatinine, Ser 4.09 (*)  0.50 - 1.35 mg/dL   Calcium 7.8 (*) 8.4 - 10.5 mg/dL   Total Protein 5.3 (*) 6.0 - 8.3 g/dL   Albumin 2.7 (*) 3.5 - 5.2 g/dL   AST 48 (*) 0 - 37 U/L   ALT 28  0 - 53 U/L   Alkaline Phosphatase 64  39 - 117 U/L   Total Bilirubin 2.5 (*) 0.3 - 1.2 mg/dL   GFR calc non Af Amer >90  >90 mL/min   GFR calc Af Amer >90  >90 mL/min   Comment:            The eGFR has been calculated     using the CKD EPI equation.     This calculation has not been     validated in all clinical     situations.     eGFR's persistently     <90 mL/min signify     possible Chronic Kidney Disease.  RETICULOCYTES     Status: Abnormal   Collection Time    07/25/12  6:10 AM      Result Value Range   Retic Ct Pct 1.3  0.4 - 3.1 %   RBC. 2.26 (*) 4.22 - 5.81 MIL/uL   Retic Count, Manual 29.4  19.0 - 186.0 K/uL  DIRECT ANTIGLOBULIN TEST     Status: None   Collection Time    07/25/12  6:10 AM      Result Value Range   DAT, complement PENDING     DAT, IgG NEG    OCCULT BLOOD X 1 CARD TO LAB, STOOL     Status: None   Collection Time    07/25/12  7:18 AM      Result Value Range   Fecal Occult Bld NEGATIVE  NEGATIVE  MRSA PCR SCREENING     Status: None   Collection Time    07/25/12  7:25 AM      Result Value Range   MRSA by PCR NEGATIVE  NEGATIVE   Comment:            The GeneXpert MRSA Assay (FDA     approved for NASAL specimens     only), is one component of a     comprehensive MRSA colonization     surveillance program. It is not     intended to  diagnose MRSA     infection nor to guide or     monitor treatment for     MRSA infections.  PROTIME-INR     Status: Abnormal   Collection Time    07/25/12  9:30 AM      Result Value Range   Prothrombin Time 15.9 (*) 11.6 - 15.2 seconds   INR 1.30  0.00 - 1.49  APTT     Status: None   Collection Time    07/25/12  9:30 AM      Result Value Range   aPTT 32  24 - 37 seconds      RADIOGRAPHY: Ct Chest W Contrast  07/24/2012  *RADIOLOGY REPORT*  Clinical  Data: Severe anemia.  Evaluate for underlying malignancy. Shortness of breath.  Chronic alcoholism.  CT CHEST WITH CONTRAST,CT ABDOMEN AND PELVIS WITH CONTRAST  Technique:  Multidetector CT imaging of the chest was performed following the standard protocol during bolus administration of intravenous contrast.,Technique:  Multidetector CT imaging of the abdomen and pelvis was performed following the standard protoc  Contrast: 50mL OMNIPAQUE IOHEXOL 300 MG/ML  SOLN, OMNIPAQUE IOHEXOL 300 MG/ML  SOLN  Comparison: None.  Findings: The chest wall is unremarkable.  No supraclavicular or axillary mass or adenopathy.  There are small scattered lymph nodes.  The thyroid gland is grossly normal.  The bony thorax is intact.  No destructive bone lesions or spinal canal compromise.  The heart is normal in size.  No pericardial effusion.  No mediastinal or hilar lymphadenopathy.  Small scattered lymph nodes are noted.  The esophagus is grossly normal.  The aorta is normal in caliber.  No dissection.  Scattered atherosclerotic calcifications.  Dense coronary artery calcifications.  Examination of the lung parenchyma demonstrates no infiltrates or worrisome pulmonary lesions.  There are small bilateral pleural effusions with overlying atelectasis.  There is some fluid in the right major fissure.  IMPRESSION:  1.  Small bilateral pleural effusions with overlying atelectasis. 2.  No pulmonary lesions and no mediastinal or hilar adenopathy. 3.  Advanced coronary artery calcifications.  CT ABDOMEN/PELVIS:  The liver is unremarkable.  The gallbladder is normal.  No common bile duct dilatation.  The pancreas is normal.  The spleen is normal in size.  No focal lesions.  The adrenal glands and kidneys are unremarkable.  The stomach, duodenum, small bowel and colon are unremarkable except for chronic-appearing inflammation of the ascending colon with fibrofatty infiltrative changes.  No mesenteric or retroperitoneal mass or adenopathy.   The aorta demonstrates moderate atherosclerotic changes.  The major branch vessels are patent.  There is thrombus in the IVC.  I do not see any definite thrombus in the iliac veins or common femoral veins. Patient is certainly at risk for pulmonary embolism.  The bladder is unremarkable.  The prostate gland and seminal vesicles appear normal.  No pelvic mass or adenopathy.  No free pelvic fluid collections.  Mild presacral edema.  The bony pelvis demonstrates evidence of remote post-traumatic changes.  Impression:  1.  Clot is noted in the IVC.  Patient at risk for pulmonary embolism. 2.  Chronic inflammatory change involving the ascending colon and terminal ileum. 3.  No mass or adenopathy.   Original Report Authenticated By: Rudie Meyer, M.D.    Ct Abdomen Pelvis W Contrast  07/24/2012  *RADIOLOGY REPORT*  Clinical Data: Severe anemia.  Evaluate for underlying malignancy. Shortness of breath.  Chronic alcoholism.  CT CHEST WITH CONTRAST,CT ABDOMEN AND PELVIS WITH CONTRAST  Technique:  Multidetector CT imaging of the chest was performed following the standard protocol during bolus administration of intravenous contrast.,Technique:  Multidetector CT imaging of the abdomen and pelvis was performed following the standard protoc  Contrast: 50mL OMNIPAQUE IOHEXOL 300 MG/ML  SOLN, OMNIPAQUE IOHEXOL 300 MG/ML  SOLN  Comparison: None.  Findings: The chest wall is unremarkable.  No supraclavicular or axillary mass or adenopathy.  There are small scattered lymph nodes.  The thyroid gland is grossly normal.  The bony thorax is intact.  No destructive bone lesions or spinal canal compromise.  The heart is normal in size.  No pericardial effusion.  No mediastinal or hilar lymphadenopathy.  Small scattered lymph nodes are noted.  The esophagus is grossly normal.  The aorta is normal in caliber.  No dissection.  Scattered atherosclerotic calcifications.  Dense coronary artery calcifications.  Examination of the lung  parenchyma demonstrates no infiltrates or worrisome pulmonary lesions.  There are small bilateral pleural effusions with overlying atelectasis.  There is some fluid in the right major fissure.  IMPRESSION:  1.  Small bilateral pleural effusions with overlying atelectasis. 2.  No pulmonary lesions and no mediastinal or hilar adenopathy. 3.  Advanced coronary artery calcifications.  CT ABDOMEN/PELVIS:  The liver is unremarkable.  The gallbladder is normal.  No common bile duct dilatation.  The pancreas is normal.  The spleen is normal in size.  No focal lesions.  The adrenal glands and kidneys are unremarkable.  The stomach, duodenum, small bowel and colon are unremarkable except for chronic-appearing inflammation of the ascending colon with fibrofatty infiltrative changes.  No mesenteric or retroperitoneal mass or adenopathy.  The aorta demonstrates moderate atherosclerotic changes.  The major branch vessels are patent.  There is thrombus in the IVC.  I do not see any definite thrombus in the iliac veins or common femoral veins. Patient is certainly at risk for pulmonary embolism.  The bladder is unremarkable.  The prostate gland and seminal vesicles appear normal.  No pelvic mass or adenopathy.  No free pelvic fluid collections.  Mild presacral edema.  The bony pelvis demonstrates evidence of remote post-traumatic changes.  Impression:  1.  Clot is noted in the IVC.  Patient at risk for pulmonary embolism. 2.  Chronic inflammatory change involving the ascending colon and terminal ileum. 3.  No mass or adenopathy.   Original Report Authenticated By: Rudie Meyer, M.D.         ASSESSMENT:  1. Severe anemia presenting on 07/24/2012 with Hgb of 3.9 g/dL to the ED  2. Macrocytosis with MCV of 124.7 at time of presentation, but low to normal protein level 3. Leukopenia, stable 4. Thrombocytopenia, worsened, possibly worsened due to hydration.  Normal sized spleen. 5. Pancytopenia, likely secondary to alcohol  suppression to bone marrow +/- nutritional deficiency from EtOHism.  Protein level is WNL and low. 6. Thrombus in IVC, will start anticoagulation 7. EtOHism drinking 1/2 bottle of Vodka daily.  In denial. 8. Elevated LDH at 659 on 3/26 9. History of folate deficiency with labs dated 02/16/2011 with folate of 2.2 when he was admitted.  Treated with Folate PO but patient discontinued all medications on his own. 10. Noncompliance   PLAN:  1. I personally reviewed and went over laboratory results with the patient. 2. I personally reviewed and went over radiographic studies with the patient. 3. Anemia panel is pending from date of admission. 4. Added MMA, intrinsic factor antibody, parietal cell antibody, and DAT to blood from  time of admission prior to transfusion if available. 5. Hypercoagulable panel is ordered prior to anticoagulation 6. Lovenox 1 mg/kg BID is ordered.  7. IVC filter is not recommended for this patient since it is only a short-term fix and will require removal at some time.  8. Folate 1 mg IV daily is ordered empirically 9. Vitamin B12 1000 mcg SQ/IM daily x 3 days ordered empirically.  10. PT/INR and aPTT today. 11. EtOH cessation education provided.   All questions were answered. The patient knows to call the clinic with any problems, questions or concerns. We can certainly see the patient much sooner if necessary.  The patient and plan discussed with Glenford Peers, MD and he is in agreement with the aforementioned.  Graden Hoshino

## 2012-07-25 NOTE — Progress Notes (Signed)
INITIAL NUTRITION ASSESSMENT  DOCUMENTATION CODES Per approved criteria  -Not Applicable   INTERVENTION: Ensure Complete po BID, each supplement provides 350 kcal and 13 grams of protein.  NUTRITION DIAGNOSIS: Inadequate oral intake related to chronic ETOH abuse as evidenced by pt hx and current poor po intake.   Goal: Pt to meet >/= 90% of their estimated nutrition needs  Monitor:  Po intake, labs and wt trends  Reason for Assessment: Malnurtriton Screen  67 y.o. male  Admitting Dx: Severe macrocytic anemia, chronic ETOH abuse  ASSESSMENT: Pt is s/p 4 units PRBC secondary to severe anemia. He is sleeping soundly and no family members are present. His breakfast tray is here and very little breakfast consumed this morning. Talked with his RN who tells me he's being transferred out of ICU today.  Suspect inadequate oral nutrition intake and malnutrition given his hx of ETOH abuse will continue to follow and re-assess when pt is more alert. He is receiving a MVI , Thiamine, Mag, Folic acid and B-12 daily. Will add Ensure supplement as well due to his poor meal intake.  Height: Ht Readings from Last 1 Encounters:  07/24/12 5\' 10"  (1.778 m)    Weight: Wt Readings from Last 1 Encounters:  07/25/12 170 lb 3.1 oz (77.2 kg)    Ideal Body Weight: 166# (75.4 kg)  % Ideal Body Weight: 102%  Wt Readings from Last 10 Encounters:  07/25/12 170 lb 3.1 oz (77.2 kg)  02/21/11 162 lb 0.6 oz (73.5 kg)    Usual Body Weight: unknown  BMI:  Body mass index is 24.42 kg/(m^2). normal range  Estimated Nutritional Needs: Kcal: 1925-2310 Protein: 85-100 gr Fluid: >2000 ml/day  Skin: No issues noted  Diet Order: General  EDUCATION NEEDS: -Education not appropriate at this time   Intake/Output Summary (Last 24 hours) at 07/25/12 1137 Last data filed at 07/25/12 0500  Gross per 24 hour  Intake   1155 ml  Output   1651 ml  Net   -496 ml    Last BM: 07/25/12 soft, distended  abdomen  Labs:   Recent Labs Lab 07/24/12 1445 07/24/12 1932 07/25/12 0610  NA 132*  --  133*  K 2.7*  --  3.3*  CL 88*  --  93*  CO2 32  --  33*  BUN 16  --  12  CREATININE 0.48*  --  0.46*  CALCIUM 8.6  --  7.8*  MG  --  1.1*  --   GLUCOSE 125*  --  105*    CBG (last 3)  No results found for this basename: GLUCAP,  in the last 72 hours  Scheduled Meds: . antiseptic oral rinse  15 mL Mouth Rinse BID  . cefTRIAXone (ROCEPHIN)  IV  1 g Intravenous Q24H  . cyanocobalamin  1,000 mcg Intramuscular Daily  . enoxaparin (LOVENOX) injection  1 mg/kg Subcutaneous Q12H  . fluconazole  200 mg Oral Daily  . folic acid  1 mg Oral Daily  . folic acid  1 mg Intravenous Daily  . LORazepam  0-4 mg Intravenous Q6H   Followed by  . [START ON 07/26/2012] LORazepam  0-4 mg Intravenous Q12H  . magnesium sulfate 1 - 4 g bolus IVPB  4 g Intravenous Once  . multivitamin with minerals  1 tablet Oral Daily  . potassium chloride  40 mEq Oral Once  . thiamine  100 mg Oral Daily   Or  . thiamine  100 mg Intravenous Daily  Continuous Infusions:   Past Medical History  Diagnosis Date  . Hypertension   . Thrombocytopenia 02/17/2011  . Anemia 02/17/2011  . Hyponatremia 02/17/2011  . Hypomagnesemia 02/17/2011  . Hypophosphatemia 02/17/2011  . Subdural hematoma 02/17/2011  . Hypogonadism male 02/17/2011    Low testosterone level.    Past Surgical History  Procedure Laterality Date  . Cervical disc surgery      X2  . Lumbar disc surgery      X2    Royann Shivers MS,RD,LDN,CSG Office: #[phone] Pager: [phone]

## 2012-07-25 NOTE — Progress Notes (Signed)
Report called to [facility]. Patient transferred to room 329.

## 2012-07-25 NOTE — Progress Notes (Signed)
UR Chart Review Completed  

## 2012-07-26 DIAGNOSIS — D7589 Other specified diseases of blood and blood-forming organs: Secondary | ICD-10-CM

## 2012-07-26 DIAGNOSIS — D529 Folate deficiency anemia, unspecified: Secondary | ICD-10-CM

## 2012-07-26 DIAGNOSIS — R7402 Elevation of levels of lactic acid dehydrogenase (LDH): Secondary | ICD-10-CM

## 2012-07-26 DIAGNOSIS — R7401 Elevation of levels of liver transaminase levels: Secondary | ICD-10-CM

## 2012-07-26 LAB — CARDIOLIPIN ANTIBODIES, IGG, IGM, IGA
Anticardiolipin IgA: 4 APL U/mL — ABNORMAL LOW (ref <22)
Anticardiolipin IgG: 4 GPL U/mL — ABNORMAL LOW (ref <23)
Anticardiolipin IgM: 1 MPL U/mL — ABNORMAL LOW (ref <11)

## 2012-07-26 LAB — CBC
Hemoglobin: 8.2 g/dL — ABNORMAL LOW (ref 13.0–17.0)
Platelets: 58 10*3/uL — ABNORMAL LOW (ref 150–400)
RBC: 2.36 MIL/uL — ABNORMAL LOW (ref 4.22–5.81)
WBC: 3 10*3/uL — ABNORMAL LOW (ref 4.0–10.5)

## 2012-07-26 LAB — BASIC METABOLIC PANEL
Calcium: 8.2 mg/dL — ABNORMAL LOW (ref 8.4–10.5)
GFR calc Af Amer: >90 mL/min (ref >90)
GFR calc non Af Amer: >90 mL/min (ref >90)
Glucose, Bld: 101 mg/dL — ABNORMAL HIGH (ref 70–99)
Potassium: 3.5 mEq/L (ref 3.5–5.1)
Sodium: 138 mEq/L (ref 135–145)

## 2012-07-26 LAB — TYPE AND SCREEN: Unit division: 0

## 2012-07-26 LAB — LUPUS ANTICOAGULANT PANEL
DRVVT: 37.7 secs (ref <42.9)
Lupus Anticoagulant: NOT DETECTED

## 2012-07-26 LAB — PROTEIN S, TOTAL: Protein S Ag, Total: 119 % (ref 60–150)

## 2012-07-26 LAB — HOMOCYSTEINE: Homocysteine: 78.9 umol/L — ABNORMAL HIGH (ref 4.0–15.4)

## 2012-07-26 MED ORDER — ENOXAPARIN (LOVENOX) PATIENT EDUCATION KIT
PACK | Freq: Once | Status: DC
  Filled 2012-07-26: qty 1

## 2012-07-26 NOTE — Care Management Note (Unsigned)
    Page 1 of 2   07/31/2012     11:12:33 AM   CARE MANAGEMENT NOTE 07/31/2012  Patient:  Zachary Fowler, Zachary Fowler   Account Number:  [id number]  Date Initiated:  07/26/2012  Documentation initiated by:  Rosemary Holms  Subjective/Objective Assessment:   pt admitted from home. Daughter Zachary Fowler is contact for his care. Spoke with Zachary Fowler who will be able to give the lovonox injections. Trula Ore, RN AP, aware that daughter is coming and [facility] RN set up.     Action/Plan:   Anticipated DC Date:  07/31/2012   Anticipated DC Plan:  HOME W HOME HEALTH SERVICES      DC Planning Services  CM consult      Choice offered to / List presented to:          [facility] arranged  HH-10 DISEASE MANAGEMENT  HH-1 RN      [facility] agency  [facility]   Status of service:  In process, will continue to follow Medicare Important Message given?  YES (If response is "NO", the following Medicare IM given date fields will be blank) Date Medicare IM given:  07/30/2012 Date Additional Medicare IM given:    Discharge Disposition:  HOME W HOME HEALTH SERVICES  Per UR Regulation:    If discussed at Long Length of Stay Meetings, dates discussed:   07/30/2012    Comments:  07/31/12 Rosemary Holms RN BSN CM [facility] RN to provide B12 injection as ordered.  07/30/12 Rosemary Holms RN BSN CM Pt to have [facility] RN  and anticipate DC tomorrow.  07/26/12 Rosemary Holms RN BSN CM [id number] Benifit check done and Lovonox will cost $1100 for 12 syringes/ generic will cost $130 for 4 syringes. Dr. Kerry Hough notified. Spoke to daughter Zachary Fowler who states that is cost prohibitive. RN Trula Ore also notified.

## 2012-07-26 NOTE — Progress Notes (Signed)
[facility] PROGRESS NOTE  Zachary Fowler ZOX:[id number] DOB: 12-Mar-1946 DOA: 07/24/2012 PCP: Kirk Ruths, MD  Assessment/Plan: 1. Profound macrocytic anemia. Status post 4 units of PRBCs. Stool Hemoccult is negative then patient does not have signs of bleeding. Anemia may likely be related to chronic alcohol abuse. Vit B12 level and folate levels low. Appreciate hematology input. Remainder of work up including intrinsic factor abs and anti-parietal ab pending. Will continue on folic acid and b12 supplements. 2. Alcohol abuse. Patient is in denial regarding his addiction to alcohol. He is currently on alcohol withdrawal protocol with Ativan. At this time, he does not have any signs of severe withdrawal. 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 This was discussed with hematology who has recommended that patient be started on anticoagulation with Lovenox. Hypercoagulable panel pending.  This was sent before lovenox started. They do not recommend placing an IVC filter. Patient will need to be discharged on long term lovenox.  Follow up with hematology as outpatient. 4. Hypokalemia. Replace 5. Hypomagnesemia. Replace  6. possible urinary tract infection. Urine culture negative.  D/c rocephin. 7. Thrombocytopenia. Likely due to chronic alcohol use.  Code Status: DO NOT RESUSCITATE, confirmed with patient  Family Communication: Discussed with patient's daughter [name] over the phone  Disposition Plan: probable discharge home tomorrow.    Consultants:  Hematology   Procedures:   none   Antibiotics:   Rocephin 3/26 - 3/28  HPI/Subjective: No complaints today.  Objective: Filed Vitals:   07/25/12 1627 07/25/12 1819 07/25/12 2142 07/26/12 0607  BP:  110/60 100/62 113/66  Pulse:  86 85 76  Temp:  98.8 F (37.1 C) 97.9 F (36.6 C) 97.6 F (36.4 C)  TempSrc:  Oral Oral Oral  Resp:  20 20 19   Height:      Weight:       SpO2: 98% 96% 98% 99%    Intake/Output Summary (Last 24 hours) at 07/26/12 1436 Last data filed at 07/26/12 0800  Gross per 24 hour  Intake    290 ml  Output    400 ml  Net   -110 ml   Filed Weights   07/24/12 1900 07/25/12 0500  Weight: 74.5 kg (164 lb 3.9 oz) 77.2 kg (170 lb 3.1 oz)    Exam:   General:  no acute distress. No tremors.  Cardiovascular:  S1, S2, regular rate and rhythm   Respiratory: Clear to auscultation bilaterally  Abdomen:  soft, nontender, nondistended, bowel sounds are active   Musculoskeletal:  no cyanosis or edema bilaterally   Data Reviewed: Basic Metabolic Panel:  Recent Labs Lab 07/24/12 1445 07/24/12 1932 07/25/12 0610 07/26/12 0522  NA 132*  --  133* 138  K 2.7*  --  3.3* 3.5  CL 88*  --  93* 98  CO2 32  --  33* 35*  GLUCOSE 125*  --  105* 101*  BUN 16  --  12 8  CREATININE 0.48*  --  0.46* 0.49*  CALCIUM 8.6  --  7.8* 8.2*  MG  --  1.1*  --   --    Liver Function Tests:  Recent Labs Lab 07/24/12 1445 07/25/12 0610  AST 50* 48*  ALT 33 28  ALKPHOS 77 64  BILITOT 2.1* 2.5*  PROT 6.1 5.3*  ALBUMIN 3.1* 2.7*   No results found for this basename: LIPASE, AMYLASE,  in the last 168 hours No results found for this basename: AMMONIA,  in the last 168 hours CBC:  Recent Labs Lab 07/24/12 1445 07/25/12 0610 07/26/12 0522  WBC 2.6* 2.5* 3.0*  NEUTROABS 1.9  --   --   HGB 3.9* 7.9* 8.2*  HCT 11.0* 21.4* 23.0*  MCV 127.9* 94.7 97.5  PLT 89* 60* 58*   Cardiac Enzymes: No results found for this basename: CKTOTAL, CKMB, CKMBINDEX, TROPONINI,  in the last 168 hours BNP (last 3 results) No results found for this basename: PROBNP,  in the last 8760 hours CBG:  Recent Labs Lab 07/25/12 2137  GLUCAP 121*    Recent Results (from the past 240 hour(s))  MRSA PCR SCREENING     Status: None   Collection Time    07/24/12  7:40 PM      Result Value Range Status   MRSA by PCR NEGATIVE  NEGATIVE Final   Comment:             The GeneXpert MRSA Assay (FDA     approved for NASAL specimens     only), is one component of a     comprehensive MRSA colonization     surveillance program. It is not     intended to diagnose MRSA     infection nor to guide or     monitor treatment for     MRSA infections.  URINE CULTURE     Status: None   Collection Time    07/24/12  9:41 PM      Result Value Range Status   Specimen Description URINE, CLEAN CATCH   Final   Special Requests NONE   Final   Culture  Setup Time 07/24/2012 22:40   Final   Colony Count NO GROWTH   Final   Culture NO GROWTH   Final   Report Status 07/25/2012 FINAL   Final  MRSA PCR SCREENING     Status: None   Collection Time    07/25/12  7:25 AM      Result Value Range Status   MRSA by PCR NEGATIVE  NEGATIVE Final   Comment:            The GeneXpert MRSA Assay (FDA     approved for NASAL specimens     only), is one component of a     comprehensive MRSA colonization     surveillance program. It is not     intended to diagnose MRSA     infection nor to guide or     monitor treatment for     MRSA infections.     Studies: Ct Chest W Contrast  07/24/2012  *RADIOLOGY REPORT*  Clinical Data: Severe anemia.  Evaluate for underlying malignancy. Shortness of breath.  Chronic alcoholism.  CT CHEST WITH CONTRAST,CT ABDOMEN AND PELVIS WITH CONTRAST  Technique:  Multidetector CT imaging of the chest was performed following the standard protocol during bolus administration of intravenous contrast.,Technique:  Multidetector CT imaging of the abdomen and pelvis was performed following the standard protoc  Contrast: 50mL OMNIPAQUE IOHEXOL 300 MG/ML  SOLN, OMNIPAQUE IOHEXOL 300 MG/ML  SOLN  Comparison: None.  Findings: The chest wall is unremarkable.  No supraclavicular or axillary mass or adenopathy.  There are small scattered lymph nodes.  The thyroid gland is grossly normal.  The bony thorax is intact.  No destructive bone lesions or spinal canal compromise.   The heart is normal in size.  No pericardial effusion.  No mediastinal or hilar lymphadenopathy.  Small scattered lymph nodes are noted.  The esophagus is grossly normal.  The aorta is normal in caliber.  No dissection.  Scattered atherosclerotic calcifications.  Dense coronary artery calcifications.  Examination of the lung parenchyma demonstrates no infiltrates or worrisome pulmonary lesions.  There are small bilateral pleural effusions with overlying atelectasis.  There is some fluid in the right major fissure.  IMPRESSION:  1.  Small bilateral pleural effusions with overlying atelectasis. 2.  No pulmonary lesions and no mediastinal or hilar adenopathy. 3.  Advanced coronary artery calcifications.  CT ABDOMEN/PELVIS:  The liver is unremarkable.  The gallbladder is normal.  No common bile duct dilatation.  The pancreas is normal.  The spleen is normal in size.  No focal lesions.  The adrenal glands and kidneys are unremarkable.  The stomach, duodenum, small bowel and colon are unremarkable except for chronic-appearing inflammation of the ascending colon with fibrofatty infiltrative changes.  No mesenteric or retroperitoneal mass or adenopathy.  The aorta demonstrates moderate atherosclerotic changes.  The major branch vessels are patent.  There is thrombus in the IVC.  I do not see any definite thrombus in the iliac veins or common femoral veins. Patient is certainly at risk for pulmonary embolism.  The bladder is unremarkable.  The prostate gland and seminal vesicles appear normal.  No pelvic mass or adenopathy.  No free pelvic fluid collections.  Mild presacral edema.  The bony pelvis demonstrates evidence of remote post-traumatic changes.  Impression:  1.  Clot is noted in the IVC.  Patient at risk for pulmonary embolism. 2.  Chronic inflammatory change involving the ascending colon and terminal ileum. 3.  No mass or adenopathy.   Original Report Authenticated By: Rudie Meyer, M.D.    Ct Abdomen Pelvis W  Contrast  07/24/2012  *RADIOLOGY REPORT*  Clinical Data: Severe anemia.  Evaluate for underlying malignancy. Shortness of breath.  Chronic alcoholism.  CT CHEST WITH CONTRAST,CT ABDOMEN AND PELVIS WITH CONTRAST  Technique:  Multidetector CT imaging of the chest was performed following the standard protocol during bolus administration of intravenous contrast.,Technique:  Multidetector CT imaging of the abdomen and pelvis was performed following the standard protoc  Contrast: 50mL OMNIPAQUE IOHEXOL 300 MG/ML  SOLN, OMNIPAQUE IOHEXOL 300 MG/ML  SOLN  Comparison: None.  Findings: The chest wall is unremarkable.  No supraclavicular or axillary mass or adenopathy.  There are small scattered lymph nodes.  The thyroid gland is grossly normal.  The bony thorax is intact.  No destructive bone lesions or spinal canal compromise.  The heart is normal in size.  No pericardial effusion.  No mediastinal or hilar lymphadenopathy.  Small scattered lymph nodes are noted.  The esophagus is grossly normal.  The aorta is normal in caliber.  No dissection.  Scattered atherosclerotic calcifications.  Dense coronary artery calcifications.  Examination of the lung parenchyma demonstrates no infiltrates or worrisome pulmonary lesions.  There are small bilateral pleural effusions with overlying atelectasis.  There is some fluid in the right major fissure.  IMPRESSION:  1.  Small bilateral pleural effusions with overlying atelectasis. 2.  No pulmonary lesions and no mediastinal or hilar adenopathy. 3.  Advanced coronary artery calcifications.  CT ABDOMEN/PELVIS:  The liver is unremarkable.  The gallbladder is normal.  No common bile duct dilatation.  The pancreas is normal.  The spleen is normal in size.  No focal lesions.  The adrenal glands and kidneys are unremarkable.  The stomach, duodenum, small bowel and colon are unremarkable except for chronic-appearing inflammation of the ascending colon  with fibrofatty infiltrative changes.   No mesenteric or retroperitoneal mass or adenopathy.  The aorta demonstrates moderate atherosclerotic changes.  The major branch vessels are patent.  There is thrombus in the IVC.  I do not see any definite thrombus in the iliac veins or common femoral veins. Patient is certainly at risk for pulmonary embolism.  The bladder is unremarkable.  The prostate gland and seminal vesicles appear normal.  No pelvic mass or adenopathy.  No free pelvic fluid collections.  Mild presacral edema.  The bony pelvis demonstrates evidence of remote post-traumatic changes.  Impression:  1.  Clot is noted in the IVC.  Patient at risk for pulmonary embolism. 2.  Chronic inflammatory change involving the ascending colon and terminal ileum. 3.  No mass or adenopathy.   Original Report Authenticated By: Rudie Meyer, M.D.     Scheduled Meds: . antiseptic oral rinse  15 mL Mouth Rinse BID  . cefTRIAXone (ROCEPHIN)  IV  1 g Intravenous Q24H  . cyanocobalamin  1,000 mcg Intramuscular Daily  . enoxaparin (LOVENOX) injection  1 mg/kg Subcutaneous Q12H  . feeding supplement  237 mL Oral BID BM  . fluconazole  200 mg Oral Daily  . folic acid  1 mg Intravenous Daily  . LORazepam  0-4 mg Intravenous Q6H   Followed by  . LORazepam  0-4 mg Intravenous Q12H  . multivitamin with minerals  1 tablet Oral Daily  . thiamine  100 mg Oral Daily   Or  . thiamine  100 mg Intravenous Daily   Continuous Infusions:   Active Problems:   Alcoholism /alcohol abuse   Hypokalemia   Thrombocytopenia   Anemia   Hyponatremia   UTI (urinary tract infection)   IVC thrombosis    Time spent:    MEMON,JEHANZEB  [facility] Pager [phone]. If 7PM-7AM, please contact night-coverage at [email or web address], password [facility] 07/26/2012, 2:36 PM  LOS: 2 days

## 2012-07-26 NOTE — Progress Notes (Signed)
Subjective: I personally reviewed and went over laboratory results with the patient.  Patient reports that he "loves" himself when asked if he does.  He likes to play the guitar.  He loves music.   Patient was educated on his EtOHism.  He was educated on the effects of EtOH toxicity on the body.  Objective: Vital signs in last 24 hours: Temp:  [97.6 F (36.4 C)-98.8 F (37.1 C)] 97.6 F (36.4 C) (03/28 0607) Pulse Rate:  [76-89] 76 (03/28 0607) Resp:  [19-20] 19 (03/28 0607) BP: (94-113)/(57-66) 113/66 mmHg (03/28 0607) SpO2:  [96 %-99 %] 99 % (03/28 0607)  Intake/Output from previous day: 03/27 0800 - 03/28 0759 In: 150 [IV Piggyback:150] Out: 1050 [Urine:1050] Intake/Output this shift:    General appearance: alert, cooperative, slowed mentation and slow speech GI: soft, non-tender; bowel sounds normal; no masses,  no organomegaly Lymph: No lymphadenopathy noted  Lab Results:   Recent Labs  07/25/12 0610 07/26/12 0522  WBC 2.5* 3.0*  HGB 7.9* 8.2*  HCT 21.4* 23.0*  PLT 60* 58*   BMET  Recent Labs  07/25/12 0610 07/26/12 0522  NA 133* 138  K 3.3* 3.5  CL 93* 98  CO2 33* 35*  GLUCOSE 105* 101*  BUN 12 8  CREATININE 0.46* 0.49*  CALCIUM 7.8* 8.2*    Studies/Results: Ct Chest W Contrast  07/24/2012  *RADIOLOGY REPORT*  Clinical Data: Severe anemia.  Evaluate for underlying malignancy. Shortness of breath.  Chronic alcoholism.  CT CHEST WITH CONTRAST,CT ABDOMEN AND PELVIS WITH CONTRAST  Technique:  Multidetector CT imaging of the chest was performed following the standard protocol during bolus administration of intravenous contrast.,Technique:  Multidetector CT imaging of the abdomen and pelvis was performed following the standard protoc  Contrast: 50mL OMNIPAQUE IOHEXOL 300 MG/ML  SOLN, OMNIPAQUE IOHEXOL 300 MG/ML  SOLN  Comparison: None.  Findings: The chest wall is unremarkable.  No supraclavicular or axillary mass or adenopathy.  There are small  scattered lymph nodes.  The thyroid gland is grossly normal.  The bony thorax is intact.  No destructive bone lesions or spinal canal compromise.  The heart is normal in size.  No pericardial effusion.  No mediastinal or hilar lymphadenopathy.  Small scattered lymph nodes are noted.  The esophagus is grossly normal.  The aorta is normal in caliber.  No dissection.  Scattered atherosclerotic calcifications.  Dense coronary artery calcifications.  Examination of the lung parenchyma demonstrates no infiltrates or worrisome pulmonary lesions.  There are small bilateral pleural effusions with overlying atelectasis.  There is some fluid in the right major fissure.  IMPRESSION:  1.  Small bilateral pleural effusions with overlying atelectasis. 2.  No pulmonary lesions and no mediastinal or hilar adenopathy. 3.  Advanced coronary artery calcifications.  CT ABDOMEN/PELVIS:  The liver is unremarkable.  The gallbladder is normal.  No common bile duct dilatation.  The pancreas is normal.  The spleen is normal in size.  No focal lesions.  The adrenal glands and kidneys are unremarkable.  The stomach, duodenum, small bowel and colon are unremarkable except for chronic-appearing inflammation of the ascending colon with fibrofatty infiltrative changes.  No mesenteric or retroperitoneal mass or adenopathy.  The aorta demonstrates moderate atherosclerotic changes.  The major branch vessels are patent.  There is thrombus in the IVC.  I do not see any definite thrombus in the iliac veins or common femoral veins. Patient is certainly at risk for pulmonary embolism.  The bladder is unremarkable.  The  prostate gland and seminal vesicles appear normal.  No pelvic mass or adenopathy.  No free pelvic fluid collections.  Mild presacral edema.  The bony pelvis demonstrates evidence of remote post-traumatic changes.  Impression:  1.  Clot is noted in the IVC.  Patient at risk for pulmonary embolism. 2.  Chronic inflammatory change involving the  ascending colon and terminal ileum. 3.  No mass or adenopathy.   Original Report Authenticated By: Rudie Meyer, M.D.    Ct Abdomen Pelvis W Contrast  07/24/2012  *RADIOLOGY REPORT*  Clinical Data: Severe anemia.  Evaluate for underlying malignancy. Shortness of breath.  Chronic alcoholism.  CT CHEST WITH CONTRAST,CT ABDOMEN AND PELVIS WITH CONTRAST  Technique:  Multidetector CT imaging of the chest was performed following the standard protocol during bolus administration of intravenous contrast.,Technique:  Multidetector CT imaging of the abdomen and pelvis was performed following the standard protoc  Contrast: 50mL OMNIPAQUE IOHEXOL 300 MG/ML  SOLN, OMNIPAQUE IOHEXOL 300 MG/ML  SOLN  Comparison: None.  Findings: The chest wall is unremarkable.  No supraclavicular or axillary mass or adenopathy.  There are small scattered lymph nodes.  The thyroid gland is grossly normal.  The bony thorax is intact.  No destructive bone lesions or spinal canal compromise.  The heart is normal in size.  No pericardial effusion.  No mediastinal or hilar lymphadenopathy.  Small scattered lymph nodes are noted.  The esophagus is grossly normal.  The aorta is normal in caliber.  No dissection.  Scattered atherosclerotic calcifications.  Dense coronary artery calcifications.  Examination of the lung parenchyma demonstrates no infiltrates or worrisome pulmonary lesions.  There are small bilateral pleural effusions with overlying atelectasis.  There is some fluid in the right major fissure.  IMPRESSION:  1.  Small bilateral pleural effusions with overlying atelectasis. 2.  No pulmonary lesions and no mediastinal or hilar adenopathy. 3.  Advanced coronary artery calcifications.  CT ABDOMEN/PELVIS:  The liver is unremarkable.  The gallbladder is normal.  No common bile duct dilatation.  The pancreas is normal.  The spleen is normal in size.  No focal lesions.  The adrenal glands and kidneys are unremarkable.  The stomach, duodenum,  small bowel and colon are unremarkable except for chronic-appearing inflammation of the ascending colon with fibrofatty infiltrative changes.  No mesenteric or retroperitoneal mass or adenopathy.  The aorta demonstrates moderate atherosclerotic changes.  The major branch vessels are patent.  There is thrombus in the IVC.  I do not see any definite thrombus in the iliac veins or common femoral veins. Patient is certainly at risk for pulmonary embolism.  The bladder is unremarkable.  The prostate gland and seminal vesicles appear normal.  No pelvic mass or adenopathy.  No free pelvic fluid collections.  Mild presacral edema.  The bony pelvis demonstrates evidence of remote post-traumatic changes.  Impression:  1.  Clot is noted in the IVC.  Patient at risk for pulmonary embolism. 2.  Chronic inflammatory change involving the ascending colon and terminal ileum. 3.  No mass or adenopathy.   Original Report Authenticated By: Rudie Meyer, M.D.     Medications: I have reviewed the patient's current medications.  Assessment/Plan: 1. Nutritional deficienct versus poor absorption, low vitamin b12 at 168 and folate is <0.4.  Patient started on Folate 1 mg IV daily and Vitamin B12 1000 mcg SQ daily through Saturday (3/29). 2. EtOH toxicity causing bone marrow suppression and contributing to #1. 3. Thrombus in IVC, started on Lovenox 1 mg/kg  BID on 07/25/2012 after hypercoagulable panel performed.  4. Severe anemia with Hgb of 3.9 at time of presentation.  S/P 4 units PRBCs. 5. Macrocytosis secondary to Folate and Vitamin B 12 deficiency 6. Elevated LDH secondary to #1 7. Leukopenia, secondary to bone marrow suppression from EtOH and #1 8. Thrombocytopenia, secondary to bone marrow suppression and #1 9. Patient education regarding EtOHism  10. Neurologic damage from #1  Patient and plan discussed with Dr. Glenford Peers and he is in agreement with the aforementioned.  Patient seen by Dr. Mariel Sleet as well.     LOS: 2 days    Lanore Renderos 07/26/2012

## 2012-07-26 NOTE — Progress Notes (Signed)
Physical Therapy Treatment Patient Details Name: Zachary Fowler MRN: [id number] DOB: [birth year] Today's Date: 07/26/2012 Time: 4098-1191 PT Time Calculation (min): 20 min  PT Assessment / Plan / Recommendation Comments on Treatment Session  Pt able to come to sitting without assist today.  Pt standing balance is fair therefore he was urged to use a walker at home when he attempts to transfer.  Pt needs min assist to transfer.    Follow Up Recommendations  Home health PT     Does the patient have the potential to tolerate intense rehabilitation   no  Barriers to Discharge  none      Equipment Recommendations  None recommended by PT    Recommendations for Other Services  none  Frequency   tiw  Plan Discharge plan needs to be updated    Precautions / Restrictions   falls      Mobility  Bed Mobility Supine to Sit: 6: Modified independent (Device/Increase time) Sit to Supine: Not Tested (comment) Transfers Stand Pivot Transfers: 4: Min assist    Exercises General Exercises - Lower Extremity Quad Sets: Strengthening;Both;10 reps Gluteal Sets: Strengthening;Both;10 reps Long Arc Quad: Strengthening;Both;10 reps Straight Leg Raises: Strengthening;Both;5 reps Mini-Sqauts: 5 reps (sit to stand)   PT Diagnosis:   decreased mobility PT Problem List:  decreased safety awareness PT Treatment Interventions:   there ex; there activity  PT Goals Acute Rehab PT Goals PT Goal Formulation: With patient Time For Goal Achievement: 08/01/12 Potential to Achieve Goals: Fair PT Transfer Goal: Bed to Chair/Chair to Bed - Progress: Met  Visit Information  Last PT Received On: 07/26/12    Subjective Data  Subjective: My legs are weak and I don't have much balance.  Pt able to transfer I prior to admission but became weak enough he needed his daughter's assistance right before admission   Cognition    wnl   Balance   fair-  End of Session PT - End of Session Equipment Utilized  During Treatment: Gait belt Patient left: in chair;with chair alarm set;with call bell/phone within reach   GP     RUSSELL,CINDY 07/26/2012, 3:33 PM

## 2012-07-27 DIAGNOSIS — R279 Unspecified lack of coordination: Secondary | ICD-10-CM

## 2012-07-27 LAB — CBC
Platelets: 59 10*3/uL — ABNORMAL LOW (ref 150–400)
RBC: 2.33 MIL/uL — ABNORMAL LOW (ref 4.22–5.81)
RDW: 25.3 % — ABNORMAL HIGH (ref 11.5–15.5)
WBC: 4.5 10*3/uL (ref 4.0–10.5)

## 2012-07-27 NOTE — Progress Notes (Signed)
[facility] PROGRESS NOTE  ULICE FOLLETT WJX:[id number] DOB: Dec 26, 1945 DOA: 07/24/2012 PCP: Kirk Ruths, MD  Assessment/Plan: 1. Profound macrocytic anemia. Status post 4 units of PRBCs. Stool Hemoccult is negative then patient does not have signs of bleeding. Anemia may likely be related to chronic alcohol abuse. Vit B12 level and folate levels low. Appreciate hematology input. Remainder of work up including intrinsic factor abs and anti-parietal ab pending. Will continue on folic acid and b12 supplements. Hemoglobin is currently stable. 2. Alcohol abuse. Patient is in denial regarding his addiction to alcohol. He is currently on alcohol withdrawal protocol with Ativan. At this time, he does not have any signs of severe withdrawal. 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 This was discussed with hematology who has recommended that patient be started on anticoagulation with Lovenox. Hypercoagulable panel pending.  This was sent before lovenox started. Unfortunately, patient will not be able to discharge home on Lovenox due to financial reasons. He is a very high-risk for bleeding with Xarelto and with his ongoing alcohol abuse, Coumadin would not be a safe choice. Discussed with hematology, and recommendations are for IVC filter placement. I explained to the patient's daughter that this would not be an optimal treatment, but with the patient's multiple comorbidities and limited options for treatment, this will likely be the safest at this time. We will try to get this done on Monday with interventional radiology. 4. Hypokalemia. Replace 5. Hypomagnesemia. Replace  6. possible urinary tract infection. Urine culture negative.  D/c rocephin. 7. Thrombocytopenia. Likely due to chronic alcohol use.  Code Status: DO NOT RESUSCITATE, confirmed with patient  Family Communication: Discussed with patient's daughter [name] over the phone   Disposition Plan: probable discharge home after IVC filter placement    Consultants:  Hematology   Procedures:   none   Antibiotics:   Rocephin 3/26 - 3/28  HPI/Subjective: Patient does not have any complaints today  Objective: Filed Vitals:   07/26/12 0607 07/26/12 2232 07/27/12 0500 07/27/12 1445  BP: 113/66 110/67 112/71 107/63  Pulse: 76 82 76 74  Temp: 97.6 F (36.4 C) 98.6 F (37 C) 98.2 F (36.8 C) 97.8 F (36.6 C)  TempSrc: Oral Oral Oral   Resp: 19 20 20 20   Height:      Weight:      SpO2: 99% 97% 94% 97%    Intake/Output Summary (Last 24 hours) at 07/27/12 1745 Last data filed at 07/27/12 0900  Gross per 24 hour  Intake    120 ml  Output    300 ml  Net   -180 ml   Filed Weights   07/24/12 1900 07/25/12 0500  Weight: 74.5 kg (164 lb 3.9 oz) 77.2 kg (170 lb 3.1 oz)    Exam:   General:  no acute distress. No tremors.  Cardiovascular:  S1, S2, regular rate and rhythm   Respiratory: Clear to auscultation bilaterally  Abdomen:  soft, nontender, nondistended, bowel sounds are active   Musculoskeletal:  no cyanosis or edema bilaterally   Data Reviewed: Basic Metabolic Panel:  Recent Labs Lab 07/24/12 1445 07/24/12 1932 07/25/12 0610 07/26/12 0522  NA 132*  --  133* 138  K 2.7*  --  3.3* 3.5  CL 88*  --  93* 98  CO2 32  --  33* 35*  GLUCOSE 125*  --  105* 101*  BUN 16  --  12 8  CREATININE 0.48*  --  0.46* 0.49*  CALCIUM 8.6  --  7.8* 8.2*  MG  --  1.1*  --   --    Liver Function Tests:  Recent Labs Lab 07/24/12 1445 07/25/12 0610  AST 50* 48*  ALT 33 28  ALKPHOS 77 64  BILITOT 2.1* 2.5*  PROT 6.1 5.3*  ALBUMIN 3.1* 2.7*   No results found for this basename: LIPASE, AMYLASE,  in the last 168 hours No results found for this basename: AMMONIA,  in the last 168 hours CBC:  Recent Labs Lab 07/24/12 1445 07/25/12 0610 07/26/12 0522 07/27/12 0625  WBC 2.6* 2.5* 3.0* 4.5  NEUTROABS 1.9  --   --   --   HGB 3.9* 7.9*  8.2* 7.9*  HCT 11.0* 21.4* 23.0* 23.1*  MCV 127.9* 94.7 97.5 99.1  PLT 89* 60* 58* 59*   Cardiac Enzymes: No results found for this basename: CKTOTAL, CKMB, CKMBINDEX, TROPONINI,  in the last 168 hours BNP (last 3 results) No results found for this basename: PROBNP,  in the last 8760 hours CBG:  Recent Labs Lab 07/25/12 2137  GLUCAP 121*    Recent Results (from the past 240 hour(s))  MRSA PCR SCREENING     Status: None   Collection Time    07/24/12  7:40 PM      Result Value Range Status   MRSA by PCR NEGATIVE  NEGATIVE Final   Comment:            The GeneXpert MRSA Assay (FDA     approved for NASAL specimens     only), is one component of a     comprehensive MRSA colonization     surveillance program. It is not     intended to diagnose MRSA     infection nor to guide or     monitor treatment for     MRSA infections.  URINE CULTURE     Status: None   Collection Time    07/24/12  9:41 PM      Result Value Range Status   Specimen Description URINE, CLEAN CATCH   Final   Special Requests NONE   Final   Culture  Setup Time 07/24/2012 22:40   Final   Colony Count NO GROWTH   Final   Culture NO GROWTH   Final   Report Status 07/25/2012 FINAL   Final  MRSA PCR SCREENING     Status: None   Collection Time    07/25/12  7:25 AM      Result Value Range Status   MRSA by PCR NEGATIVE  NEGATIVE Final   Comment:            The GeneXpert MRSA Assay (FDA     approved for NASAL specimens     only), is one component of a     comprehensive MRSA colonization     surveillance program. It is not     intended to diagnose MRSA     infection nor to guide or     monitor treatment for     MRSA infections.     Studies: No results found.  Scheduled Meds: . antiseptic oral rinse  15 mL Mouth Rinse BID  . enoxaparin (LOVENOX) injection  1 mg/kg Subcutaneous Q12H  . enoxaparin   Does not apply Once  . feeding supplement  237 mL Oral BID BM  . fluconazole  200 mg Oral Daily  .  folic acid  1 mg Intravenous Daily  . LORazepam  0-4 mg Intravenous Q12H  . multivitamin with minerals  1 tablet Oral Daily  . thiamine  100 mg Oral Daily   Or  . thiamine  100 mg Intravenous Daily   Continuous Infusions:   Active Problems:   Alcoholism /alcohol abuse   Hypokalemia   Thrombocytopenia   Anemia   Hyponatremia   UTI (urinary tract infection)   IVC thrombosis    Time spent:    Wilkes Potvin  [facility] Pager [phone]. If 7PM-7AM, please contact night-coverage at [email or web address], password [facility] 07/27/2012, 5:45 PM  LOS: 3 days

## 2012-07-28 NOTE — Progress Notes (Signed)
[facility] PROGRESS NOTE  Zachary Fowler GNF:[id number] DOB: [birth year] DOA: 07/24/2012 PCP: Kirk Ruths, MD  Assessment/Plan: 1. Profound macrocytic anemia. Status post 4 units of PRBCs. Stool Hemoccult is negative and patient does not have signs of bleeding. Anemia may likely be related to chronic alcohol abuse. Vit B12 level and folate levels low. Appreciate hematology input. Remainder of work up including intrinsic factor abs and anti-parietal ab pending. Will continue on folic acid and b12 supplements. Hemoglobin is currently stable. 2. Alcohol abuse. Patient is in denial regarding his addiction to alcohol. He is currently on alcohol withdrawal protocol with Ativan. At this time, he does not have any signs of severe withdrawal. 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 This was discussed with hematology who has recommended that patient be started on anticoagulation with Lovenox. Hypercoagulable panel pending.  This was sent before lovenox started. Unfortunately, patient will not be able to discharge home on Lovenox due to financial reasons. He is a very high-risk for bleeding with Xarelto and with his ongoing alcohol abuse, Coumadin would not be a safe choice. Discussed with hematology, and recommendations are for IVC filter placement. I explained to the patient's daughter that this would not be an optimal treatment, but with the patient's multiple comorbidities and limited options for treatment, this will likely be the safest at this time. We will try to get this done on Monday with interventional radiology. 4. Hypokalemia. Replace 5. Hypomagnesemia. Replace  6. possible urinary tract infection. Urine culture negative.  D/c rocephin. 7. Thrombocytopenia. Likely due to chronic alcohol use.  Code Status: DO NOT RESUSCITATE, confirmed with patient  Family Communication: Discussed with patient's daughter [name] over the phone   Disposition Plan: probable discharge home after IVC filter placement    Consultants:  Hematology   Procedures:   none   Antibiotics:   Rocephin 3/26 - 3/28  HPI/Subjective: No complaints.  Objective: Filed Vitals:   07/27/12 2000 07/27/12 2206 07/28/12 0649 07/28/12 1421  BP:  110/65 114/67 115/67  Pulse: 76 69 68 88  Temp:  98.2 F (36.8 C) 97.3 F (36.3 C) 97.5 F (36.4 C)  TempSrc:  Oral Oral   Resp:  20 20 20   Height:      Weight:      SpO2:  100% 95% 92%    Intake/Output Summary (Last 24 hours) at 07/28/12 1456 Last data filed at 07/28/12 0650  Gross per 24 hour  Intake    240 ml  Output    500 ml  Net   -260 ml   Filed Weights   07/24/12 1900 07/25/12 0500  Weight: 74.5 kg (164 lb 3.9 oz) 77.2 kg (170 lb 3.1 oz)    Exam:   General:  no acute distress. No tremors.  Cardiovascular:  S1, S2, regular rate and rhythm   Respiratory: Clear to auscultation bilaterally  Abdomen:  soft, nontender, nondistended, bowel sounds are active   Musculoskeletal:  no cyanosis or edema bilaterally   Data Reviewed: Basic Metabolic Panel:  Recent Labs Lab 07/24/12 1445 07/24/12 1932 07/25/12 0610 07/26/12 0522  NA 132*  --  133* 138  K 2.7*  --  3.3* 3.5  CL 88*  --  93* 98  CO2 32  --  33* 35*  GLUCOSE 125*  --  105* 101*  BUN 16  --  12 8  CREATININE 0.48*  --  0.46* 0.49*  CALCIUM 8.6  --  7.8* 8.2*  MG  --  1.1*  --   --    Liver Function Tests:  Recent Labs Lab 07/24/12 1445 07/25/12 0610  AST 50* 48*  ALT 33 28  ALKPHOS 77 64  BILITOT 2.1* 2.5*  PROT 6.1 5.3*  ALBUMIN 3.1* 2.7*   No results found for this basename: LIPASE, AMYLASE,  in the last 168 hours No results found for this basename: AMMONIA,  in the last 168 hours CBC:  Recent Labs Lab 07/24/12 1445 07/25/12 0610 07/26/12 0522 07/27/12 0625  WBC 2.6* 2.5* 3.0* 4.5  NEUTROABS 1.9  --   --   --   HGB 3.9* 7.9* 8.2* 7.9*  HCT 11.0* 21.4* 23.0* 23.1*  MCV 127.9* 94.7  97.5 99.1  PLT 89* 60* 58* 59*   Cardiac Enzymes: No results found for this basename: CKTOTAL, CKMB, CKMBINDEX, TROPONINI,  in the last 168 hours BNP (last 3 results) No results found for this basename: PROBNP,  in the last 8760 hours CBG:  Recent Labs Lab 07/25/12 2137  GLUCAP 121*    Recent Results (from the past 240 hour(s))  MRSA PCR SCREENING     Status: None   Collection Time    07/24/12  7:40 PM      Result Value Range Status   MRSA by PCR NEGATIVE  NEGATIVE Final   Comment:            The GeneXpert MRSA Assay (FDA     approved for NASAL specimens     only), is one component of a     comprehensive MRSA colonization     surveillance program. It is not     intended to diagnose MRSA     infection nor to guide or     monitor treatment for     MRSA infections.  URINE CULTURE     Status: None   Collection Time    07/24/12  9:41 PM      Result Value Range Status   Specimen Description URINE, CLEAN CATCH   Final   Special Requests NONE   Final   Culture  Setup Time 07/24/2012 22:40   Final   Colony Count NO GROWTH   Final   Culture NO GROWTH   Final   Report Status 07/25/2012 FINAL   Final  MRSA PCR SCREENING     Status: None   Collection Time    07/25/12  7:25 AM      Result Value Range Status   MRSA by PCR NEGATIVE  NEGATIVE Final   Comment:            The GeneXpert MRSA Assay (FDA     approved for NASAL specimens     only), is one component of a     comprehensive MRSA colonization     surveillance program. It is not     intended to diagnose MRSA     infection nor to guide or     monitor treatment for     MRSA infections.     Studies: No results found.  Scheduled Meds: . antiseptic oral rinse  15 mL Mouth Rinse BID  . enoxaparin (LOVENOX) injection  1 mg/kg Subcutaneous Q12H  . enoxaparin   Does not apply Once  . feeding supplement  237 mL Oral BID BM  . folic acid  1 mg Intravenous Daily  . LORazepam  0-4 mg Intravenous Q12H  . multivitamin with  minerals  1 tablet Oral Daily  .  thiamine  100 mg Oral Daily   Or  . thiamine  100 mg Intravenous Daily   Continuous Infusions:   Active Problems:   Alcoholism /alcohol abuse   Hypokalemia   Thrombocytopenia   Anemia   Hyponatremia   UTI (urinary tract infection)   IVC thrombosis    Time spent:    Braelin Brosch  [facility] Pager [phone]. If 7PM-7AM, please contact night-coverage at [email or web address], password [facility] 07/28/2012, 2:56 PM  LOS: 4 days

## 2012-07-29 ENCOUNTER — Inpatient Hospital Stay (HOSPITAL_COMMUNITY): Payer: Medicare HMO

## 2012-07-29 LAB — CBC
HCT: 24.7 % — ABNORMAL LOW (ref 39.0–52.0)
RBC: 2.36 MIL/uL — ABNORMAL LOW (ref 4.22–5.81)
RDW: 27.3 % — ABNORMAL HIGH (ref 11.5–15.5)
WBC: 4.1 10*3/uL (ref 4.0–10.5)

## 2012-07-29 LAB — ANTI-PARIETAL ANTIBODY: Parietal Cell Antibody-IgG: NEGATIVE

## 2012-07-29 MED ORDER — FOLIC ACID 1 MG PO TABS
1.0000 mg | ORAL_TABLET | Freq: Every day | ORAL | Status: DC
  Administered 2012-07-31: 1 mg via ORAL
  Filled 2012-07-29: qty 1

## 2012-07-29 NOTE — Progress Notes (Signed)
[facility] PROGRESS NOTE  Zachary Fowler WNU:[id number] DOB: 1946-03-01 DOA: 07/24/2012 PCP: Kirk Ruths, MD  Assessment/Plan: 1. Profound macrocytic anemia. Status post 4 units of PRBCs. Stool Hemoccult is negative and patient does not have signs of bleeding. Vit B12 level and folate levels low. Appreciate hematology input. Will continue on folic acid and b12 supplements. Hemoglobin is currently stable. Intrinsic factor antibodies are positive. Parietal cell antibodies are currently pending. Question pernicious anemia. Defer further workup to hematology. 2. Alcohol abuse. Patient is in denial regarding his addiction to alcohol. He is currently on alcohol withdrawal protocol with Ativan. At this time, he does not have any signs of severe withdrawal. 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 This was discussed with hematology who has recommended that patient be started on anticoagulation with Lovenox. Hypercoagulable panel pending.  This was sent before lovenox started. Unfortunately, patient will not be able to discharge home on Lovenox due to financial reasons. He is a very high-risk for bleeding with Xarelto and with his ongoing alcohol abuse, Coumadin would not be a safe choice. Discussed with hematology, and recommendations were for IVC filter placement. Case was further discussed with Dr. Grace Isaac, interventional radiology. Recommendations were to obtain venous Dopplers of the lower extremities bilaterally to evaluate for any underlying DVT. If this is found to be positive, then patient would require filter placement, since he would be likely that lower extremity DVT migrated to IVC. If lower extremity Dopplers are negative, then patient will need further evaluation with repeat CT abdomen and pelvis with contrast to evaluate for persistent IVC thrombus. 4. Hypokalemia. Replace 5. Hypomagnesemia. Replace  6. possible urinary tract  infection. Urine culture negative.  D/c rocephin. 7. Thrombocytopenia. Likely due to chronic alcohol use.  Code Status: DO NOT RESUSCITATE, confirmed with patient  Family Communication:  Disposition Plan: discharge home once work up is complete    Consultants:  Hematology   Procedures:   none   Antibiotics:   Rocephin 3/26 - 3/28  HPI/Subjective: No complaints. Wants to eat.  Objective: Filed Vitals:   07/28/12 0649 07/28/12 1421 07/28/12 2302 07/29/12 0557  BP: 114/67 115/67 123/62 112/70  Pulse: 68 88 71 66  Temp: 97.3 F (36.3 C) 97.5 F (36.4 C) 98 F (36.7 C) 97.9 F (36.6 C)  TempSrc: Oral  Oral Oral  Resp: 20 20 20 20   Height:      Weight:      SpO2: 95% 92% 98% 96%    Intake/Output Summary (Last 24 hours) at 07/29/12 1401 Last data filed at 07/28/12 1800  Gross per 24 hour  Intake    240 ml  Output      0 ml  Net    240 ml   Filed Weights   07/24/12 1900 07/25/12 0500  Weight: 74.5 kg (164 lb 3.9 oz) 77.2 kg (170 lb 3.1 oz)    Exam:   General:  no acute distress. No tremors.  Cardiovascular:  S1, S2, regular rate and rhythm   Respiratory: Clear to auscultation bilaterally  Abdomen:  soft, nontender, nondistended, bowel sounds are active   Musculoskeletal:  no cyanosis or edema bilaterally   Data Reviewed: Basic Metabolic Panel:  Recent Labs Lab 07/24/12 1445 07/24/12 1932 07/25/12 0610 07/26/12 0522  NA 132*  --  133* 138  K 2.7*  --  3.3* 3.5  CL 88*  --  93* 98  CO2 32  --  33* 35*  GLUCOSE 125*  --  105* 101*  BUN 16  --  12 8  CREATININE 0.48*  --  0.46* 0.49*  CALCIUM 8.6  --  7.8* 8.2*  MG  --  1.1*  --   --    Liver Function Tests:  Recent Labs Lab 07/24/12 1445 07/25/12 0610  AST 50* 48*  ALT 33 28  ALKPHOS 77 64  BILITOT 2.1* 2.5*  PROT 6.1 5.3*  ALBUMIN 3.1* 2.7*   No results found for this basename: LIPASE, AMYLASE,  in the last 168 hours No results found for this basename: AMMONIA,  in the last 168  hours CBC:  Recent Labs Lab 07/24/12 1445 07/25/12 0610 07/26/12 0522 07/27/12 0625 07/29/12 0449  WBC 2.6* 2.5* 3.0* 4.5 4.1  NEUTROABS 1.9  --   --   --   --   HGB 3.9* 7.9* 8.2* 7.9* 8.2*  HCT 11.0* 21.4* 23.0* 23.1* 24.7*  MCV 127.9* 94.7 97.5 99.1 104.7*  PLT 89* 60* 58* 59* 61*   Cardiac Enzymes: No results found for this basename: CKTOTAL, CKMB, CKMBINDEX, TROPONINI,  in the last 168 hours BNP (last 3 results) No results found for this basename: PROBNP,  in the last 8760 hours CBG:  Recent Labs Lab 07/25/12 2137  GLUCAP 121*    Recent Results (from the past 240 hour(s))  MRSA PCR SCREENING     Status: None   Collection Time    07/24/12  7:40 PM      Result Value Range Status   MRSA by PCR NEGATIVE  NEGATIVE Final   Comment:            The GeneXpert MRSA Assay (FDA     approved for NASAL specimens     only), is one component of a     comprehensive MRSA colonization     surveillance program. It is not     intended to diagnose MRSA     infection nor to guide or     monitor treatment for     MRSA infections.  URINE CULTURE     Status: None   Collection Time    07/24/12  9:41 PM      Result Value Range Status   Specimen Description URINE, CLEAN CATCH   Final   Special Requests NONE   Final   Culture  Setup Time 07/24/2012 22:40   Final   Colony Count NO GROWTH   Final   Culture NO GROWTH   Final   Report Status 07/25/2012 FINAL   Final  MRSA PCR SCREENING     Status: None   Collection Time    07/25/12  7:25 AM      Result Value Range Status   MRSA by PCR NEGATIVE  NEGATIVE Final   Comment:            The GeneXpert MRSA Assay (FDA     approved for NASAL specimens     only), is one component of a     comprehensive MRSA colonization     surveillance program. It is not     intended to diagnose MRSA     infection nor to guide or     monitor treatment for     MRSA infections.     Studies: No results found.  Scheduled Meds: . antiseptic oral  rinse  15 mL Mouth Rinse BID  . enoxaparin (LOVENOX) injection  1 mg/kg Subcutaneous Q12H  . feeding supplement  237 mL Oral  BID BM  . [START ON 07/30/2012] folic acid  1 mg Oral Daily  . multivitamin with minerals  1 tablet Oral Daily  . thiamine  100 mg Oral Daily   Or  . thiamine  100 mg Intravenous Daily   Continuous Infusions:   Active Problems:   Alcoholism /alcohol abuse   Hypokalemia   Thrombocytopenia   Anemia   Hyponatremia   UTI (urinary tract infection)   IVC thrombosis    Time spent:    Zachary Fowler  [facility] Pager [phone]. If 7PM-7AM, please contact night-coverage at [email or web address], password [email or web address] 07/29/2012, 2:01 PM  LOS: 5 days

## 2012-07-29 NOTE — Progress Notes (Signed)
Was called by radiologist Dr. 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 This was communicated with Dr. Grace Isaac with interventional radiology. Plans are for IVC filter placement in the morning.

## 2012-07-29 NOTE — Progress Notes (Signed)
Aware of request for IVC filter Chart/case/imaging reviewed with IR MD. CT from 3/26 shows distal IVC thrombus. Cannot anticoagulate due to risk of bleeding. Pancytopenia, hypercoag panel pending Renal function ok. Will have [facility] bring pt to [facility] for IVC filter. Will see pt and consent upon arrival.

## 2012-07-29 NOTE — Progress Notes (Signed)
The patient is receiving folic acid by the intravenous route.  Based on criteria approved by the Pharmacy and Therapeutics Committee and the Medical Executive Committee, the medication is being converted to the equivalent oral dose form.  These criteria include: -No Active GI bleeding -Able to tolerate diet of full liquids (or better) or tube feeding OR able to tolerate other medications by the oral or enteral route  If you have any questions about this conversion, please contact the Pharmacy Department (ext [phone]).  Thank you.  Zachary Fowler, [facility] 07/29/2012 1:10 PM

## 2012-07-30 ENCOUNTER — Inpatient Hospital Stay (HOSPITAL_COMMUNITY): Payer: Medicare HMO

## 2012-07-30 ENCOUNTER — Ambulatory Visit (HOSPITAL_COMMUNITY)
Admit: 2012-07-30 | Discharge: 2012-07-30 | Disposition: A | Discharge status: Home / Self Care | Payer: Medicare HMO | Attending: Internal Medicine | Admitting: Internal Medicine

## 2012-07-30 ENCOUNTER — Encounter (HOSPITAL_COMMUNITY): Payer: Self-pay | Admitting: Radiology

## 2012-07-30 DIAGNOSIS — D759 Disease of blood and blood-forming organs, unspecified: Secondary | ICD-10-CM

## 2012-07-30 DIAGNOSIS — D510 Vitamin B12 deficiency anemia due to intrinsic factor deficiency: Secondary | ICD-10-CM

## 2012-07-30 DIAGNOSIS — E538 Deficiency of other specified B group vitamins: Secondary | ICD-10-CM

## 2012-07-30 MED ORDER — IOHEXOL 300 MG/ML  SOLN
100.0000 mL | Freq: Once | INTRAMUSCULAR | Status: AC | PRN
  Administered 2012-07-30: 40 mL via INTRAVENOUS

## 2012-07-30 MED ORDER — IOHEXOL 300 MG/ML  SOLN
100.0000 mL | Freq: Once | INTRAMUSCULAR | Status: AC | PRN
  Administered 2012-07-30: 1 mL via INTRAVENOUS

## 2012-07-30 MED ORDER — THIAMINE HCL 100 MG PO TABS: 100.0000 mg | ORAL_TABLET | Freq: Every day | ORAL | Status: DC

## 2012-07-30 MED ORDER — CYANOCOBALAMIN 1000 MCG/ML IJ SOLN: INTRAMUSCULAR | Status: DC

## 2012-07-30 MED ORDER — FENTANYL CITRATE 0.05 MG/ML IJ SOLN
INTRAMUSCULAR | Status: AC | PRN
  Administered 2012-07-30: 50 ug via INTRAVENOUS

## 2012-07-30 MED ORDER — MIDAZOLAM HCL 2 MG/2ML IJ SOLN
INTRAMUSCULAR | Status: AC | PRN
  Administered 2012-07-30: 1 mg via INTRAVENOUS

## 2012-07-30 MED ORDER — FOLIC ACID 1 MG PO TABS: 1.0000 mg | ORAL_TABLET | Freq: Every day | ORAL | Status: DC

## 2012-07-30 NOTE — ED Notes (Signed)
Patient denies pain and is resting comfortably.  

## 2012-07-30 NOTE — Discharge Summary (Addendum)
Physician Discharge Summary  CRU KRITIKOS WUJ:[id number] DOB: 11/04/1945 DOA: 07/24/2012  PCP: Kirk Ruths, MD  Admit date: 07/24/2012 Discharge date: 07/30/2012  Time spent: 45 minutes  Recommendations for Outpatient Follow-up:  1. Followup with hematology in 2-3 weeks. 2. Followup primary care doc in 2 weeks  Discharge Diagnoses:  Active Problems:   Alcoholism /alcohol abuse   Hypokalemia   Thrombocytopenia   Pernicious anemia   Hyponatremia   Folate deficiency   UTI (urinary tract infection)   IVC thrombosis   Discharge Condition: Improved  Diet recommendation: Low-salt  Filed Weights   07/24/12 1900 07/25/12 0500  Weight: 74.5 kg (164 lb 3.9 oz) 77.2 kg (170 lb 3.1 oz)    History of present illness:  Zachary Fowler is a 67 y.o. male with a history of chronic alcoholism, who presents to the emergency room with complaints of generalized weakness. Patient reports that he has had weakness for many months/years now. This is progressively gotten worse. He has noticed a significant decline over the past 3 days. He also reports possibly having some blurry vision or the past 3 days. He does not have any chest pain but has noticed worsening shortness of breath, especially on exertion. He had episode of vomiting yesterday but did not note any hematemesis or coffee-ground emesis. His last bowel movement was apparently approximately a week ago. He has not noted any significant blood or melena. He has noted that he's been bruising more easily. He has not been started on any new medicine. He reports that he may have a upper respiratory viral infection recently. He drinks alcohol on a daily basis, drinking half a bottle of vodka daily. He does not feel that he has a problem with alcohol, but his family reports that patient drinks heavily. Patient's speech is very slow and mildly slurred. family reports that this is also a chronic finding. He has chronic ataxia    Hospital Course:  This  patient was admitted to the hospital with progressive weakness and was found to be severely anemic with a hemoglobin of 3.9. He did not have any evidence of bleeding in stool occult blood was found to be negative. He was admitted to the step down unit for closer monitoring. She received 4 units of PRBCs, and hemoglobin has since stabilized. He has not required any further transfusions. The patient has a history of alcohol abuse. He does not admit that he has a problem drinking. His blood work revealed a significant thrombocytopenia as well as macrocytic anemia with an MCV approaching 130. Workup for anemia revealed significantly low folate and B12 levels. anti parietal cell antibodies are found to be negative, but intrinsic factor antibodies are found to be positive. Patient was started on intramuscular injections of vitamin B 12 as well as folate supplementation. Since he carries a new diagnosis of pernicious anemia, he will need to stay on lifelong B12 injections on a monthly basis.  Patient underwent CT scan of the chest, abdomen, pelvis to rule out any underlying malignancy that may be causing his anemia. Incidentally, a thrombosis in the IVC was detected. Patient was initially placed on Lovenox for anticoagulation. Patient was followed by hematology while in the hospital. It was felt that he was a poor candidate for long-term anticoagulation. Unfortunately, he could not afford the cost of Lovenox at home. We is ongoing alcohol abuse, it was not felt safe to start him on Coumadin. He is felt to high risk to bleed with need for agents  such as Xarelto. Lower extremity venous Dopplers was done which revealed a right lower extremity DVT. Discussed with both hematology and interventional radiology and decision was made to place an IVC filter. IVC filter was placed on 07/30/12 without complications. At this point, plan will be to discharge the patient home without any anticoagulation. The patient will followup with  hematology in the next few weeks, and if he is abstaining from alcohol, he may possibly be started on Coumadin. Hypercoagulable panel was sent and was found to be unremarkable. Plan was discussed with Dr. Mariel Sleet. Patient was strongly advised to abstain from drinking alcohol.  Patient is significantly deconditioned, but has declined any home health physical therapy. He'll be set up with home health RN to help with his B12 injections. Anticipate discharge tomorrow.  Procedures:  IVC filter placement by Dr. Fredia Sorrow on 4/1  Consultations:  Hematology/oncology  Interventional radiology  Discharge Exam: Filed Vitals:   07/30/12 1120 07/30/12 1125 07/30/12 1141 07/30/12 1200  BP: 121/67 130/68 122/69 118/64  Pulse: 69 69 58 66  Temp:      TempSrc:      Resp: 12 15 12 12   Height:      Weight:      SpO2: 98% 100% 95% 94%    General: No acute distress Cardiovascular: S1, S2, regular rate and rhythm Respiratory: Clear to auscultation bilaterally  Discharge Instructions     Medication List    TAKE these medications       benzocaine 10 % mucosal gel  Commonly known as:  ORAJEL  Use as directed 1 application in the mouth or throat as needed for pain (Ulcers in mouth).     cyanocobalamin 1000 MCG/ML injection  Commonly known as:  (VITAMIN B-12)  Vit b12 IM qweek for 4 weeks then qmonth     folic acid 1 MG tablet  Commonly known as:  FOLVITE  Take 1 tablet (1 mg total) by mouth daily.     thiamine 100 MG tablet  Take 1 tablet (100 mg total) by mouth daily.       Follow-up Information   Follow up with Randall An, MD. Schedule an appointment as soon as possible for a visit in 1 week.   Contact information:   [address] [place] [address] [phone]        The results of significant diagnostics from this hospitalization (including imaging, microbiology, ancillary and laboratory) are listed below for reference.    Significant Diagnostic  Studies: Ct Chest W Contrast  07/24/2012  *RADIOLOGY REPORT*  Clinical Data: Severe anemia.  Evaluate for underlying malignancy. Shortness of breath.  Chronic alcoholism.  CT CHEST WITH CONTRAST,CT ABDOMEN AND PELVIS WITH CONTRAST  Technique:  Multidetector CT imaging of the chest was performed following the standard protocol during bolus administration of intravenous contrast.,Technique:  Multidetector CT imaging of the abdomen and pelvis was performed following the standard protoc  Contrast: 50mL OMNIPAQUE IOHEXOL 300 MG/ML  SOLN, OMNIPAQUE IOHEXOL 300 MG/ML  SOLN  Comparison: None.  Findings: The chest wall is unremarkable.  No supraclavicular or axillary mass or adenopathy.  There are small scattered lymph nodes.  The thyroid gland is grossly normal.  The bony thorax is intact.  No destructive bone lesions or spinal canal compromise.  The heart is normal in size.  No pericardial effusion.  No mediastinal or hilar lymphadenopathy.  Small scattered lymph nodes are noted.  The esophagus is grossly normal.  The aorta is normal in  caliber.  No dissection.  Scattered atherosclerotic calcifications.  Dense coronary artery calcifications.  Examination of the lung parenchyma demonstrates no infiltrates or worrisome pulmonary lesions.  There are small bilateral pleural effusions with overlying atelectasis.  There is some fluid in the right major fissure.  IMPRESSION:  1.  Small bilateral pleural effusions with overlying atelectasis. 2.  No pulmonary lesions and no mediastinal or hilar adenopathy. 3.  Advanced coronary artery calcifications.  CT ABDOMEN/PELVIS:  The liver is unremarkable.  The gallbladder is normal.  No common bile duct dilatation.  The pancreas is normal.  The spleen is normal in size.  No focal lesions.  The adrenal glands and kidneys are unremarkable.  The stomach, duodenum, small bowel and colon are unremarkable except for chronic-appearing inflammation of the ascending colon with fibrofatty  infiltrative changes.  No mesenteric or retroperitoneal mass or adenopathy.  The aorta demonstrates moderate atherosclerotic changes.  The major branch vessels are patent.  There is thrombus in the IVC.  I do not see any definite thrombus in the iliac veins or common femoral veins. Patient is certainly at risk for pulmonary embolism.  The bladder is unremarkable.  The prostate gland and seminal vesicles appear normal.  No pelvic mass or adenopathy.  No free pelvic fluid collections.  Mild presacral edema.  The bony pelvis demonstrates evidence of remote post-traumatic changes.  Impression:  1.  Clot is noted in the IVC.  Patient at risk for pulmonary embolism. 2.  Chronic inflammatory change involving the ascending colon and terminal ileum. 3.  No mass or adenopathy.   Original Report Authenticated By: Rudie Meyer, M.D.    Ct Abdomen Pelvis W Contrast  07/24/2012  *RADIOLOGY REPORT*  Clinical Data: Severe anemia.  Evaluate for underlying malignancy. Shortness of breath.  Chronic alcoholism.  CT CHEST WITH CONTRAST,CT ABDOMEN AND PELVIS WITH CONTRAST  Technique:  Multidetector CT imaging of the chest was performed following the standard protocol during bolus administration of intravenous contrast.,Technique:  Multidetector CT imaging of the abdomen and pelvis was performed following the standard protoc  Contrast: 50mL OMNIPAQUE IOHEXOL 300 MG/ML  SOLN, OMNIPAQUE IOHEXOL 300 MG/ML  SOLN  Comparison: None.  Findings: The chest wall is unremarkable.  No supraclavicular or axillary mass or adenopathy.  There are small scattered lymph nodes.  The thyroid gland is grossly normal.  The bony thorax is intact.  No destructive bone lesions or spinal canal compromise.  The heart is normal in size.  No pericardial effusion.  No mediastinal or hilar lymphadenopathy.  Small scattered lymph nodes are noted.  The esophagus is grossly normal.  The aorta is normal in caliber.  No dissection.  Scattered atherosclerotic  calcifications.  Dense coronary artery calcifications.  Examination of the lung parenchyma demonstrates no infiltrates or worrisome pulmonary lesions.  There are small bilateral pleural effusions with overlying atelectasis.  There is some fluid in the right major fissure.  IMPRESSION:  1.  Small bilateral pleural effusions with overlying atelectasis. 2.  No pulmonary lesions and no mediastinal or hilar adenopathy. 3.  Advanced coronary artery calcifications.  CT ABDOMEN/PELVIS:  The liver is unremarkable.  The gallbladder is normal.  No common bile duct dilatation.  The pancreas is normal.  The spleen is normal in size.  No focal lesions.  The adrenal glands and kidneys are unremarkable.  The stomach, duodenum, small bowel and colon are unremarkable except for chronic-appearing inflammation of the ascending colon with fibrofatty infiltrative changes.  No mesenteric or retroperitoneal mass  or adenopathy.  The aorta demonstrates moderate atherosclerotic changes.  The major branch vessels are patent.  There is thrombus in the IVC.  I do not see any definite thrombus in the iliac veins or common femoral veins. Patient is certainly at risk for pulmonary embolism.  The bladder is unremarkable.  The prostate gland and seminal vesicles appear normal.  No pelvic mass or adenopathy.  No free pelvic fluid collections.  Mild presacral edema.  The bony pelvis demonstrates evidence of remote post-traumatic changes.  Impression:  1.  Clot is noted in the IVC.  Patient at risk for pulmonary embolism. 2.  Chronic inflammatory change involving the ascending colon and terminal ileum. 3.  No mass or adenopathy.   Original Report Authenticated By: Rudie Meyer, M.D.    Ir Ivc Filter Plmt / S&i /img Guid/mod Sed  07/30/2012  *RADIOLOGY REPORT*  Clinical Data:  Right lower extremity DVT and IVC thrombus.  The patient is a poor candidate for anticoagulation due to anemia, bleeding and low platelet count.  1.  ULTRASOUND GUIDANCE FOR  VASCULAR ACCESS OF THE RIGHT INTERNAL JUGULAR VEIN. 2.  IVC VENOGRAM 3.  PERCUTANEOUS IVC FILTER PLACEMENT  Sedation:  1.0 mg IV Versed; 50 mcg IV Fentanyl.  Total Moderate Sedation Time:  15 minutes.  Contrast:  40 ml Omnipaque-300  Fluoroscopy Time: 1.5 minutes.  Procedure:  The procedure, risks, benefits, and alternatives were explained to the patient.  Questions regarding the procedure were encouraged and answered.  The patient understands and consents to the procedure.  The right neck was prepped with Betadine in a sterile fashion, and a sterile drape was applied covering the operative field.  A sterile gown and sterile gloves were used for the procedure.  Local anesthesia was provided with 1% Lidocaine.  Under direct ultrasound guidance, a 21 gauge needle was advanced into the right internal vein with ultrasound image documentation performed.  After securing access with a micropuncture dilator, a guidewire was advanced into the inferior vena cava.  A deployment sheath was advanced over the guidewire.  This was utilized to perform IVC venography.  The deployment sheath was further positioned in an appropriate location for filter deployment.  A Bard Denali IVC filter was then advanced in the sheath.  This was then fully deployed in the infrarenal IVC.  Final filter position was confirmed with a fluoroscopic spot image.  Contrast injection was also performed through the sheath under fluoroscopy to confirm patency of the IVC at the level of the filter.  After the procedure the sheath was removed and hemostasis obtained with manual compression.  Complications: None  Findings:  IVC venography shows a normal caliber IVC.  The presence of nonocclusive tubular thrombus in the lower segment of the IVC is confirmed by venography.  There was room in the infrarenal IVC to place the IVC filter above the level of thrombus and below the level of the renal veins.  The IVC filter was successfully positioned below the level of  the renal veins and is appropriately oriented.  This IVC filter has both permanent and retrievable indications.  IMPRESSION: Placement of percutaneous IVC filter in infrarenal IVC.  IVC venography confirms the presence of nonocclusive thrombus in the lower IVC.  The filter was able to be positioned in the infrarenal IVC above the level of IVC thrombus.  This filter does have both permanent and retrievable indications.   Original Report Authenticated By: Irish Lack, M.D.    US Venous Img Lower Bilateral  07/29/2012  *RADIOLOGY REPORT*  Clinical Data: 67 year old male with IVC thrombosis.  VENOUS DUPLEX ULTRASOUND OF BILATERAL LOWER EXTREMITIES  Technique:  Gray-scale sonography with graded compression, as well as color Doppler and duplex ultrasound, were performed to evaluate the deep venous system of both lower extremities from the level of the common femoral vein through the popliteal and proximal calf veins.  Spectral Doppler was utilized to evaluate flow at rest and with distal augmentation maneuvers.  Comparison:  CT chest abdomen and pelvis 07/24/2012.  Findings: Right: Web like area of increased echogenicity in the right common femoral vein near the greater saphenous vein junction (image 4). Nevertheless, both vessels are patent. Right profunda femoral vein cannot be fully compressed with increased echogenic material within the lumen.  This vessel also remains patent.  Similar findings in the right femoral vein. Similar findings in the right popliteal vein. Right greater saphenous vein is compressible.  Right calf veins appear grossly patent.  Left: Normal compressibility of the left common femoral, superficial femoral, and popliteal veins is demonstrated, as well as the visualized proximal calf veins.  No filling defects to suggest DVT on grayscale or color Doppler imaging.  Doppler waveforms show normal  response to augmentation.   Left greater saphenous vein is compressible.  IMPRESSION: 1.  Partial  thrombosis of the right deep venous system from the right common femoral vein through the right popliteal vein.  No occlusive lower extremity thrombus is identified. 2.  No left lower extremity DVT.  Study discussed by telephone with Dr. Kerry Hough on 07/29/2012 at 1655 hours.   Original Report Authenticated By: Erskine Speed, M.D.     Microbiology: Recent Results (from the past 240 hour(s))  MRSA PCR SCREENING     Status: None   Collection Time    07/24/12  7:40 PM      Result Value Range Status   MRSA by PCR NEGATIVE  NEGATIVE Final   Comment:            The GeneXpert MRSA Assay (FDA     approved for NASAL specimens     only), is one component of a     comprehensive MRSA colonization     surveillance program. It is not     intended to diagnose MRSA     infection nor to guide or     monitor treatment for     MRSA infections.  URINE CULTURE     Status: None   Collection Time    07/24/12  9:41 PM      Result Value Range Status   Specimen Description URINE, CLEAN CATCH   Final   Special Requests NONE   Final   Culture  Setup Time 07/24/2012 22:40   Final   Colony Count NO GROWTH   Final   Culture NO GROWTH   Final   Report Status 07/25/2012 FINAL   Final  MRSA PCR SCREENING     Status: None   Collection Time    07/25/12  7:25 AM      Result Value Range Status   MRSA by PCR NEGATIVE  NEGATIVE Final   Comment:            The GeneXpert MRSA Assay (FDA     approved for NASAL specimens     only), is one component of a     comprehensive MRSA colonization     surveillance program. It is not     intended to diagnose MRSA  infection nor to guide or     monitor treatment for     MRSA infections.     Labs: Basic Metabolic Panel:  Recent Labs Lab 07/24/12 1445 07/24/12 1932 07/25/12 0610 07/26/12 0522  NA 132*  --  133* 138  K 2.7*  --  3.3* 3.5  CL 88*  --  93* 98  CO2 32  --  33* 35*  GLUCOSE 125*  --  105* 101*  BUN 16  --  12 8  CREATININE 0.48*  --  0.46* 0.49*   CALCIUM 8.6  --  7.8* 8.2*  MG  --  1.1*  --   --    Liver Function Tests:  Recent Labs Lab 07/24/12 1445 07/25/12 0610  AST 50* 48*  ALT 33 28  ALKPHOS 77 64  BILITOT 2.1* 2.5*  PROT 6.1 5.3*  ALBUMIN 3.1* 2.7*   No results found for this basename: LIPASE, AMYLASE,  in the last 168 hours No results found for this basename: AMMONIA,  in the last 168 hours CBC:  Recent Labs Lab 07/24/12 1445 07/25/12 0610 07/26/12 0522 07/27/12 0625 07/29/12 0449  WBC 2.6* 2.5* 3.0* 4.5 4.1  NEUTROABS 1.9  --   --   --   --   HGB 3.9* 7.9* 8.2* 7.9* 8.2*  HCT 11.0* 21.4* 23.0* 23.1* 24.7*  MCV 127.9* 94.7 97.5 99.1 104.7*  PLT 89* 60* 58* 59* 61*   Cardiac Enzymes: No results found for this basename: CKTOTAL, CKMB, CKMBINDEX, TROPONINI,  in the last 168 hours BNP: BNP (last 3 results) No results found for this basename: PROBNP,  in the last 8760 hours CBG:  Recent Labs Lab 07/25/12 2137  GLUCAP 121*       Signed:  MEMON,JEHANZEB  [facility] 07/30/2012, 5:36 PM

## 2012-07-30 NOTE — Procedures (Signed)
Procedure:  IVC filter placement Findings:  IVC venography via right IJ vein confirms thrombus in lower IVC.  Bard Stanfield IVC filter successfully deployed in infrarenal IVC above thrombus.

## 2012-07-30 NOTE — H&P (Signed)
HPI: Zachary Fowler is an 67 y.o. male with profound anemia and thrombocytopenia, but also found to have distal IVC thrombus. A LE duplex was performed and also found rt LE DVT. The pt is high risk for anticoagulation and therefore, IR is requested to place IVC filter. Chart, labs, meds reviewed.  Past Medical History:  Past Medical History  Diagnosis Date  . Hypertension   . Thrombocytopenia 02/17/2011  . Anemia 02/17/2011  . Hyponatremia 02/17/2011  . Hypomagnesemia 02/17/2011  . Hypophosphatemia 02/17/2011  . Subdural hematoma 02/17/2011  . Hypogonadism male 02/17/2011    Low testosterone level.    Past Surgical History:  Past Surgical History  Procedure Laterality Date  . Cervical disc surgery      X2  . Lumbar disc surgery      X2    Family History: History reviewed. No pertinent family history.  Social History:  reports that he has been smoking.  He does not have any smokeless tobacco history on file. He reports that  drinks alcohol. He reports that he does not use illicit drugs.  Allergies: No Known Allergies  Medications: Medications Prior to Admission  Medication Sig Dispense Refill  . benzocaine (ORAJEL) 10 % mucosal gel Use as directed 1 application in the mouth or throat as needed for pain (Ulcers in mouth).        Please HPI for pertinent positives, otherwise complete 10 system ROS negative.  Physical Exam: Blood pressure 118/71, pulse 66, temperature 97.9 F (36.6 C), temperature source Oral, resp. rate 20, height 5\' 10"  (1.778 m), weight 170 lb 3.1 oz (77.2 kg), SpO2 98.00%. Body mass index is 24.42 kg/(m^2).   General Appearance:  Alert, cooperative, no distress, appears stated age  Head:  Normocephalic, without obvious abnormality, atraumatic  ENT: Unremarkable  Neck: Supple, symmetrical, trachea midline, no adenopathy, thyroid: not enlarged, symmetric, no tenderness/mass/nodules  Lungs:   Clear to auscultation bilaterally, no w/r/r, respirations  unlabored without use of accessory muscles.  Heart:  Regular rate and rhythm, S1, S2 normal, no murmur, rub or gallop.  Extremities: Extremities normal, atraumatic, no appreciable edema  Neurologic: Normal affect, no gross deficits.   Results for orders placed during the hospital encounter of 07/24/12 (from the past 48 hour(s))  CBC     Status: Abnormal   Collection Time    07/29/12  4:49 AM      Result Value Range   WBC 4.1  4.0 - 10.5 K/uL   RBC 2.36 (*) 4.22 - 5.81 MIL/uL   Hemoglobin 8.2 (*) 13.0 - 17.0 g/dL   HCT 40.9 (*) 81.1 - 91.4 %   MCV 104.7 (*) 78.0 - 100.0 fL   MCH 34.7 (*) 26.0 - 34.0 pg   MCHC 33.2  30.0 - 36.0 g/dL   RDW 78.2 (*) 95.6 - 21.3 %   Platelets 61 (*) 150 - 400 K/uL   BMET    Component Value Date/Time   NA 138 07/26/2012 0522   K 3.5 07/26/2012 0522   CL 98 07/26/2012 0522   CO2 35* 07/26/2012 0522   GLUCOSE 101* 07/26/2012 0522   BUN 8 07/26/2012 0522   CREATININE 0.49* 07/26/2012 0522   CALCIUM 8.2* 07/26/2012 0522   GFRNONAA >90 07/26/2012 0522   GFRAA >90 07/26/2012 0522     US Venous Img Lower Bilateral  07/29/2012  *RADIOLOGY REPORT*  Clinical Data: 67 year old male with IVC thrombosis.  VENOUS DUPLEX ULTRASOUND OF BILATERAL LOWER EXTREMITIES  Technique:  Gray-scale sonography with  graded compression, as well as color Doppler and duplex ultrasound, were performed to evaluate the deep venous system of both lower extremities from the level of the common femoral vein through the popliteal and proximal calf veins.  Spectral Doppler was utilized to evaluate flow at rest and with distal augmentation maneuvers.  Comparison:  CT chest abdomen and pelvis 07/24/2012.  Findings: Right: Web like area of increased echogenicity in the right common femoral vein near the greater saphenous vein junction (image 4). Nevertheless, both vessels are patent. Right profunda femoral vein cannot be fully compressed with increased echogenic material within the lumen.  This vessel  also remains patent.  Similar findings in the right femoral vein. Similar findings in the right popliteal vein. Right greater saphenous vein is compressible.  Right calf veins appear grossly patent.  Left: Normal compressibility of the left common femoral, superficial femoral, and popliteal veins is demonstrated, as well as the visualized proximal calf veins.  No filling defects to suggest DVT on grayscale or color Doppler imaging.  Doppler waveforms show normal  response to augmentation.   Left greater saphenous vein is compressible.  IMPRESSION: 1.  Partial thrombosis of the right deep venous system from the right common femoral vein through the right popliteal vein.  No occlusive lower extremity thrombus is identified. 2.  No left lower extremity DVT.  Study discussed by telephone with Dr. Kerry Hough on 07/29/2012 at 1655 hours.   Original Report Authenticated By: Erskine Speed, M.D.     Assessment/Plan Distal IVC and Rt LE DVT High risk for anticoagulation due to EtOH abuse. Discussed IVC filter placement with pt. Explained procedure, risks, complications, and possibility of retrieval. Labs reviewed, ok. Consent signed in chart  Brayton El PA-C 07/30/2012, 10:35 AM

## 2012-07-30 NOTE — Progress Notes (Signed)
UR Chart Review Completed  

## 2012-07-30 NOTE — Progress Notes (Signed)
Subjective: Patient seen in bed comfortably.  He just returned from IR where he had an IVC filter placed.   He denies any complaints today.  I personally reviewed and went over laboratory results with the patient.  He was educated on pernicious anemia.  He was educated on folate deficiency.  He was educated on the role of EtOHism played in bone marrow suppression.   The patient was educated on the conundrum of anticoagulation.  He was educated that he would be difficult to manage on Coumadin due to noncompliance in the past and his EtOH.  He explains to me that it should not be an "issue because I have been told that I do not have cirrhosis of the liver yet."  He was educated on this comment and educated on the effect EtOH has on medications and their metabolism.  He reports that he is not addicted to EtOH and as evidence of that he did not have withdrawal symptoms.  Hopefully, for his sake, he can quit drinking and participate more in his health care.   Objective: Vital signs in last 24 hours: Temp:  [97.9 F (36.6 C)-98.6 F (37 C)] 97.9 F (36.6 C) (04/01 0615) Pulse Rate:  [58-73] 66 (04/01 1200) Resp:  [12-20] 12 (04/01 1200) BP: (114-130)/(62-75) 118/64 mmHg (04/01 1200) SpO2:  [94 %-100 %] 94 % (04/01 1200)  Intake/Output from previous day: 03/31 0800 - 04/01 0759 In: 480 [P.O.:480] Out: 1500 [Urine:1500] Intake/Output this shift:    General appearance: alert, cooperative, appears older than stated age, slowed mentation and slurred speech.  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  The patient is a poor candidate for anticoagulation due to anemia, bleeding  and low platelet count.  1.  ULTRASOUND GUIDANCE FOR VASCULAR ACCESS OF THE RIGHT INTERNAL JUGULAR VEIN. 2.  IVC VENOGRAM 3.  PERCUTANEOUS IVC FILTER PLACEMENT  Sedation:  1.0 mg IV Versed; 50 mcg IV Fentanyl.  Total Moderate Sedation Time:  15 minutes.  Contrast:  40 ml Omnipaque-300  Fluoroscopy Time: 1.5 minutes.  Procedure:  The procedure, risks, benefits, and alternatives were explained to the patient.  Questions regarding the procedure were encouraged and answered.  The patient understands and consents to the procedure.  The right neck was prepped with Betadine in a sterile fashion, and a sterile drape was applied covering the operative field.  A sterile gown and sterile gloves were used for the procedure.  Local anesthesia was provided with 1% Lidocaine.  Under direct ultrasound guidance, a 21 gauge needle was advanced into the right internal vein with ultrasound image documentation performed.  After securing access with a micropuncture dilator, a guidewire was advanced into the inferior vena cava.  A deployment sheath was advanced over the guidewire.  This was utilized to perform IVC venography.  The deployment sheath was further positioned in an appropriate location for filter deployment.  A Bard Denali IVC filter was then advanced in the sheath.  This was then fully deployed in the infrarenal IVC.  Final filter position was confirmed with a fluoroscopic spot image.  Contrast injection was also performed through the sheath under  fluoroscopy to confirm patency of the IVC at the level of the filter.  After the procedure the sheath was removed and hemostasis obtained with manual compression.  Complications: None  Findings:  IVC venography shows a normal caliber IVC.  The presence of nonocclusive tubular thrombus in the lower segment of the IVC is confirmed by venography.  There was room in the infrarenal IVC to place the IVC filter above the level of thrombus and below the level of the renal veins.  The IVC  filter was successfully positioned below the level of the renal veins and is appropriately oriented.  This IVC filter has both permanent and retrievable indications.  IMPRESSION: Placement of percutaneous IVC filter in infrarenal IVC.  IVC venography confirms the presence of nonocclusive thrombus in the lower IVC.  The filter was able to be positioned in the infrarenal IVC above the level of IVC thrombus.  This filter does have both permanent and retrievable indications.   Original Report Authenticated By: Irish Lack, M.D.    US Venous Img Lower Bilateral  07/29/2012  *RADIOLOGY REPORT*  Clinical Data: 67 year old male with IVC thrombosis.  VENOUS DUPLEX ULTRASOUND OF BILATERAL LOWER EXTREMITIES  Technique:  Gray-scale sonography with graded compression, as well as color Doppler and duplex ultrasound, were performed to evaluate the deep venous system of both lower extremities from the level of the common femoral vein through the popliteal and proximal calf veins.  Spectral Doppler was utilized to evaluate flow at rest and with distal augmentation maneuvers.  Comparison:  CT chest abdomen and pelvis 07/24/2012.  Findings: Right: Web like area of increased echogenicity in the right common femoral vein near the greater saphenous vein junction (image 4). Nevertheless, both vessels are patent. Right profunda femoral vein cannot be fully compressed with increased echogenic material within the lumen.  This vessel also remains patent.  Similar findings in the right femoral vein. Similar findings in the right popliteal vein. Right greater saphenous vein is compressible.  Right calf veins appear grossly patent.  Left: Normal compressibility of the left common femoral, superficial femoral, and popliteal veins is demonstrated, as well as the visualized proximal calf veins.  No filling defects to suggest DVT on grayscale or color Doppler imaging.  Doppler waveforms show normal  response to augmentation.   Left greater  saphenous vein is compressible.  IMPRESSION: 1.  Partial thrombosis of the right deep venous system from the right common femoral vein through the right popliteal vein.  No occlusive lower extremity thrombus is identified. 2.  No left lower extremity DVT.  Study discussed by telephone with Dr. Kerry Hough on 07/29/2012 at 1655 hours.   Original Report Authenticated By: Erskine Speed, M.D.     Medications: I have reviewed the patient's current medications.  Assessment/Plan: 1. 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 S/P 3 days of 1000 mcg of SQ Vitamin B12.  He will need Vitamin B12 1000 mcg SQ weekly x 4 and then monthly.  He is due for his next injection on 08/01/12 and then weekly as described. 2. 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  This needs to continue as an outpatient.  3. 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 4. 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  S/P IVC Filter on 07/30/2012 by IR.  Recommend Lovenox 1 mg/kg BID as an outpatient with possible transition to Coumadin PO.  HOWEVER, compliance and his EtOHism will  be an issue with managing anticoagulation.  It will be safer for the patient to have Lovenox 1 mg/kg BID with the assistance of home health. Patient is not a good candidate for Xarelto. 5. Strongly encouraged the patient to quit EtOH abuse.   Patient and plan discussed with Dr. Glenford Peers and he is in agreement with the aforementioned.    LOS: 6 days    KEFALAS,THOMAS 07/30/2012  The patient has clearly told us he has no intention of quitting alcohol use or of decreasing his intake. His compliance is the major issue therefore.  He has had so many problems uncovered during this hospitalization that he is at major risk of dying of one of these issues if he does not comply. I think he has heard this but does not believe it.

## 2012-07-30 NOTE — H&P (Signed)
Agree 

## 2012-07-31 NOTE — Progress Notes (Signed)
Patient with orders to be discharge home. Discharge instructions given to patient, patient verbalized understanding. Patient in stable condition upon discharge. Patient via [facility].

## 2012-07-31 NOTE — Clinical Social Work Note (Signed)
CSW set up transportation via [facility] and they will call RN with pickup time. Pt has wheelchair and $3. No other needs reported.  Derenda Fennel, [place] [phone]

## 2012-07-31 NOTE — Progress Notes (Signed)
[facility] PROGRESS NOTE  Zachary Fowler JXB:[id number] DOB: 07/26/1945 DOA: 07/24/2012 PCP: Kirk Ruths, MD  Assessment/Plan: 1. Pancytopenia with profound macrocytic anemia on admission: Status post 4 units packed red blood cells with stabilization of hemoglobin. No signs of bleeding in stool Hemoccult negative. 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  2. Pernicious anemia with positive intrinsic factor antibody: S/P 3 days of 1000 mcg of SQ Vitamin B12. He will need Vitamin B12 1000 mcg SQ weekly x 4 and then monthly. He is due for his next injection on 08/01/12 and then weekly as described. 3. Folate deficiency: Oral folate 1 mg daily. 4. 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 No IVC filter recommended initially. However patient unable to afford Lovenox and was not felt to be a candidate for Coumadin or a newer oral anticoagulant. Hematology thus recommended IVC filter placement. Discussed today with [name] with discharge home with no anticoagulation given inability to afford Lovenox and no other recommended options. 5. Right lower extremity DVT: Status post IVC filter placement. 6. Alcoholism: Counseled extensively on cessation. 7. Generalized weakness: Refused home health. 8. Chronic ataxia and slurred speech  Code Status: DO NOT RESUSCITATE Family Communication: None present Disposition Plan: Home  [name], MD  [facility]  Pager [phone] If 7PM-7AM, please contact night-coverage at [email or web address], password [facility] 07/31/2012, 12:12 PM  LOS: 7 days   Brief narrative: 67 year old man with history of alcoholism presented with generalized weakness. Initial evaluation was notable for profound anemia thought to be secondary to chronic alcohol use. Other issues included leukopenia and  thrombocytopenia. He was seen in consultation with hematology and further workup revealed pernicious anemia folate deficiency. He underwent transfusion packed red blood cells with stabilization of blood count. If cycle regulation was recommended for incidental finding of IVC clot however Lovenox was cost prohibitive and the patient was not felt to be a candidate for Coumadin or Xarelto also because of his alcoholism. Therefore IVC filter was placed. Patient was extensively counseled on discontinuing alcohol but does not appear to be interested.  Consultants:  Hematology   Physical therapy: Home health PT   Procedures:   IVC filter placement 4/1  HPI/Subjective: Afebrile, VSS. Feels fine. No complaints.  Objective: Filed Vitals:   07/30/12 1141 07/30/12 1200 07/30/12 2217 07/31/12 0542  BP: 122/69 118/64 125/69 126/67  Pulse: 58 66 65 70  Temp:   98.3 F (36.8 C) 98 F (36.7 C)  TempSrc:   Oral Oral  Resp: 12 12 19 18   Height:      Weight:      SpO2: 95% 94% 98% 97%   No intake or output data in the 24 hours ending 07/31/12 1212 Filed Weights   07/24/12 1900 07/25/12 0500  Weight: 74.5 kg (164 lb 3.9 oz) 77.2 kg (170 lb 3.1 oz)    Exam:  General:  Appears calm and comfortable Cardiovascular: RRR, no m/r/g. Respiratory: CTA bilaterally, no w/r/r. Normal respiratory effort.  Data Reviewed: Basic Metabolic Panel:  Recent Labs Lab 07/24/12 1445 07/24/12 1932 07/25/12 0610 07/26/12 0522  NA 132*  --  133* 138  K 2.7*  --  3.3* 3.5  CL 88*  --  93* 98  CO2 32  --  33* 35*  GLUCOSE 125*  --  105* 101*  BUN 16  --  12 8  CREATININE 0.48*  --  0.46* 0.49*  CALCIUM 8.6  --  7.8* 8.2*  MG  --  1.1*  --   --    Liver Function Tests:  Recent Labs Lab 07/24/12 1445 07/25/12 0610  AST 50* 48*  ALT 33 28  ALKPHOS 77 64  BILITOT 2.1* 2.5*  PROT 6.1 5.3*  ALBUMIN 3.1* 2.7*   CBC:  Recent Labs Lab 07/24/12 1445 07/25/12 0610 07/26/12 0522 07/27/12 0625  07/29/12 0449  WBC 2.6* 2.5* 3.0* 4.5 4.1  NEUTROABS 1.9  --   --   --   --   HGB 3.9* 7.9* 8.2* 7.9* 8.2*  HCT 11.0* 21.4* 23.0* 23.1* 24.7*  MCV 127.9* 94.7 97.5 99.1 104.7*  PLT 89* 60* 58* 59* 61*   CBG:  Recent Labs Lab 07/25/12 2137  GLUCAP 121*    Recent Results (from the past 240 hour(s))  MRSA PCR SCREENING     Status: None   Collection Time    07/24/12  7:40 PM      Result Value Range Status   MRSA by PCR NEGATIVE  NEGATIVE Final   Comment:            The GeneXpert MRSA Assay (FDA     approved for NASAL specimens     only), is one component of a     comprehensive MRSA colonization     surveillance program. It is not     intended to diagnose MRSA     infection nor to guide or     monitor treatment for     MRSA infections.  URINE CULTURE     Status: None   Collection Time    07/24/12  9:41 PM      Result Value Range Status   Specimen Description URINE, CLEAN CATCH   Final   Special Requests NONE   Final   Culture  Setup Time 07/24/2012 22:40   Final   Colony Count NO GROWTH   Final   Culture NO GROWTH   Final   Report Status 07/25/2012 FINAL   Final  MRSA PCR SCREENING     Status: None   Collection Time    07/25/12  7:25 AM      Result Value Range Status   MRSA by PCR NEGATIVE  NEGATIVE Final   Comment:            The GeneXpert MRSA Assay (FDA     approved for NASAL specimens     only), is one component of a     comprehensive MRSA colonization     surveillance program. It is not     intended to diagnose MRSA     infection nor to guide or     monitor treatment for     MRSA infections.     Studies: Ir Ivc Filter Plmt / S&i /img Guid/mod Sed  07/30/2012  *RADIOLOGY REPORT*  Clinical Data:  Right lower extremity DVT and IVC thrombus.  The patient is a poor candidate for anticoagulation due to anemia, bleeding and low platelet count.  1.  ULTRASOUND GUIDANCE FOR VASCULAR ACCESS OF THE RIGHT INTERNAL JUGULAR VEIN. 2.  IVC VENOGRAM 3.  PERCUTANEOUS IVC  FILTER PLACEMENT  Sedation:  1.0 mg IV Versed; 50 mcg IV Fentanyl.  Total Moderate Sedation Time:  15 minutes.  Contrast:  40 ml Omnipaque-300  Fluoroscopy Time: 1.5 minutes.  Procedure:  The procedure, risks, benefits, and alternatives were explained to the patient.  Questions regarding the procedure were encouraged  and answered.  The patient understands and consents to the procedure.  The right neck was prepped with Betadine in a sterile fashion, and a sterile drape was applied covering the operative field.  A sterile gown and sterile gloves were used for the procedure.  Local anesthesia was provided with 1% Lidocaine.  Under direct ultrasound guidance, a 21 gauge needle was advanced into the right internal vein with ultrasound image documentation performed.  After securing access with a micropuncture dilator, a guidewire was advanced into the inferior vena cava.  A deployment sheath was advanced over the guidewire.  This was utilized to perform IVC venography.  The deployment sheath was further positioned in an appropriate location for filter deployment.  A Bard Denali IVC filter was then advanced in the sheath.  This was then fully deployed in the infrarenal IVC.  Final filter position was confirmed with a fluoroscopic spot image.  Contrast injection was also performed through the sheath under fluoroscopy to confirm patency of the IVC at the level of the filter.  After the procedure the sheath was removed and hemostasis obtained with manual compression.  Complications: None  Findings:  IVC venography shows a normal caliber IVC.  The presence of nonocclusive tubular thrombus in the lower segment of the IVC is confirmed by venography.  There was room in the infrarenal IVC to place the IVC filter above the level of thrombus and below the level of the renal veins.  The IVC filter was successfully positioned below the level of the renal veins and is appropriately oriented.  This IVC filter has both permanent and  retrievable indications.  IMPRESSION: Placement of percutaneous IVC filter in infrarenal IVC.  IVC venography confirms the presence of nonocclusive thrombus in the lower IVC.  The filter was able to be positioned in the infrarenal IVC above the level of IVC thrombus.  This filter does have both permanent and retrievable indications.   Original Report Authenticated By: Irish Lack, M.D.    US Venous Img Lower Bilateral  07/29/2012  *RADIOLOGY REPORT*  Clinical Data: 67 year old male with IVC thrombosis.  VENOUS DUPLEX ULTRASOUND OF BILATERAL LOWER EXTREMITIES  Technique:  Gray-scale sonography with graded compression, as well as color Doppler and duplex ultrasound, were performed to evaluate the deep venous system of both lower extremities from the level of the common femoral vein through the popliteal and proximal calf veins.  Spectral Doppler was utilized to evaluate flow at rest and with distal augmentation maneuvers.  Comparison:  CT chest abdomen and pelvis 07/24/2012.  Findings: Right: Web like area of increased echogenicity in the right common femoral vein near the greater saphenous vein junction (image 4). Nevertheless, both vessels are patent. Right profunda femoral vein cannot be fully compressed with increased echogenic material within the lumen.  This vessel also remains patent.  Similar findings in the right femoral vein. Similar findings in the right popliteal vein. Right greater saphenous vein is compressible.  Right calf veins appear grossly patent.  Left: Normal compressibility of the left common femoral, superficial femoral, and popliteal veins is demonstrated, as well as the visualized proximal calf veins.  No filling defects to suggest DVT on grayscale or color Doppler imaging.  Doppler waveforms show normal  response to augmentation.   Left greater saphenous vein is compressible.  IMPRESSION: 1.  Partial thrombosis of the right deep venous system from the right common femoral vein through  the right popliteal vein.  No occlusive lower extremity thrombus is identified. 2.  No  left lower extremity DVT.  Study discussed by telephone with Dr. Kerry Hough on 07/29/2012 at 1655 hours.   Original Report Authenticated By: Erskine Speed, M.D.     Scheduled Meds: . antiseptic oral rinse  15 mL Mouth Rinse BID  . enoxaparin (LOVENOX) injection  1 mg/kg Subcutaneous Q12H  . feeding supplement  237 mL Oral BID BM  . folic acid  1 mg Oral Daily  . multivitamin with minerals  1 tablet Oral Daily  . thiamine  100 mg Oral Daily   Or  . thiamine  100 mg Intravenous Daily   Continuous Infusions:   Active Problems:   Alcoholism /alcohol abuse   Hypokalemia   Thrombocytopenia   Pernicious anemia   Hyponatremia   Folate deficiency   UTI (urinary tract infection)   IVC thrombosis     [name], MD  [facility] Pager [phone] If 7PM-7AM, please contact night-coverage at [email or web address], password [facility] 07/31/2012, 12:12 PM  LOS: 7 days   Time spent: 25 minutes

## 2012-08-06 ENCOUNTER — Ambulatory Visit (HOSPITAL_COMMUNITY): Payer: Medicare Other | Admitting: Oncology

## 2012-09-03 ENCOUNTER — Encounter (HOSPITAL_COMMUNITY): Payer: Self-pay

## 2012-09-05 ENCOUNTER — Ambulatory Visit (INDEPENDENT_AMBULATORY_CARE_PROVIDER_SITE_OTHER): Payer: Medicare HMO | Admitting: Otolaryngology

## 2012-09-05 DIAGNOSIS — D3701 Neoplasm of uncertain behavior of lip: Secondary | ICD-10-CM

## 2012-09-06 ENCOUNTER — Other Ambulatory Visit (INDEPENDENT_AMBULATORY_CARE_PROVIDER_SITE_OTHER): Payer: Self-pay | Admitting: Otolaryngology

## 2012-09-06 DIAGNOSIS — J359 Chronic disease of tonsils and adenoids, unspecified: Secondary | ICD-10-CM

## 2012-09-06 DIAGNOSIS — R221 Localized swelling, mass and lump, neck: Secondary | ICD-10-CM

## 2012-09-10 ENCOUNTER — Ambulatory Visit (HOSPITAL_COMMUNITY)
Admission: RE | Admit: 2012-09-10 | Discharge: 2012-09-10 | Disposition: A | Discharge status: Home / Self Care | Payer: Medicare HMO | Source: Ambulatory Visit | Attending: Otolaryngology | Admitting: Otolaryngology

## 2012-09-10 ENCOUNTER — Encounter (HOSPITAL_COMMUNITY): Payer: Self-pay

## 2012-09-10 DIAGNOSIS — R221 Localized swelling, mass and lump, neck: Secondary | ICD-10-CM

## 2012-09-10 DIAGNOSIS — R220 Localized swelling, mass and lump, head: Secondary | ICD-10-CM | POA: Insufficient documentation

## 2012-09-10 DIAGNOSIS — J359 Chronic disease of tonsils and adenoids, unspecified: Secondary | ICD-10-CM

## 2012-09-10 MED ORDER — IOHEXOL 300 MG/ML  SOLN
75.0000 mL | Freq: Once | INTRAMUSCULAR | Status: AC | PRN
  Administered 2012-09-10: 75 mL via INTRAVENOUS

## 2012-09-12 ENCOUNTER — Ambulatory Visit (INDEPENDENT_AMBULATORY_CARE_PROVIDER_SITE_OTHER): Payer: Medicare HMO | Admitting: Otolaryngology

## 2012-09-12 DIAGNOSIS — C090 Malignant neoplasm of tonsillar fossa: Secondary | ICD-10-CM

## 2012-09-16 ENCOUNTER — Encounter (HOSPITAL_COMMUNITY): Payer: Self-pay

## 2012-09-16 ENCOUNTER — Encounter (HOSPITAL_COMMUNITY): Payer: Medicare HMO | Attending: Hematology and Oncology

## 2012-09-16 VITALS — BP 136/81 | HR 88 | Temp 97.7°F | Resp 20 | Ht 70.0 in | Wt 143.3 lb

## 2012-09-16 DIAGNOSIS — C760 Malignant neoplasm of head, face and neck: Secondary | ICD-10-CM

## 2012-09-16 DIAGNOSIS — B370 Candidal stomatitis: Secondary | ICD-10-CM

## 2012-09-16 DIAGNOSIS — D649 Anemia, unspecified: Secondary | ICD-10-CM

## 2012-09-16 DIAGNOSIS — F1020 Alcohol dependence, uncomplicated: Secondary | ICD-10-CM

## 2012-09-16 MED ORDER — NYSTATIN 100000 UNIT/ML MT SUSP: 500000.0000 [IU] | Freq: Four times a day (QID) | OROMUCOSAL | Status: DC

## 2012-09-16 MED ORDER — LIDOCAINE VISCOUS 2 % MT SOLN: 5.0000 mL | OROMUCOSAL | Status: DC | PRN

## 2012-09-16 NOTE — Progress Notes (Deleted)
Patient History and Physical   Zachary Fowler [id number] 07-14-1945 67 y.o. 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 He had Jaw pain over the past 4 months prior to the diagnosis. He has a protracted history of heavy alcohol consumption and up to 100 pack years of smoking. He has been wheelchair confined for up to 5 years reportedly due to alcohol related neurologic problems.  He tell me that presently he is unable to chew and swallow food well due to mouth  and throat pain. However he is able to eat mashed potatoes with straw and drinks ensure. He uses Orajel with some relief for his oropharyngeal pain . Reports nausea with percocet .    PMH: Past Medical History  Diagnosis Date  . Hypertension   . Thrombocytopenia 02/17/2011  . Anemia 02/17/2011  . Hyponatremia 02/17/2011  . Hypomagnesemia 02/17/2011  . Hypophosphatemia 02/17/2011  . Subdural hematoma 02/17/2011  . Hypogonadism male 02/17/2011    Low testosterone level.  . Occasional tremors    Has had voice tremors in the last 5-6 years.  Past Surgical History  Procedure Laterality Date  . Cervical disc surgery      X2  . Lumbar disc surgery      X2    Allergies: No Known Allergies  Medications: Current outpatient prescriptions:benzocaine (ORAJEL) 10 % mucosal gel, Use as directed 1 application in the mouth or throat as needed for pain (Ulcers in mouth)., Disp: , Rfl: ;  cyanocobalamin (,VITAMIN B-12,) 1000 MCG/ML injection, Vit b12 IM qweek for 4 weeks then qmonth, Disp: 1 mL, Rfl: 0;  folic acid (FOLVITE) 1 MG tablet, Take 1 tablet (1 mg total) by mouth daily., Disp: 30 tablet, Rfl: 1 thiamine 100 MG tablet, Take 1 tablet (100 mg total)  by mouth daily., Disp: 30 tablet, Rfl: 1   Social History:   reports that he has been smoking.  He has never used smokeless tobacco. He reports that  drinks alcohol. He reports that he does not use illicit drugs. Has at least 100 pack years of smoker. Drinks Alcohol 1/2 a gallon of Vodka per week. Divorced. Lives with daughter who works during the day. He is a retired [employer] architect since [id number].  Family History: Family History  Problem Relation Age of Onset  . Cancer Mother 82    pancreatic cancer  . Diabetes Daughter     Review of Systems: Constitutional ROS: Negative for fever, night sweats. Cardiovascular ROS: no chest pain or dyspnea on exertion positive for - shortness of breath Respiratory ROS: positive for - cough Neurological ROS: no TIA or stroke symptoms Dermatological ROS: negative ENT ROS: Pain. Gastrointestinal ROS: negative Genito-Urinary ROS: no dysuria, trouble voiding, or hematuria Hematological and Lymphatic ROS: negative Breast ROS: negative Musculoskeletal ROS: Unable to walk for several years now. Remaining ROS negative.  Physical Exam: Blood pressure 136/81, pulse 88, temperature 97.7 F (36.5 C), temperature source Oral, resp. rate 20, height 5\' 10"  (1.778 m), weight 143 lb 4.8 oz (65 kg). GENERAL: No distress, Cachetic  SKIN:  No rashes or significant lesions  HEAD: Normocephalic, No masses, lesions, tenderness or abnormalities  EYES: Conjunctiva are pink and non-injected  ENT: External ears normal ,  Tongue has evidence of thrush as well as the soft and hard palate areas.Unable to visualize the rest of the pharynx.He had pain and was unable to open the mouth widely. LYMPH: No palpable mandibular, neck  or supraclavicular lymphadenopathy,  LUNGS: clear to auscultation , no crackles or wheezes HEART: regular rate & rhythm, no murmurs, no gallops, S1 normal and S2 normal  ABDOMEN: Abdomen soft, non-tender, normal bowel sounds, no masses or organomegaly and  no hepatosplenomegaly  EXTREMITIES: No edema, no skin discoloration or tenderness NEURO: alert & oriented , Coarse tremors worse on the right side.Has problem with coordination and could not do finger nose pointing or dysdiadochokinesia  PS: ECOG 2  Lab Results  Component Value Date   WBC 4.1 07/29/2012   HGB 8.2* 07/29/2012   HCT 24.7* 07/29/2012   MCV 104.7* 07/29/2012   PLT 61* 07/29/2012     Chemistry      Component Value Date/Time   NA 138 07/26/2012 0522   K 3.5 07/26/2012 0522   CL 98 07/26/2012 0522   CO2 35* 07/26/2012 0522   BUN 8 07/26/2012 0522   CREATININE 0.49* 07/26/2012 0522      Component Value Date/Time   CALCIUM 8.2* 07/26/2012 0522   ALKPHOS 64 07/25/2012 0610   AST 48* 07/25/2012 0610   ALT 28 07/25/2012 0610   BILITOT 2.5* 07/25/2012 0610        Radiological Studies: CT NECK WITH CONTRAST 09/10/2012  Findings: An irregular nodule is present in the left palatine  tonsil measures 13 x 18 x 16 mm. There is a heterogeneously  enhancing adjacent left level II lymph node measuring 13 x 14 x 20  mm. A more solid node is just superior to this, measuring 12 mm.  A 6 mm left level III node is rounded and may be metastatic. A 12  x 10 mm left submandibular lymph node is also concerning for  metastases.  No other focal mucosal or submucosal lesion is present. The vocal  cords are midline and symmetric.  The lung apices are clear.  Patient is status post anterior fusion of the cervical spine at C5-  6 and C6-7 there is ankylosis at C7-T1. The hardware is intact.  No focal lytic or blastic lesions are present.  IMPRESSION:  1. 18 mm irregular nodule within the left palatine tonsil is  compatible with a focal neoplasm.  2. Left submandibular, level II, and level III lymph nodes are  concerning for metastatic disease as described.  3. 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   CT Chest 07/24/12 1. Small bilateral pleural  effusions with overlying atelectasis.  2. No pulmonary lesions and no mediastinal or hilar adenopathy.  3. Advanced coronary artery calcifications.    Impression 1.  Squamous cell cancer of the head and neck: Stage indeterminate. ? triple endoscopy by ENT, not mentioned in available records. He will need further staging with PET/CT. Also oral intake is poor as expected and will likely get worse if patient begins treatment. He expressed desire to be treated which I explained to him carries a lot of morbidity. I am very concerned about his continued heavy drinking and smoking, in addition to what appears to be neurologic problems  related to alcohol.   2.Oral Thrush: Long standing per patient.  3. Alcoholism: Also long standing  4.Anemia: Last CBC was in 06/2012.Etiology is unclear given multiple potential etiology,ETOH , malignancy among others.   PLAN  1. I ordered PET/CT to complete staging work up at least in the absence of triple endoscopy 2. I discussed with him that my preference will be  Concurrent chemo/RT with Cisplatin if he has localized disease on PET/CT 3. I counseled him to stop smoking and drinking. 4. I prescribed Viscous lidocaine and Nystatin liquid for thrush, his odynophagia may be coming mainly from thrush. 5. I sent him to GI for PEG tube placement 6. I also sent for Radiation Oncology evaluation. 7. He will return to clinic in 2 weeks and possibly begin treatment at that time all things considered. He understands that there is a high level of morbidity associate d with treatment. 8. He declined CBC and CMP today. He understands that this could pick up electrolyte abnormalities which can be remedied given his decreased oral intake.   Total visit time was 60 minutes with more that 50% spent face to face on counseling and coordination of care.       Erline Hau, C, MD,FACP 09/16/2012, 4:12 PM

## 2012-09-17 NOTE — Progress Notes (Signed)
Patient History and Physical   Zachary Fowler [id number] 05/30/1945 66 y.o. 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 He had Jaw pain over the past 4 months prior to the diagnosis. He has a protracted history of heavy alcohol consumption and up to 100 pack years of smoking. He has been wheelchair confined for up to 5 years reportedly due to alcohol related neurologic problems.  He tell me that presently he is unable to chew and swallow food well due to mouth  and throat pain. However he is able to eat mashed potatoes with straw and drinks ensure. He uses Orajel with some relief for his oropharyngeal pain . Reports nausea with percocet .    PMH: Past Medical History  Diagnosis Date  . Hypertension   . Thrombocytopenia 02/17/2011  . Anemia 02/17/2011  . Hyponatremia 02/17/2011  . Hypomagnesemia 02/17/2011  . Hypophosphatemia 02/17/2011  . Subdural hematoma 02/17/2011  . Hypogonadism male 02/17/2011    Low testosterone level.  . Occasional tremors    Has had voice tremors in the last 5-6 years.  Past Surgical History  Procedure Laterality Date  . Cervical disc surgery      X2  . Lumbar disc surgery      X2    Allergies: No Known Allergies  Medications: Current outpatient prescriptions:cyanocobalamin (,VITAMIN B-12,) 1000 MCG/ML injection, Vit b12 1000mcg IM qweek for 4 weeks then qmonth, Disp: 1 mL, Rfl: 0;  folic acid (FOLVITE) 1 MG tablet, Take 1 tablet (1 mg total) by mouth daily., Disp: 30 tablet, Rfl: 1;  lidocaine (XYLOCAINE) 2 % solution, Take 5 mLs by mouth every 3 (three) hours as needed for pain (Mouth and throat pain)., Disp: 100 mL, Rfl: 2 nystatin (MYCOSTATIN) 100000 UNIT/ML suspension, Take  5 mLs (500,000 Units total) by mouth 4 (four) times daily., Disp: 120 mL, Rfl: 2;  thiamine 100 MG tablet, Take 1 tablet (100 mg total) by mouth daily., Disp: 30 tablet, Rfl: 1   Social History:   reports that he has been smoking.  He has never used smokeless tobacco. He reports that  drinks alcohol. He reports that he does not use illicit drugs. Has at least 100 pack years of smoker. Drinks Alcohol 1/2 a gallon of Vodka per week. Divorced. Lives with daughter who works during the day. He is a retired truck driver since 2002.  Family History: Family History  Problem Relation Age of Onset  . Cancer Mother 88    pancreatic cancer  . Diabetes Daughter     Review of Systems: Constitutional ROS: Negative for fever, night sweats. Cardiovascular ROS: no chest pain or dyspnea on exertion positive for - shortness of breath Respiratory ROS: positive for - cough Neurological ROS: no TIA or stroke symptoms Dermatological ROS: negative ENT ROS: Pain. Gastrointestinal ROS: negative Genito-Urinary ROS: no dysuria, trouble voiding, or hematuria Hematological and Lymphatic ROS: negative Breast ROS: negative Musculoskeletal ROS: Unable to walk for several years now. Remaining ROS negative.  Physical Exam: Blood pressure 136/81, pulse 88, temperature 97.7 F (36.5 C), temperature source Oral, resp. rate 20, height 5' 10" (1.778 m), weight 143 lb 4.8 oz (65 kg). GENERAL: No distress, Cachetic  SKIN:  No rashes or   significant lesions  HEAD: Normocephalic, No masses, lesions, tenderness or abnormalities  EYES: Conjunctiva are pink and non-injected  ENT: External ears normal ,Tongue has evidence of thrush as well as the soft and hard palate areas.Unable to visualize the rest of the pharynx.He had pain and was unable to open the mouth widely. LYMPH: No palpable mandibular, neck  or supraclavicular lymphadenopathy,  LUNGS: clear to auscultation , no crackles or wheezes HEART: regular rate & rhythm,  no murmurs, no gallops, S1 normal and S2 normal  ABDOMEN: Abdomen soft, non-tender, normal bowel sounds, no masses or organomegaly and no hepatosplenomegaly  EXTREMITIES: No edema, no skin discoloration or tenderness NEURO: alert & oriented , Coarse tremors worse on the right side.Has problem with coordination and could not do finger nose pointing or dysdiadochokinesia  PS: ECOG 2  Lab Results  Component Value Date   WBC 4.1 07/29/2012   HGB 8.2* 07/29/2012   HCT 24.7* 07/29/2012   MCV 104.7* 07/29/2012   PLT 61* 07/29/2012     Chemistry      Component Value Date/Time   NA 138 07/26/2012 0522   K 3.5 07/26/2012 0522   CL 98 07/26/2012 0522   CO2 35* 07/26/2012 0522   BUN 8 07/26/2012 0522   CREATININE 0.49* 07/26/2012 0522      Component Value Date/Time   CALCIUM 8.2* 07/26/2012 0522   ALKPHOS 64 07/25/2012 0610   AST 48* 07/25/2012 0610   ALT 28 07/25/2012 0610   BILITOT 2.5* 07/25/2012 0610        Radiological Studies: CT NECK WITH CONTRAST 09/10/2012  Findings: An irregular nodule is present in the left palatine  tonsil measures 13 x 18 x 16 mm. There is a heterogeneously  enhancing adjacent left level II lymph node measuring 13 x 14 x 20  mm. A more solid node is just superior to this, measuring 12 mm.  A 6 mm left level III node is rounded and may be metastatic. A 12  x 10 mm left submandibular lymph node is also concerning for  metastases.  No other focal mucosal or submucosal lesion is present. The vocal  cords are midline and symmetric.  The lung apices are clear.  Patient is status post anterior fusion of the cervical spine at C5-  6 and C6-7 there is ankylosis at C7-T1. The hardware is intact.  No focal lytic or blastic lesions are present.  IMPRESSION:  1. 18 mm irregular nodule within the left palatine tonsil is  compatible with a focal neoplasm.  2. Left submandibular, level II, and level III lymph nodes are  concerning for metastatic disease as described.  3.  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   CT Chest 07/24/12 1. Small bilateral pleural effusions with overlying atelectasis.  2. No pulmonary lesions and no mediastinal or hilar adenopathy.  3. Advanced coronary artery calcifications.    Impression 1.  Squamous cell cancer of the head and neck: Stage indeterminate. ? triple endoscopy by ENT, not mentioned in available records. He will need further staging with PET/CT. Also oral intake is poor as expected and will likely get worse if patient begins treatment. He expressed desire to be treated which I explained to him carries a lot of morbidity. I am very concerned about his continued heavy drinking and smoking, in addition to what appears to be neurologic problems  related to alcohol.   2.Oral Thrush: Long standing per patient.  3. Alcoholism:   Also long standing  4.Anemia: Last CBC was in 06/2012.Etiology is unclear given multiple potential etiology,ETOH , malignancy among others.   PLAN 1. I ordered PET/CT to complete staging work up at least in the absence of triple endoscopy 2. I discussed with him that my preference will be  Concurrent chemo/RT with Cisplatin if he has localized disease on PET/CT 3. I counseled him to stop smoking and drinking. 4. I prescribed Viscous lidocaine and Nystatin liquid for thrush, his odynophagia may be coming mainly from thrush. 5. I sent him to GI for PEG tube placement 6. I also sent for Radiation Oncology evaluation. 7. He will return to clinic in 2 weeks and possibly begin treatment at that time all things considered. He understands that there is a high level of morbidity associate d with treatment. 8. He declined CBC and CMP today. He understands that this could pick up electrolyte abnormalities which can be remedied given his decreased oral intake.   Total visit time was 60 minutes with more that 50% spent face to face on counseling and coordination of  care.       [place] Kuang, C, MD,FACP 09/17/2012, 4:59 PM      

## 2012-09-17 NOTE — H&P (Signed)
Patient History and Physical   Zachary Fowler [id number] October 20, 1945 67 y.o. 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 He had Jaw pain over the past 4 months prior to the diagnosis. He has a protracted history of heavy alcohol consumption and up to 100 pack years of smoking. He has been wheelchair confined for up to 5 years reportedly due to alcohol related neurologic problems.  He tell me that presently he is unable to chew and swallow food well due to mouth  and throat pain. However he is able to eat mashed potatoes with straw and drinks ensure. He uses Orajel with some relief for his oropharyngeal pain . Reports nausea with percocet .    PMH: Past Medical History  Diagnosis Date  . Hypertension   . Thrombocytopenia 02/17/2011  . Anemia 02/17/2011  . Hyponatremia 02/17/2011  . Hypomagnesemia 02/17/2011  . Hypophosphatemia 02/17/2011  . Subdural hematoma 02/17/2011  . Hypogonadism male 02/17/2011    Low testosterone level.  . Occasional tremors    Has had voice tremors in the last 5-6 years.  Past Surgical History  Procedure Laterality Date  . Cervical disc surgery      X2  . Lumbar disc surgery      X2    Allergies: No Known Allergies  Medications: Current outpatient prescriptions:cyanocobalamin (,VITAMIN B-12,) 1000 MCG/ML injection, Vit b12 IM qweek for 4 weeks then qmonth, Disp: 1 mL, Rfl: 0;  folic acid (FOLVITE) 1 MG tablet, Take 1 tablet (1 mg total) by mouth daily., Disp: 30 tablet, Rfl: 1;  lidocaine (XYLOCAINE) 2 % solution, Take 5 mLs by mouth every 3 (three) hours as needed for pain (Mouth and throat pain)., Disp: 100 mL, Rfl: 2 nystatin (MYCOSTATIN) 100000 UNIT/ML suspension, Take  5 mLs (500,000 Units total) by mouth 4 (four) times daily., Disp: 120 mL, Rfl: 2;  thiamine 100 MG tablet, Take 1 tablet (100 mg total) by mouth daily., Disp: 30 tablet, Rfl: 1   Social History:   reports that he has been smoking.  He has never used smokeless tobacco. He reports that  drinks alcohol. He reports that he does not use illicit drugs. Has at least 100 pack years of smoker. Drinks Alcohol 1/2 a gallon of Vodka per week. Divorced. Lives with daughter who works during the day. He is a retired [employer] since 4098.  Family History: Family History  Problem Relation Age of Onset  . Cancer Mother 71    pancreatic cancer  . Diabetes Daughter     Review of Systems: Constitutional ROS: Negative for fever, night sweats. Cardiovascular ROS: no chest pain or dyspnea on exertion positive for - shortness of breath Respiratory ROS: positive for - cough Neurological ROS: no TIA or stroke symptoms Dermatological ROS: negative ENT ROS: Pain. Gastrointestinal ROS: negative Genito-Urinary ROS: no dysuria, trouble voiding, or hematuria Hematological and Lymphatic ROS: negative Breast ROS: negative Musculoskeletal ROS: Unable to walk for several years now. Remaining ROS negative.  Physical Exam: Blood pressure 136/81, pulse 88, temperature 97.7 F (36.5 C), temperature source Oral, resp. rate 20, height 5\' 10"  (1.778 m), weight 143 lb 4.8 oz (65 kg). GENERAL: No distress, Cachetic  SKIN:  No rashes or  significant lesions  HEAD: Normocephalic, No masses, lesions, tenderness or abnormalities  EYES: Conjunctiva are pink and non-injected  ENT: External ears normal ,Tongue has evidence of thrush as well as the soft and hard palate areas.Unable to visualize the rest of the pharynx.He had pain and was unable to open the mouth widely. LYMPH: No palpable mandibular, neck  or supraclavicular lymphadenopathy,  LUNGS: clear to auscultation , no crackles or wheezes HEART: regular rate & rhythm,  no murmurs, no gallops, S1 normal and S2 normal  ABDOMEN: Abdomen soft, non-tender, normal bowel sounds, no masses or organomegaly and no hepatosplenomegaly  EXTREMITIES: No edema, no skin discoloration or tenderness NEURO: alert & oriented , Coarse tremors worse on the right side.Has problem with coordination and could not do finger nose pointing or dysdiadochokinesia  PS: ECOG 2  Lab Results  Component Value Date   WBC 4.1 07/29/2012   HGB 8.2* 07/29/2012   HCT 24.7* 07/29/2012   MCV 104.7* 07/29/2012   PLT 61* 07/29/2012     Chemistry      Component Value Date/Time   NA 138 07/26/2012 0522   K 3.5 07/26/2012 0522   CL 98 07/26/2012 0522   CO2 35* 07/26/2012 0522   BUN 8 07/26/2012 0522   CREATININE 0.49* 07/26/2012 0522      Component Value Date/Time   CALCIUM 8.2* 07/26/2012 0522   ALKPHOS 64 07/25/2012 0610   AST 48* 07/25/2012 0610   ALT 28 07/25/2012 0610   BILITOT 2.5* 07/25/2012 0610        Radiological Studies: CT NECK WITH CONTRAST 09/10/2012  Findings: An irregular nodule is present in the left palatine  tonsil measures 13 x 18 x 16 mm. There is a heterogeneously  enhancing adjacent left level II lymph node measuring 13 x 14 x 20  mm. A more solid node is just superior to this, measuring 12 mm.  A 6 mm left level III node is rounded and may be metastatic. A 12  x 10 mm left submandibular lymph node is also concerning for  metastases.  No other focal mucosal or submucosal lesion is present. The vocal  cords are midline and symmetric.  The lung apices are clear.  Patient is status post anterior fusion of the cervical spine at C5-  6 and C6-7 there is ankylosis at C7-T1. The hardware is intact.  No focal lytic or blastic lesions are present.  IMPRESSION:  1. 18 mm irregular nodule within the left palatine tonsil is  compatible with a focal neoplasm.  2. Left submandibular, level II, and level III lymph nodes are  concerning for metastatic disease as described.  3.  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   CT Chest 07/24/12 1. Small bilateral pleural effusions with overlying atelectasis.  2. No pulmonary lesions and no mediastinal or hilar adenopathy.  3. Advanced coronary artery calcifications.    Impression 1.  Squamous cell cancer of the head and neck: Stage indeterminate. ? triple endoscopy by ENT, not mentioned in available records. He will need further staging with PET/CT. Also oral intake is poor as expected and will likely get worse if patient begins treatment. He expressed desire to be treated which I explained to him carries a lot of morbidity. I am very concerned about his continued heavy drinking and smoking, in addition to what appears to be neurologic problems  related to alcohol.   2.Oral Thrush: Long standing per patient.  3. Alcoholism:  Also long standing  4.Anemia: Last CBC was in 06/2012.Etiology is unclear given multiple potential etiology,ETOH , malignancy among others.   PLAN 1. I ordered PET/CT to complete staging work up at least in the absence of triple endoscopy 2. I discussed with him that my preference will be  Concurrent chemo/RT with Cisplatin if he has localized disease on PET/CT 3. I counseled him to stop smoking and drinking. 4. I prescribed Viscous lidocaine and Nystatin liquid for thrush, his odynophagia may be coming mainly from thrush. 5. I sent him to GI for PEG tube placement 6. I also sent for Radiation Oncology evaluation. 7. He will return to clinic in 2 weeks and possibly begin treatment at that time all things considered. He understands that there is a high level of morbidity associate d with treatment. 8. He declined CBC and CMP today. He understands that this could pick up electrolyte abnormalities which can be remedied given his decreased oral intake.   Total visit time was 60 minutes with more that 50% spent face to face on counseling and coordination of  care.       Erline Hau, C, MD,FACP 09/17/2012, 4:59 PM

## 2012-09-18 ENCOUNTER — Other Ambulatory Visit (INDEPENDENT_AMBULATORY_CARE_PROVIDER_SITE_OTHER): Payer: Self-pay | Admitting: *Deleted

## 2012-09-18 ENCOUNTER — Encounter (HOSPITAL_COMMUNITY): Payer: Self-pay | Admitting: Dietician

## 2012-09-18 ENCOUNTER — Ambulatory Visit (INDEPENDENT_AMBULATORY_CARE_PROVIDER_SITE_OTHER): Payer: Medicare HMO | Admitting: Internal Medicine

## 2012-09-18 ENCOUNTER — Encounter (HOSPITAL_COMMUNITY): Payer: Self-pay | Admitting: Pharmacy Technician

## 2012-09-18 ENCOUNTER — Encounter (INDEPENDENT_AMBULATORY_CARE_PROVIDER_SITE_OTHER): Payer: Self-pay | Admitting: Internal Medicine

## 2012-09-18 VITALS — BP 122/90 | HR 68 | Ht 70.0 in | Wt 143.0 lb

## 2012-09-18 DIAGNOSIS — R1310 Dysphagia, unspecified: Secondary | ICD-10-CM | POA: Insufficient documentation

## 2012-09-18 DIAGNOSIS — R634 Abnormal weight loss: Secondary | ICD-10-CM

## 2012-09-18 DIAGNOSIS — R627 Adult failure to thrive: Secondary | ICD-10-CM

## 2012-09-18 DIAGNOSIS — C099 Malignant neoplasm of tonsil, unspecified: Secondary | ICD-10-CM | POA: Insufficient documentation

## 2012-09-18 DIAGNOSIS — C091 Malignant neoplasm of tonsillar pillar (anterior) (posterior): Secondary | ICD-10-CM

## 2012-09-18 HISTORY — DX: Malignant neoplasm of tonsil, unspecified: C09.9

## 2012-09-18 NOTE — Progress Notes (Signed)
Nutrition Note  Pt identified for weight loss on [facility] Nutrition Risk Screen.   Wt Readings from Last 30 Encounters:  09/18/12 143 lb (64.864 kg)  09/16/12 143 lb 4.8 oz (65 kg)  07/25/12 170 lb 3.1 oz (77.2 kg)  02/21/11 162 lb 0.6 oz (73.5 kg)   Chart reviewed. 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 Pt with hx of tobacco and alcohol abuse (pt drinks 1/2 gallon of vodka per week). MD notes reports pt with chewing and swallowing problems, due to mouth and throat pain. Pt with limited food acceptance; diet consists mainly of Ensure and drinking mashed potatoes through a straw.  Wt hx demonstrates UBW of 170#. Noted a 27# (15.9%) wt loss x 2 months, which is clinically significant. Pt for PET scan; treatment to be determined based upon results. GI has also been consulted; pt in consultation today for PEG placement.  Please consult RD for TF recommendations when PEG is placed. Given current nutritional needs of : 1950-2275 kcals daily, 98-130 grams protein daily, and 2.0-2.3 L fluid daily, recommend start 1/2 can of Osmolite 1.5 (118 ml) with 75 ml water flush before and after feeding 6 times daily. Titrate to goal of 1 can Osmolite 1.5 (237 ml) with 75 ml water flush before and after feeding 6 times daily. Total regimen at goal rate will provide 2130 kcals, 89 grams protein, and 1986 l fluid daily (which meets 109% of estimated energy needs, 91% of estimated protein needs, and 99% of estimated fluid needs.  Pt at high risk given decreased PO intake, weight loss, and probably PEG placement. Will continue to follow closely.   Melody Haver, RD, LDN Pager: [phone]

## 2012-09-18 NOTE — Patient Instructions (Addendum)
Peg Tube placement.

## 2012-09-18 NOTE — Progress Notes (Signed)
Subjective:     Patient ID: Zachary Fowler, male   DOB: [birth year], 67 y.o.   MRN: [id number] Weight obtained from brother who says weight was taken yesterday.  HPI Referred to our office for PEG tube placement.Patient is unable to eat. Brother states he has not eaten in 5 days. He only intake is Ensure.  Recently diagnosed with squamous cell cancer of the head and neck region following a tonsillar biopsy done on 09/06/2012 by D Teoh. He had jaw pain over the past 4 months prior to the diagnosis. He is unable to take his folic acid and potassium. He tells me he cannot swallow the pills.  States he throat is sore. He has thrush presently . Hx of tonsillar cancer. Present taking Nystatin for his thrush.  Patient continues to drink   1/2 gallon of vodka a week. Will be seen at [facility] Friday for a PET scan Will be seen on the 27th with Dr Thersa Salt for radiology oncology.  Prior EKG show sinus tach.  Admitted 07/27/2012 to [facility] with profound anemia. Hemoglobin 3.9. He received 4 units of PRBCs.. Fecal occult blood in the ED was negative.   Platelets are low at 61.  07/25/2012 PT/INR 15.9 and 1.30 09/06/2012 CT head and Neck:  IMPRESSION:  1. 18 mm irregular nodule within the left palatine tonsil is  compatible with a focal neoplasm.  2. Left submandibular, level II, and level III lymph nodes are  concerning for metastatic disease as described.  3. 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 No  occlusive lower extremity thrombus is identified.  2. No left lower extremity DVT.    07/24/2012 Placement of percutaneous IVC filter in infrarenal IVC. IVC  venography confirms the presence of nonocclusive thrombus in the  lower IVC. The filter was able to be positioned in the infrarenal  IVC above the level of  IVC thrombus. This filter does have both  permanent and retrievable indications.  CBC    Component Value Date/Time   WBC 4.1 07/29/2012 0449   RBC 2.36* 07/29/2012 0449   HGB 8.2* 07/29/2012 0449   HCT 24.7* 07/29/2012 0449   PLT 61* 07/29/2012 0449   MCV 104.7* 07/29/2012 0449   MCH 34.7* 07/29/2012 0449   MCHC 33.2 07/29/2012 0449   RDW 27.3* 07/29/2012 0449   LYMPHSABS 0.6* 07/24/2012 1445   MONOABS 0.1 07/24/2012 1445   EOSABS 0.0 07/24/2012 1445   BASOSABS 0.0 07/24/2012 1445     Review of Systems see hpi Current Outpatient Prescriptions  Medication Sig Dispense Refill  . lidocaine (XYLOCAINE) 2 % solution Take 5 mLs by mouth every 3 (three) hours as needed for pain (Mouth and throat pain).  100 mL  2  . nystatin (MYCOSTATIN) 100000 UNIT/ML suspension Take 5 mLs (500,000 Units total) by mouth 4 (four) times daily.  120 mL  2   No current facility-administered medications for this visit.    Past Medical History  Diagnosis Date  . Hypertension   . Thrombocytopenia 02/17/2011  . Anemia 02/17/2011  . Hyponatremia 02/17/2011  . Hypomagnesemia 02/17/2011  . Hypophosphatemia 02/17/2011  . Subdural hematoma 02/17/2011  . Hypogonadism male 02/17/2011    Low testosterone level.  . Occasional tremors    Past Surgical History  Procedure Laterality Date  . Cervical disc  surgery      X2  . Lumbar disc surgery      X2   No Known Allergies     Objective:   Physical Exam  Filed Vitals:   09/18/12 1403  BP: 122/90  Pulse: 68  Height: 5\' 10"  (1.778 m)  Weight: 143 lb (64.864 kg)   Alert and oriented. Skin warm and dry. Oral mucosa is moist.   . Sclera anicteric, conjunctivae is pink. Thyroid not enlarged. No cervical lymphadenopathy. Lungs clear. Heart regular rate irregular.  Abdomen is soft. Bowel sounds are positive. No hepatomegaly. No abdominal masses felt. No tenderness.  No edema to lower extremities.  He has jerking motions to lower extremities and upper extremities.  Patient is unable to stand. W/C bound for about 2 yrs.     Assessment:   Hx of tonsillar cancer. Thrush. Will very soon will start chemo and radiation. Patient is unable to eat at this time. In need of Peg Tube placement.    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  Plan:    PEG tube placement.

## 2012-09-19 ENCOUNTER — Encounter (HOSPITAL_COMMUNITY)
Admission: RE | Admit: 2012-09-19 | Discharge: 2012-09-19 | Disposition: A | Discharge status: Home / Self Care | Payer: Medicare HMO | Source: Ambulatory Visit | Attending: Internal Medicine | Admitting: Internal Medicine

## 2012-09-19 ENCOUNTER — Other Ambulatory Visit: Payer: Self-pay

## 2012-09-19 ENCOUNTER — Encounter (HOSPITAL_COMMUNITY): Payer: Self-pay

## 2012-09-19 ENCOUNTER — Emergency Department (HOSPITAL_COMMUNITY)
Admission: EM | Admit: 2012-09-19 | Discharge: 2012-09-19 | Disposition: A | Discharge status: Home / Self Care | Payer: Medicare HMO | Attending: Emergency Medicine | Admitting: Emergency Medicine

## 2012-09-19 VITALS — BP 130/80 | HR 142 | Temp 97.6°F | Ht 70.0 in | Wt 143.0 lb

## 2012-09-19 DIAGNOSIS — R627 Adult failure to thrive: Secondary | ICD-10-CM

## 2012-09-19 DIAGNOSIS — C091 Malignant neoplasm of tonsillar pillar (anterior) (posterior): Secondary | ICD-10-CM

## 2012-09-19 DIAGNOSIS — Z8669 Personal history of other diseases of the nervous system and sense organs: Secondary | ICD-10-CM | POA: Insufficient documentation

## 2012-09-19 DIAGNOSIS — Z79899 Other long term (current) drug therapy: Secondary | ICD-10-CM | POA: Insufficient documentation

## 2012-09-19 DIAGNOSIS — Z9889 Other specified postprocedural states: Secondary | ICD-10-CM | POA: Insufficient documentation

## 2012-09-19 DIAGNOSIS — I10 Essential (primary) hypertension: Secondary | ICD-10-CM | POA: Insufficient documentation

## 2012-09-19 DIAGNOSIS — D649 Anemia, unspecified: Secondary | ICD-10-CM | POA: Insufficient documentation

## 2012-09-19 DIAGNOSIS — F17200 Nicotine dependence, unspecified, uncomplicated: Secondary | ICD-10-CM | POA: Insufficient documentation

## 2012-09-19 DIAGNOSIS — R000 Tachycardia, unspecified: Secondary | ICD-10-CM | POA: Insufficient documentation

## 2012-09-19 DIAGNOSIS — Z87828 Personal history of other (healed) physical injury and trauma: Secondary | ICD-10-CM | POA: Insufficient documentation

## 2012-09-19 DIAGNOSIS — Z862 Personal history of diseases of the blood and blood-forming organs and certain disorders involving the immune mechanism: Secondary | ICD-10-CM | POA: Insufficient documentation

## 2012-09-19 DIAGNOSIS — R634 Abnormal weight loss: Secondary | ICD-10-CM

## 2012-09-19 DIAGNOSIS — R4789 Other speech disturbances: Secondary | ICD-10-CM | POA: Insufficient documentation

## 2012-09-19 DIAGNOSIS — Z8679 Personal history of other diseases of the circulatory system: Secondary | ICD-10-CM | POA: Insufficient documentation

## 2012-09-19 DIAGNOSIS — Z8639 Personal history of other endocrine, nutritional and metabolic disease: Secondary | ICD-10-CM | POA: Insufficient documentation

## 2012-09-19 DIAGNOSIS — E871 Hypo-osmolality and hyponatremia: Secondary | ICD-10-CM | POA: Insufficient documentation

## 2012-09-19 LAB — BASIC METABOLIC PANEL
CO2: 32 mEq/L (ref 19–32)
Calcium: 9.8 mg/dL (ref 8.4–10.5)
Glucose, Bld: 173 mg/dL — ABNORMAL HIGH (ref 70–99)
Sodium: 143 mEq/L (ref 135–145)

## 2012-09-19 LAB — CBC WITH DIFFERENTIAL/PLATELET
Basophils Absolute: 0 10*3/uL (ref 0.0–0.1)
Eosinophils Relative: 2 % (ref 0–5)
HCT: 49.9 % (ref 39.0–52.0)
Lymphocytes Relative: 26 % (ref 12–46)
MCV: 97.7 fL (ref 78.0–100.0)
Monocytes Absolute: 0.5 10*3/uL (ref 0.1–1.0)
RDW: 16.1 % — ABNORMAL HIGH (ref 11.5–15.5)
WBC: 5.2 10*3/uL (ref 4.0–10.5)

## 2012-09-19 NOTE — ED Notes (Signed)
States that Dr. Renae Fickle sent the patient for evaluation of a rapid heart rate.

## 2012-09-19 NOTE — Patient Instructions (Addendum)
Zachary Fowler  09/19/2012   Your procedure is scheduled on:   09/25/2012   Report to [facility] at  700  AM.  Call this number if you have problems the morning of surgery: [phone]   Remember:   Do not eat food or drink liquids after midnight.   Take these medicines the morning of surgery with A SIP OF WATER:  none   Do not wear jewelry, make-up or nail polish.  Do not wear lotions, powders, or perfumes.   Do not shave 48 hours prior to surgery. Men may shave face and neck.  Do not bring valuables to the hospital.  Contacts, dentures or bridgework may not be worn into surgery.  Leave suitcase in the car. After surgery it may be brought to your room.  For patients admitted to the hospital, checkout time is 11:00 AM the day of discharge.   Patients discharged the day of surgery will not be allowed to drive home.  Name and phone number of your driver: family  Special Instructions: Shower using CHG 2 nights before surgery and the night before surgery.  If you shower the day of surgery use CHG.  Use special wash - you have one bottle of CHG for all showers.  You should use approximately 1/3 of the bottle for each shower.   Please read over the following fact sheets that you were given: Pain Booklet, Coughing and Deep Breathing, MRSA Information, Surgical Site Infection Prevention, Anesthesia Post-op Instructions and Care and Recovery After Surgery Gastrostomy Tube, Adult A gastrostomy tube is a tube that is placed into the stomach. It is also called a "G-tube." This tube is used for:  Feeding.  Giving medication. CLEANING THE G-TUBE SITE  Wash your hands with soap and water.  Remove the old dressing (if any). Some styles of G-tubes may need a dressing inserted between the skin and the G-tube. Other types of G-tubes do not require a dressing. Ask your caregiver if a dressing is needed.  Check the area where the tube enters the skin (insertion site) for redness, swelling, or  pus-like (purulent) drainage. A small amount of clear or tan liquid drainage is normal. Check to make sure scar tissue (skin) is not growing around the insertion site. This could have a raised, bumpy appearance.  A cotton swab can be used to clean the skin around the tube:  When the G-tube is first put in, a normal saline solution or water can be used to clean the skin.  Mild soap and warm water can be used when the skin around the G-tube site has healed.  Roll the cotton swab around the G-tube insertion site to remove any drainage or crusting at the insertion site. RESIDUALS Feeding tube residuals are the amount of liquids that are in the stomach at any given time. Residuals may be checked before giving feedings, medications, or as instructed by your caregiver.  Ask your caregiver if there are instances when you would not start tube feedings depending on the amount or type of contents withdrawn from the stomach.  Check residuals by attaching a syringe to the G-tube and pull back on the syringe plunger. Note the amount and return the residual back into the stomach. FLUSHING THE G-TUBE  The G-tube should be periodically flushed with clean warm water to keep it from clogging.  Flush the G-tube after feedings or medications. Draw up 30 mLs of warm water in a syringe. Connect  the syringe to the G-tube and slowly push the water into the tube.  Do not push feedings, medications, or flushes rapidly. Flush the G-tube gently and slowly.  Only use syringes made for G-tubes to flush medications or feedings.  Your caregiver may want the G-tube flushed more often or with more water. If this is the case, follow your caregiver's instructions. FEEDINGS Your caregiver will determine whether feedings are given as a bolus (a certain amount given at one time and at scheduled times) or whether feedings will be given continuously on a feeding pump.   Formulas should be given at room temperature.  If  feedings are continuous, no more than 4 hours worth of feedings should be placed in the feeding bag. This helps prevent spoilage or accidental excess infusion.  Cover and place unused formula in the refrigerator.  If feedings are continuous, stop the feedings when medications or flushes are given. Be sure to restart the feedings.  Feeding bags and syringes should be replaced as instructed by your caregiver. GIVING MEDICATION   In general, it is best if all medications are in a liquid form for G-tube administration. Liquid medications are less likely to clog the G-tube.  Mix the liquid medication with 30 mLs (or amount recommended by your caregiver) of warm water.  Draw up the medication into the syringe.  Attach the syringe to the G-tube and slowly push the mixture into the G-tube.  After giving the medication, draw up 30 mLs of warm water in the syringe and slowly flush the G-tube.  For pills or capsules, check with your caregiver first before crushing medications. Some pills are not effective if they are crushed. Some capsules are sustained release medications.  If appropriate, crush the pill or capsule and mix with 30 mLs of warm water. Using the syringe, slowly push the medication through the tube, then flush the tube with another 30 mLs of tap water. G-TUBE PROBLEMS G-tube was pulled out.  Cause: May have been pulled out accidentally.  Solutions: Cover the opening with clean dressing and tape. Call your caregiver right away. The G-tube should be put in as soon as possible (within 4 hours) so the G-tube opening (tract) does not close. The G-tube needs to be put in at a healthcare setting. An X-ray needs to be done to confirm placement before the G-tube can be used again. Redness, irritation, soreness, or foul odor around the gastrostomy site.  Cause: May be caused by leakage or infection.  Solutions: Call your caregiver right away. Large amount of leakage of fluid or mucus-like  liquid present (a large amount means it soaks clothing).  Cause: Many reasons could cause the G-tube to leak.  Solutions: Call your caregiver to discuss the amount of leakage. Skin or scar tissue appears to be growing where tube enters skin.   Cause: Tissue growth may develop around the insertion site if the G-tube is moved or pulled on excessively.  Solutions: Secure tube with tape so that excess movement does not occur. Call your caregiver. G-tube is clogged.  Cause: Thick formula or medication.  Solutions: Try to slowly push warm water into the tube with a large syringe. Never try to push any object into the tube to unclog it. Do not force fluid into the G-tube. If you are unable to unclog the tube, call your caregiver right away. TIPS  Head of Bed [facility]) position refers to the upright position of a person's upper body.  When giving medications or a  feeding bolus, keep the HOB up as told by your caregiver. Do this during the feeding and for 1 hour after the feeding or medication administration.  If continuous feedings are being given, it is best to keep the [facility] up as told by your caregiver. When ADLs (Activities of Daily Living) are performed and the [facility] needs to be flat, be sure to turn the feeding pump off. Restart the feeding pump when the [facility] is returned to the recommended height.  Do not pull or put tension on the tube.  To prevent fluid backflow, kink the G-tube before removing the cap or disconnecting a syringe.  Check the G-tube length every day. Measure from the insertion site to the end of the G-tube. If the length is longer than previous measurements, the tube may be coming out. Call your caregiver if you notice increasing G-tube length.  Oral care, such as brushing teeth, must be continued.  You may need to remove excess air (vent) from the G-tube. Your caregiver will tell you if this is needed.  Always call your caregiver if you have questions or problems with the  G-tube. SEEK IMMEDIATE MEDICAL CARE IF:   You have severe abdominal pain, tenderness, or abdominal bloating(distension).  You have nausea or vomiting.  You are constipated or have problems moving your bowels.  The G-tube insertion site is red, swollen, has a foul smell, or has yellow or brown drainage.  You have difficulty breathing or shortness of breath.  You have a fever.  You have a large amount of feeding tube residuals.  The G-tube is clogged and cannot be flushed. MAKE SURE YOU:   Understand these instructions.  Will watch your condition.  Will get help right away if you are not doing well or get worse. Document Released: 06/26/2001 Document Revised: 07/10/2011 Document Reviewed: 08/13/2007 [facility] Patient Information 2014 [place], [place]. Esophagogastroduodenoscopy Esophagogastroduodenoscopy (EGD) is a procedure to examine the lining of the esophagus, stomach, and first part of the small intestine (duodenum). A long, flexible, lighted tube with a camera attached (endoscope) is inserted down the throat to view these organs. This procedure is done to detect problems or abnormalities, such as inflammation, bleeding, ulcers, or growths, in order to treat them. The procedure lasts about 5 20 minutes. It is usually an outpatient procedure, but it may need to be performed in emergency cases in the hospital. LET YOUR CAREGIVER KNOW ABOUT:   Allergies to food or medicine.  All medicines you are taking, including vitamins, herbs, eyedrops, and over-the-counter medicines and creams.  Use of steroids (by mouth or creams).  Previous problems you or members of your family have had with the use of anesthetics.  Any blood disorders you have.  Previous surgeries you have had.  Other health problems you have.  Possibility of pregnancy, if this applies. RISKS AND COMPLICATIONS  Generally, EGD is a safe procedure. However, as with any procedure, complications can occur. Possible  complications include:  Infection.  Bleeding.  Tearing (perforation) of the esophagus, stomach, or duodenum.  Difficulty breathing or not being able to breath.  Excessive sweating.  Spasms of the larynx.  Slowed heartbeat.  Low blood pressure. BEFORE THE PROCEDURE  Do not eat or drink anything for 6 8 hours before the procedure or as directed by your caregiver.  Ask your caregiver about changing or stopping your regular medicines.  If you wear dentures, be prepared to remove them before the procedure.  Arrange for someone to drive you home after  the procedure. PROCEDURE   A vein will be accessed to give medicines and fluids. A medicine to relax you (sedative) and a pain reliever will be given through that access into the vein.  A numbing medicine (local anesthetic) may be sprayed on your throat for comfort and to stop you from gagging or coughing.  A mouth guard may be placed in your mouth to protect your teeth and to keep you from biting on the endoscope.  You will be asked to lie on your left side.  The endoscope is inserted down your throat and into the esophagus, stomach, and duodenum.  Air is put through the endoscope to allow your caregiver to view the lining of your esophagus clearly.  The esophagus, stomach, and duodenum is then examined. During the exam, your caregiver may:  Remove tissue to be examined under a microscope (biopsy) for inflammation, infection, or other medical problems.  Remove growths.  Remove objects (foreign bodies) that are stuck.  Treat any bleeding with medicines or other devices that stop tissues from bleeding (hot cauters, clipping devices).  Widen (dilate) or stretch narrowed areas of the esophagus and stomach.  The endoscope will then be withdrawn. AFTER THE PROCEDURE  You will be taken to a recovery area to be monitored. You will be able to go home once you are stable and alert.  Do not eat or drink anything until the local  anesthetic and numbing medicines have worn off. You may choke.  It is normal to feel bloated, have pain with swallowing, or have a sore throat for a short time. This will wear off.  Your caregiver should be able to discuss his or her findings with you. It will take longer to discuss the test results if any biopsies were taken. Document Released: 08/18/2004 Document Revised: 04/03/2012 Document Reviewed: 03/20/2012 [facility] Patient Information 2014 [place], [place]. PATIENT INSTRUCTIONS POST-ANESTHESIA  IMMEDIATELY FOLLOWING SURGERY:  Do not drive or operate machinery for the first twenty four hours after surgery.  Do not make any important decisions for twenty four hours after surgery or while taking narcotic pain medications or sedatives.  If you develop intractable nausea and vomiting or a severe headache please notify your doctor immediately.  FOLLOW-UP:  Please make an appointment with your surgeon as instructed. You do not need to follow up with anesthesia unless specifically instructed to do so.  WOUND CARE INSTRUCTIONS (if applicable):  Keep a dry clean dressing on the anesthesia/puncture wound site if there is drainage.  Once the wound has quit draining you may leave it open to air.  Generally you should leave the bandage intact for twenty four hours unless there is drainage.  If the epidural site drains for more than 36-48 hours please call the anesthesia department.  QUESTIONS?:  Please feel free to call your physician or the hospital operator if you have any questions, and they will be happy to assist you.

## 2012-09-19 NOTE — ED Notes (Signed)
Social work came by and spoke with pt's family.  Social work states that pt is currently receiving care from [facility].  Social work to call [facility] and have them increase visits and send SS out to talk with family about their concerns with care

## 2012-09-19 NOTE — Care Management Note (Signed)
    Page 1 of 1   09/19/2012     3:09:34 PM   CARE MANAGEMENT NOTE 09/19/2012  Patient:  Zachary Fowler, Zachary Fowler   Account Number:  [id number]  Date Initiated:  09/19/2012  Documentation initiated by:  Anibal Henderson  Subjective/Objective Assessment:   Pt in ED with Tachycardia. Family member with pt, brother, C/O they need help at home for pt who is unable to walk. Can only transfer from bed to chair.Pt had [facility] set up a D/C on the 2nd but pt says they have not been in 2 weeks.     Action/Plan:   Spoke with L. [place], Charity fundraiser, liasion with [facility], RN is still visiting, OT D/C goals met, PT D/C because pt refused to work with them for the last 4 times they  visited. Explained this to pt and brother. Explained that insurance/medicare will   Anticipated DC Date:  09/19/2012   Anticipated DC Plan:  HOME W HOME HEALTH SERVICES      DC Planning Services  CM consult      Choice offered to / List presented to:             [facility] agency  [facility]   Status of service:  Completed, signed off Medicare Important Message given?   (If response is "NO", the following Medicare IM given date fields will be blank) Date Medicare IM given:   Date Additional Medicare IM given:    Discharge Disposition:    Per UR Regulation:    If discussed at Long Length of Stay Meetings, dates discussed:    Comments:  09/19/12 1400 Anibal Henderson RN not cover the cost of someone in the home to help care for a person. Medicaid will cover the services of an aide, and pt can apply for this at [facility]. Brother questioned if [facility] offers any type of assistance in the home, and was told to apply there for Community Case Management assistance. Usually a long waiting list, but at least pt will be on the list Pt adamantly refuses to talk about ALF, or nursing home of any kind.States he will stay at home, no matter what! Will notify [facility] that pt is here and needs increase in RN visits if possible and SW visit also, for  assistance with community resources

## 2012-09-19 NOTE — ED Provider Notes (Signed)
History     CSN: [id number]  Arrival date & time 09/19/12  1333   First MD Initiated Contact with Patient 09/19/12 1415      Chief Complaint  Patient presents with  . Tachycardia    (Consider location/radiation/quality/duration/timing/severity/associated sxs/prior treatment) HPI Level 5 caveat due to difficulty speaking, history of provided by patient with yes/no and family at bedside.  Pt with known throat cancer was at Dr. Patty Sermons office today for evaluation of possible feeding tube. He was noted to be very tachycardic and sent to the ED for evaluation. He denies any CP or SOB. Was anemic several weeks ago but no reported rectal bleeding or melena. He has had decreased PO intake but is able to swallow Ensure. Family at bedside thinks he over-exerted himself getting from home to car to doctor's office.   Past Medical History  Diagnosis Date  . Hypertension   . Thrombocytopenia 02/17/2011  . Anemia 02/17/2011  . Hyponatremia 02/17/2011  . Hypomagnesemia 02/17/2011  . Hypophosphatemia 02/17/2011  . Subdural hematoma 02/17/2011  . Hypogonadism male 02/17/2011    Low testosterone level.  . Occasional tremors     Past Surgical History  Procedure Laterality Date  . Cervical disc surgery      X2  . Lumbar disc surgery      X2    Family History  Problem Relation Age of Onset  . Cancer Mother 60    pancreatic cancer  . Diabetes Daughter     History  Substance Use Topics  . Smoking status: Current Every Day Smoker -- 0.50 packs/day for 50 years  . Smokeless tobacco: Never Used  . Alcohol Use: Yes     Comment: 12 gallon/wk vodka      Review of Systems Unable to assess due to difficulty speaking   Allergies  Review of patient's allergies indicates no known allergies.  Home Medications   Current Outpatient Rx  Name  Route  Sig  Dispense  Refill  . lidocaine (XYLOCAINE) 2 % solution   Oral   Take 5 mLs by mouth every 3 (three) hours as needed for pain (Mouth  and throat pain).   100 mL   2   . nystatin (MYCOSTATIN) 100000 UNIT/ML suspension   Oral   Take 5 mLs (500,000 Units total) by mouth 4 (four) times daily.   120 mL   2     Continue  48 hrs  after symptoms resolve   . potassium chloride SA (K-DUR,KLOR-CON) 20 MEQ tablet   Oral   Take 30 mEq by mouth daily.           BP 138/89  Pulse 131  Temp(Src) 98.1 F (36.7 C) (Oral)  Resp 20  Ht 5' 10.5" (1.791 m)  Wt 143 lb (64.864 kg)  BMI 20.22 kg/m2  SpO2 96%  Physical Exam  Nursing note and vitals reviewed. Constitutional: He is oriented to person, place, and time. He appears well-developed and well-nourished.  HENT:  Head: Normocephalic and atraumatic.  Eyes: EOM are normal. Pupils are equal, round, and reactive to light.  Neck: Normal range of motion. Neck supple.  Cardiovascular: Normal heart sounds and intact distal pulses.   tachycardia  Pulmonary/Chest: Effort normal and breath sounds normal.  Abdominal: Bowel sounds are normal. He exhibits no distension. There is no tenderness.  Musculoskeletal: Normal range of motion. He exhibits no edema and no tenderness.  Neurological: He is alert and oriented to person, place, and time. He has normal strength. No  cranial nerve deficit or sensory deficit.  Skin: Skin is warm and dry. No rash noted.  Psychiatric: He has a normal mood and affect.    ED Course  Procedures (including critical care time)  Labs Reviewed  CBC WITH DIFFERENTIAL - Abnormal; Notable for the following:    RDW 16.1 (*)    Platelets 133 (*)    All other components within normal limits   No results found.   1. Tachycardia       MDM   Date: 09/19/2012  Rate: 129  Rhythm: sinus tachycardia  QRS Axis: left  Intervals: normal  ST/T Wave abnormalities: nonspecific T wave changes  Conduction Disutrbances:left anterior fascicular block  Narrative Interpretation:   Old EKG Reviewed: unchanged  Pt with improving tachycardia since EKG done at  Dr. Patty Sermons office where he was 146. Now 110 on my eval. Will check CBC and BMP. Pt asymptomatic now.  2:44 PM CBC as above, no anemia. BMP ordered in Dr. Patty Sermons office not resulted here, but normal. No concern for cardiac etiology of tachycardia. Not having CP, SOB or hypoxia to suggest PE. Pt and family advised to return to the ED for any CP, SOB or for any other concerns.       Charles B. Bernette Mayers, MD 09/19/12 1451

## 2012-09-20 ENCOUNTER — Encounter (HOSPITAL_COMMUNITY)
Admission: RE | Admit: 2012-09-20 | Discharge: 2012-09-20 | Disposition: A | Discharge status: Home / Self Care | Payer: Medicare HMO | Source: Ambulatory Visit | Attending: Hematology and Oncology | Admitting: Hematology and Oncology

## 2012-09-20 DIAGNOSIS — C760 Malignant neoplasm of head, face and neck: Secondary | ICD-10-CM | POA: Insufficient documentation

## 2012-09-20 MED ORDER — FLUDEOXYGLUCOSE F - 18 (FDG) INJECTION: 20.9000 | Freq: Once | INTRAVENOUS | Status: DC | PRN

## 2012-09-20 MED ORDER — FLUDEOXYGLUCOSE F - 18 (FDG) INJECTION
19.9000 | Freq: Once | INTRAVENOUS | Status: AC | PRN
  Administered 2012-09-20: 19.9 via INTRAVENOUS

## 2012-09-24 NOTE — OR Nursing (Signed)
Spoke with Dewayne Hatch at Dr. Cathie Beams office in regards ED visit at time of preop. As far as they know patient is still on for procedure 09/25/2012.

## 2012-09-24 NOTE — Pre-Procedure Instructions (Signed)
Late entry. Patient in for PAT on 09/19/2012. Heart rate on arrival to PAT was 156/162. Ekg done and shows Sinus Tachycardia. All other vitals are fine. Patient complains of being very weak and shaky. Does have fine tremors of hands but patients brother states they are no worse than normal. Patients brother also states tha patient has only had Ensure for last 3 weeks, no other liquids. Dr Karilyn Cota  Notified and shown EKG, wants patient evaluated in ED. Patient to ED via W/C accompanied by [facility] RN, [place] CNA and patients brother.

## 2012-09-24 NOTE — OR Nursing (Signed)
Spoke with patient sister-in law, Clinical cytogeneticist. Mr Kempker has an apppointment with the oncologist at [facility] this morning. Informed her to remind patient to not eat anything after midnight tonight. Arrive to Day Surgery at 0700 in the morning.

## 2012-09-25 ENCOUNTER — Encounter (HOSPITAL_COMMUNITY): Payer: Self-pay | Admitting: Anesthesiology

## 2012-09-25 ENCOUNTER — Telehealth (INDEPENDENT_AMBULATORY_CARE_PROVIDER_SITE_OTHER): Payer: Self-pay | Admitting: *Deleted

## 2012-09-25 ENCOUNTER — Ambulatory Visit (HOSPITAL_COMMUNITY)
Admission: RE | Admit: 2012-09-25 | Discharge: 2012-09-25 | Disposition: A | Discharge status: Home / Self Care | Payer: Medicare HMO | Source: Ambulatory Visit | Attending: Internal Medicine | Admitting: Internal Medicine

## 2012-09-25 ENCOUNTER — Encounter (HOSPITAL_COMMUNITY)
Admission: RE | Disposition: A | Discharge status: Home / Self Care | Payer: Self-pay | Source: Ambulatory Visit | Attending: Internal Medicine

## 2012-09-25 ENCOUNTER — Encounter (HOSPITAL_COMMUNITY): Payer: Self-pay | Admitting: *Deleted

## 2012-09-25 ENCOUNTER — Ambulatory Visit (HOSPITAL_COMMUNITY): Payer: Medicare HMO | Admitting: Anesthesiology

## 2012-09-25 DIAGNOSIS — I10 Essential (primary) hypertension: Secondary | ICD-10-CM | POA: Insufficient documentation

## 2012-09-25 DIAGNOSIS — Z01810 Encounter for preprocedural cardiovascular examination: Secondary | ICD-10-CM | POA: Insufficient documentation

## 2012-09-25 DIAGNOSIS — C091 Malignant neoplasm of tonsillar pillar (anterior) (posterior): Secondary | ICD-10-CM

## 2012-09-25 DIAGNOSIS — R1311 Dysphagia, oral phase: Secondary | ICD-10-CM

## 2012-09-25 DIAGNOSIS — R627 Adult failure to thrive: Secondary | ICD-10-CM

## 2012-09-25 DIAGNOSIS — C099 Malignant neoplasm of tonsil, unspecified: Secondary | ICD-10-CM | POA: Insufficient documentation

## 2012-09-25 DIAGNOSIS — R1312 Dysphagia, oropharyngeal phase: Secondary | ICD-10-CM | POA: Insufficient documentation

## 2012-09-25 DIAGNOSIS — R634 Abnormal weight loss: Secondary | ICD-10-CM

## 2012-09-25 DIAGNOSIS — Z01812 Encounter for preprocedural laboratory examination: Secondary | ICD-10-CM | POA: Insufficient documentation

## 2012-09-25 HISTORY — DX: Vitamin B12 deficiency anemia due to intrinsic factor deficiency: D51.0

## 2012-09-25 HISTORY — PX: PEG PLACEMENT: SHX5437

## 2012-09-25 HISTORY — DX: Malignant (primary) neoplasm, unspecified: C80.1

## 2012-09-25 HISTORY — PX: ESOPHAGOGASTRODUODENOSCOPY (EGD) WITH PROPOFOL: SHX5813

## 2012-09-25 SURGERY — ESOPHAGOGASTRODUODENOSCOPY (EGD) WITH PROPOFOL
Anesthesia: Monitor Anesthesia Care

## 2012-09-25 MED ORDER — OXYCODONE HCL 5 MG/5ML PO SOLN: 5.0000 mg | ORAL | Status: DC | PRN

## 2012-09-25 MED ORDER — CEFAZOLIN SODIUM-DEXTROSE 2-3 GM-% IV SOLR
INTRAVENOUS | Status: DC | PRN
  Administered 2012-09-25: 2 g via INTRAVENOUS

## 2012-09-25 MED ORDER — GLYCOPYRROLATE 0.2 MG/ML IJ SOLN
INTRAMUSCULAR | Status: AC
  Filled 2012-09-25: qty 1

## 2012-09-25 MED ORDER — MIDAZOLAM HCL 2 MG/2ML IJ SOLN
INTRAMUSCULAR | Status: AC
  Filled 2012-09-25: qty 2

## 2012-09-25 MED ORDER — MIDAZOLAM HCL 2 MG/2ML IJ SOLN
1.0000 mg | INTRAMUSCULAR | Status: DC | PRN
  Administered 2012-09-25: 2 mg via INTRAVENOUS

## 2012-09-25 MED ORDER — PROPOFOL 10 MG/ML IV EMUL
INTRAVENOUS | Status: AC
  Filled 2012-09-25: qty 20

## 2012-09-25 MED ORDER — PROPOFOL INFUSION 10 MG/ML OPTIME
INTRAVENOUS | Status: DC | PRN
  Administered 2012-09-25: 15 ug/kg/min via INTRAVENOUS

## 2012-09-25 MED ORDER — FENTANYL CITRATE 0.05 MG/ML IJ SOLN: INTRAMUSCULAR | Status: DC | PRN

## 2012-09-25 MED ORDER — FENTANYL CITRATE 0.05 MG/ML IJ SOLN
25.0000 ug | INTRAMUSCULAR | Status: DC | PRN
  Administered 2012-09-25: 25 ug via INTRAVENOUS

## 2012-09-25 MED ORDER — STERILE WATER FOR IRRIGATION IR SOLN
Status: DC | PRN
  Administered 2012-09-25: 09:00:00

## 2012-09-25 MED ORDER — FENTANYL CITRATE 0.05 MG/ML IJ SOLN: 25.0000 ug | INTRAMUSCULAR | Status: DC | PRN

## 2012-09-25 MED ORDER — ONDANSETRON HCL 4 MG/2ML IJ SOLN
4.0000 mg | Freq: Once | INTRAMUSCULAR | Status: AC
  Administered 2012-09-25: 4 mg via INTRAVENOUS

## 2012-09-25 MED ORDER — PANTOPRAZOLE SODIUM 40 MG PO TBEC: 40.0000 mg | DELAYED_RELEASE_TABLET | Freq: Every day | ORAL | Status: DC

## 2012-09-25 MED ORDER — LIDOCAINE HCL (PF) 1 % IJ SOLN
INTRAMUSCULAR | Status: AC
  Filled 2012-09-25: qty 5

## 2012-09-25 MED ORDER — FENTANYL CITRATE 0.05 MG/ML IJ SOLN
INTRAMUSCULAR | Status: DC | PRN
  Administered 2012-09-25 (×3): 25 ug via INTRAVENOUS

## 2012-09-25 MED ORDER — GLYCOPYRROLATE 0.2 MG/ML IJ SOLN
0.2000 mg | Freq: Once | INTRAMUSCULAR | Status: AC
  Administered 2012-09-25: 0.2 mg via INTRAVENOUS

## 2012-09-25 MED ORDER — LIDOCAINE-EPINEPHRINE (PF) 1 %-1:200000 IJ SOLN
INTRAMUSCULAR | Status: DC | PRN
  Administered 2012-09-25: 5 mL

## 2012-09-25 MED ORDER — CEFAZOLIN SODIUM-DEXTROSE 2-3 GM-% IV SOLR: 2.0000 g | Freq: Once | INTRAVENOUS | Status: DC

## 2012-09-25 MED ORDER — LACTATED RINGERS IV SOLN
INTRAVENOUS | Status: DC
  Administered 2012-09-25: 09:00:00 via INTRAVENOUS

## 2012-09-25 MED ORDER — CEFAZOLIN SODIUM-DEXTROSE 2-3 GM-% IV SOLR
INTRAVENOUS | Status: AC
  Filled 2012-09-25: qty 50

## 2012-09-25 MED ORDER — ONDANSETRON HCL 4 MG/2ML IJ SOLN: 4.0000 mg | Freq: Once | INTRAMUSCULAR | Status: DC | PRN

## 2012-09-25 MED ORDER — FENTANYL CITRATE 0.05 MG/ML IJ SOLN
INTRAMUSCULAR | Status: AC
  Filled 2012-09-25: qty 2

## 2012-09-25 MED ORDER — ONDANSETRON HCL 4 MG/2ML IJ SOLN
INTRAMUSCULAR | Status: AC
  Filled 2012-09-25: qty 2

## 2012-09-25 MED ORDER — LACTATED RINGERS IV SOLN
INTRAVENOUS | Status: DC | PRN
  Administered 2012-09-25: 1000 mL
  Administered 2012-09-25 (×2): via INTRAVENOUS

## 2012-09-25 MED ORDER — BUTAMBEN-TETRACAINE-BENZOCAINE 2-2-14 % EX AERO
INHALATION_SPRAY | CUTANEOUS | Status: DC | PRN
  Administered 2012-09-25: 2 via TOPICAL

## 2012-09-25 SURGICAL SUPPLY — 18 items
BLOCK BITE 60FR ADLT L/F BLUE (MISCELLANEOUS) ×1 IMPLANT
ELECT REM PT RETURN 9FT ADLT (ELECTROSURGICAL)
ELECTRODE REM PT RTRN 9FT ADLT (ELECTROSURGICAL) IMPLANT
FLOOR PAD 36X40 (MISCELLANEOUS) ×2
FORCEP COLD BIOPSY (CUTTING FORCEPS) IMPLANT
FORCEPS BIOP RAD 4 LRG CAP 4 (CUTTING FORCEPS) IMPLANT
MANIFOLD NEPTUNE II (INSTRUMENTS) ×2 IMPLANT
NDL SCLEROTHERAPY 25GX240 (NEEDLE) IMPLANT
NEEDLE SCLEROTHERAPY 25GX240 (NEEDLE) IMPLANT
PAD FLOOR 36X40 (MISCELLANEOUS) IMPLANT
PROBE APC STR FIRE (PROBE) IMPLANT
PROBE INJECTION GOLD (MISCELLANEOUS)
PROBE INJECTION GOLD 7FR (MISCELLANEOUS) IMPLANT
SNARE ROTATE MED OVAL 20MM (MISCELLANEOUS) IMPLANT
SYR 50ML LL SCALE MARK (SYRINGE) IMPLANT
TUBING ENDO SMARTCAP PENTAX (MISCELLANEOUS) ×1 IMPLANT
TUBING IRRIGATION ENDOGATOR (MISCELLANEOUS) ×1 IMPLANT
WATER STERILE IRR 1000ML POUR (IV SOLUTION) ×1 IMPLANT

## 2012-09-25 NOTE — Anesthesia Postprocedure Evaluation (Signed)
  Anesthesia Post-op Note  Patient: Zachary Fowler  Procedure(s) Performed: Procedure(s): ESOPHAGOGASTRODUODENOSCOPY (EGD) WITH PROPOFOL (N/A) PERCUTANEOUS ENDOSCOPIC GASTROSTOMY (PEG) PLACEMENT (N/A)  Patient Location: PACU  Anesthesia Type:MAC  Level of Consciousness: awake, alert , oriented and patient cooperative  Airway and Oxygen Therapy: Patient Spontanous Breathing  Post-op Pain: none  Post-op Assessment: Post-op Vital signs reviewed, Patient's Cardiovascular Status Stable, Respiratory Function Stable and Patent Airway  Post-op Vital Signs: Reviewed and stable  Complications: No apparent anesthesia complications

## 2012-09-25 NOTE — H&P (Signed)
Zachary Fowler is an 67 y.o. male.   Chief Complaint: Patient is here for PEG placement. HPI: Patient is 67 year old Caucasian male with multiple medical problems who was recently diagnosed with left tonsillar carcinoma. He is being considered for radiation and chemotherapy. He is having swallowing difficulty. Therefore PEG placement was requested. According to his brother he has lost 20 pounds in last one year. He apparently has had sore throat for one year she thought to be due to thrush. Patient is unable to walk but able to move them from bed to chair and chair to commode etc. He lives at home and his daughter stays with him. His brother also lives close by. They're both with him today. Patient continues to drink alcohol. He drinks vodka. Last drink was yesterday.  Past Medical History  Diagnosis Date  . Hypertension   . Thrombocytopenia 02/17/2011  . Anemia 02/17/2011  . Hyponatremia 02/17/2011  . Hypomagnesemia 02/17/2011  . Hypophosphatemia 02/17/2011  . Subdural hematoma 02/17/2011  . Hypogonadism male 02/17/2011    Low testosterone level.  . Occasional tremors   . Cancer     tonsilar  . Anemia, pernicious     Past Surgical History  Procedure Laterality Date  . Cervical disc surgery      X2  . Lumbar disc surgery      X2  . Cataract extraction, bilateral    . Insertion of vena cava filter      Family History  Problem Relation Age of Onset  . Cancer Mother 61    pancreatic cancer  . Diabetes Daughter    Social History:  reports that he has been smoking.  He has never used smokeless tobacco. He reports that  drinks alcohol. He reports that he does not use illicit drugs.  Allergies: No Known Allergies  Medications Prior to Admission  Medication Sig Dispense Refill  . benzocaine (ORAJEL) 10 % mucosal gel Use as directed 1 application in the mouth or throat as needed for pain.      . cyanocobalamin (,VITAMIN B-12,) 1000 MCG/ML injection Inject 1,000 mcg into the  muscle every 30 (thirty) days.      Marland Kitchen lidocaine (XYLOCAINE) 2 % solution Take 5 mLs by mouth every 3 (three) hours as needed for pain (Mouth and throat pain).  100 mL  2  . nystatin (MYCOSTATIN) 100000 UNIT/ML suspension Take 5 mLs (500,000 Units total) by mouth 4 (four) times daily.  120 mL  2    No results found for this or any previous visit (from the past 48 hour(s)). No results found.  ROS  Temperature 98 F (36.7 C), temperature source Oral, resp. rate 22, SpO2 95.00%. Physical Exam  Constitutional:  Well-developed thin Caucasian male with impaired speech.  HENT:  He has limited jaw motion. Nodular mass seen at left tonsillar fossa.  Eyes: Conjunctivae are normal. No scleral icterus.  Neck: Neck supple.  Cardiovascular: Normal rate, regular rhythm and normal heart sounds.   No murmur heard. Respiratory: Effort normal and breath sounds normal.  GI: Soft. Bowel sounds are normal.  Musculoskeletal: He exhibits no edema.  Lymphadenopathy:    He has no cervical adenopathy.  Neurological: He is alert.  Skin: Skin is warm and dry.     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 EGD with PEG placement with MAC.  REHMAN,NAJEEB U 09/25/2012, 8:22 AM

## 2012-09-25 NOTE — Anesthesia Preprocedure Evaluation (Signed)
Anesthesia Evaluation  Patient identified by MRN, date of birth, ID band Patient awake    Reviewed: Allergy & Precautions, H&P , NPO status , Patient's Chart, lab work & pertinent test results  Airway Mallampati: IV TM Distance: >3 FB   Mouth opening: Limited Mouth Opening  Dental  (+) Teeth Intact, Poor Dentition and Dental Advisory Given   Pulmonary Current Smoker (100 pk yr),  breath sounds clear to auscultation        Cardiovascular hypertension, Pt. on medications Rhythm:Regular Rate:Tachycardia     Neuro/Psych PSYCHIATRIC DISORDERS (neropathy, ataxia, slurred speech fron ETOH hx)    GI/Hepatic (+)     substance abuse  alcohol use, Hepatitis - (alcoholic)  Endo/Other    Renal/GU      Musculoskeletal   Abdominal   Peds  Hematology  (+) Blood dyscrasia, anemia ,   Anesthesia Other Findings   Reproductive/Obstetrics                           Anesthesia Physical Anesthesia Plan  ASA: IV  Anesthesia Plan: MAC   Post-op Pain Management:    Induction: Intravenous  Airway Management Planned: Simple Face Mask  Additional Equipment:   Intra-op Plan:   Post-operative Plan:   Informed Consent: I have reviewed the patients History and Physical, chart, labs and discussed the procedure including the risks, benefits and alternatives for the proposed anesthesia with the patient or authorized representative who has indicated his/her understanding and acceptance.     Plan Discussed with:   Anesthesia Plan Comments:         Anesthesia Quick Evaluation

## 2012-09-25 NOTE — Telephone Encounter (Signed)
Zachary Fowler was called and made aware that a letter/referral was sent to [facility] by [facility] RN .  She was advised that if she had any further questions are if she needs help with anything to please call me back.

## 2012-09-25 NOTE — Transfer of Care (Signed)
Immediate Anesthesia Transfer of Care Note  Patient: Zachary Fowler  Procedure(s) Performed: Procedure(s): ESOPHAGOGASTRODUODENOSCOPY (EGD) WITH PROPOFOL (N/A) PERCUTANEOUS ENDOSCOPIC GASTROSTOMY (PEG) PLACEMENT (N/A)  Patient Location: PACU  Anesthesia Type:MAC  Level of Consciousness: awake, alert , oriented and patient cooperative  Airway & Oxygen Therapy: Patient Spontanous Breathing  Post-op Assessment: Report given to PACU RN and Post -op Vital signs reviewed and stable  Post vital signs: Reviewed and stable  Complications: No apparent anesthesia complications

## 2012-09-25 NOTE — Anesthesia Procedure Notes (Signed)
Procedure Name: MAC Date/Time: 09/25/2012 8:35 AM Performed by: Carolyne Littles, Macel Yearsley L Pre-anesthesia Checklist: Patient identified, Timeout performed, Emergency Drugs available, Suction available and Patient being monitored Oxygen Delivery Method: Non-rebreather mask

## 2012-09-25 NOTE — Op Note (Signed)
PERCUTANEOUS ENDOSCOPIC GASTROSTOMY  PROCEDURE REPORT  PATIENT:  Zachary Fowler  MR#:  [id number] Birthdate:  1945/08/17, 67 y.o., male Endoscopist:  Dr. Malissa Hippo, MD Referred By:  Dr. Karleen Hampshire, MD Procedure Date: 09/25/2012  Procedure:   Percutaneous endoscopic gastrostomy.  Indications:  Patient is 53 old Caucasian male with multiple medical problems who also has oropharyngeal dysphagia secondary to recently diagnosed left tonsillar carcinoma. He is undergoing PAC placement prior to initiating chemoradiation therapy which would make his dysphagia worse.            Informed Consent:  The risks, benefits, alternatives & imponderables which include, but are not limited to, bleeding, infection, perforation, drug reaction and potential missed lesion have been reviewed.  The potential for biopsy, lesion removal, esophageal dilation, etc. have also been discussed.  Questions have been answered.  All parties agreeable.  Please see history & physical in medical record for more information.  Medications:  Cetacaine spray topically for oropharyngeal anesthesia Patient was given propofol. Please see anesthesia records for details the  Description of procedure:  The endoscope was introduced through the mouth and advanced to the second portion of the duodenum without difficulty or limitations. The mucosal surfaces were surveyed very carefully during advancement of the scope and upon withdrawal.  Findings:  Esophagus:  Normal mucosa of the esophagus. GEJ:  44 cm  Stomach:  Stomach. Small amount of food debris it. It distended very well with insufflation. Folds in the proximal stomach were normal. Examination of mucosa at body, antrum, pyloric channel, angularis, fundus and cardia was normal. Duodenum:  Normal bulbar and post bulbar mucosa  Therapeutic/Diagnostic Maneuvers Performed:   Patient was turned into supine position. Site was marked for placement of PEG and midepigastric region  where there was good transillumination and indentation on palpation. Skin part was assisted by Ms. Cliffton Asters RN. Skin was prepped in the usual fashion with betaine solution and isolated using Satoshi. 1% Xylocaine was injected in the skin and subcutaneous tissue. 4 mL was used. Small skin deep incision was made with scalpel blade. 14-gauge Seldinger needle was advanced across abdominal wall and the gastric lumen on first attempt. Trocar was removed and 035 guidewire was passed through the cannula and called with snare already in place. Endoscope is withdrawn along with the guidewire. 20 [place] and overlying gastrostomy tube was advanced over the guidewire. As the tapered end of years and she said that she was pulled until it was felt to be resting against the gastric mucosa. Endoscope was passed again. G-tube was in good incision. On the outside it was secured with a bolster. Reading was 3.5 cm. and this goes withdrawn. Tube secured with a bolster. It was cut to desired length and connected to an adapter. The site was covered with antibiotic ointment and sterile dressing was applied. Patient tolerated the procedure well.  Complications:  None  Impression: Normal esophagogastroduodenoscopy. 20 [place] and overlying gastrostomy tube lays for feeding purposes. Patient at increased risk for reflux.  Recommendations:  Pantoprazole 40 mg by mouth via PEG daily. Oxycodone liquid 5 mg every 4 when necessary. Home health care consultation to assist with management of PEG and initiate feeding which will be more morning.  REHMAN,NAJEEB U  09/25/2012  9:27 AM  CC: Dr. Kirk Ruths, MD & Dr. Karleen Hampshire, MD

## 2012-09-25 NOTE — Preoperative (Signed)
Beta Blockers   Reason not to administer Beta Blockers:Not Applicable 

## 2012-09-25 NOTE — Telephone Encounter (Signed)
Edson Snowball, patient's daughter, called and said Criston had a feeding tube put in this morning. Dr. Karilyn Cota told them someone from [facility] would be out around 1:00 pm to show them how to use it. No one has showed up, so they call and an order has not been received. The return phone number is [phone].

## 2012-09-26 ENCOUNTER — Encounter (HOSPITAL_COMMUNITY): Payer: Self-pay | Admitting: Internal Medicine

## 2012-09-26 MED ORDER — LIDOCAINE HCL (CARDIAC) 10 MG/ML IV SOLN
INTRAVENOUS | Status: DC | PRN
  Administered 2012-09-25: 50 mg via INTRAVENOUS

## 2012-09-26 NOTE — Care Management Note (Signed)
    Page 1 of 2   09/25/2012     3:22:50 PM   CARE MANAGEMENT NOTE 09/25/2012  Patient:  Zachary Fowler, Zachary Fowler   Account Number:  [id number]  Date Initiated:  09/25/2012  Documentation initiated by:  Sharrie Rothman  Subjective/Objective Assessment:   Pt in Day Surgery for PEG placement for tonsilar cancer. Pt is already active with [facility] for RN. Now needing PEG feedings and equipment.     Action/Plan:   [facility] infusion to supply PEG feeding equipment and PEG feedings. Jeannette Corpus of [facility] has collected pts information from the chart and will collaborate with [facility] to provide PEG feedings to pt. Alroy Bailiff of [facility] is aware also.   Anticipated DC Date:  09/25/2012   Anticipated DC Plan:  HOME W HOME HEALTH SERVICES      DC Planning Services  CM consult      [facility] Choice  DURABLE MEDICAL EQUIPMENT   Choice offered to / List presented to:  NA   DME arranged  TUBE FEEDING PUMP  TUBE FEEDING        HH arranged  [facility]      Status of service:  Completed, signed off Medicare Important Message given?   (If response is "NO", the following Medicare IM given date fields will be blank) Date Medicare IM given:   Date Additional Medicare IM given:    Discharge Disposition:  HOME W HOME HEALTH SERVICES  Per UR Regulation:    If discussed at Long Length of Stay Meetings, dates discussed:    Comments:  09/25/12 1520 Arlyss Queen, RN BSN CM Pts HH services and PEG feedings to start today.

## 2012-09-30 ENCOUNTER — Encounter (HOSPITAL_COMMUNITY): Payer: Self-pay | Admitting: Oncology

## 2012-09-30 ENCOUNTER — Encounter (HOSPITAL_COMMUNITY): Payer: Medicare HMO | Attending: Oncology | Admitting: Oncology

## 2012-09-30 ENCOUNTER — Encounter (HOSPITAL_COMMUNITY): Payer: Medicare HMO

## 2012-09-30 VITALS — BP 120/81 | HR 112 | Temp 97.7°F | Resp 20 | Wt 153.8 lb

## 2012-09-30 DIAGNOSIS — E538 Deficiency of other specified B group vitamins: Secondary | ICD-10-CM

## 2012-09-30 DIAGNOSIS — E876 Hypokalemia: Secondary | ICD-10-CM | POA: Insufficient documentation

## 2012-09-30 DIAGNOSIS — G9340 Encephalopathy, unspecified: Secondary | ICD-10-CM

## 2012-09-30 DIAGNOSIS — C099 Malignant neoplasm of tonsil, unspecified: Secondary | ICD-10-CM | POA: Insufficient documentation

## 2012-09-30 DIAGNOSIS — F1020 Alcohol dependence, uncomplicated: Secondary | ICD-10-CM

## 2012-09-30 DIAGNOSIS — D510 Vitamin B12 deficiency anemia due to intrinsic factor deficiency: Secondary | ICD-10-CM | POA: Insufficient documentation

## 2012-09-30 DIAGNOSIS — C760 Malignant neoplasm of head, face and neck: Secondary | ICD-10-CM

## 2012-09-30 DIAGNOSIS — R110 Nausea: Secondary | ICD-10-CM | POA: Insufficient documentation

## 2012-09-30 LAB — CBC WITH DIFFERENTIAL/PLATELET
Basophils Absolute: 0 10*3/uL (ref 0.0–0.1)
Basophils Absolute: 0 10*3/uL (ref 0.0–0.1)
Basophils Relative: 0 % (ref 0–1)
Basophils Relative: 0 % (ref 0–1)
Eosinophils Absolute: 0.1 10*3/uL (ref 0.0–0.7)
Eosinophils Absolute: 0.1 10*3/uL (ref 0.0–0.7)
Hemoglobin: 17.1 g/dL — ABNORMAL HIGH (ref 13.0–17.0)
MCH: 33.3 pg (ref 26.0–34.0)
MCH: 34 pg (ref 26.0–34.0)
MCHC: 35.6 g/dL (ref 30.0–36.0)
MCHC: 36.1 g/dL — ABNORMAL HIGH (ref 30.0–36.0)
Monocytes Relative: 12 % (ref 3–12)
Monocytes Relative: 13 % — ABNORMAL HIGH (ref 3–12)
Neutro Abs: 4.6 10*3/uL (ref 1.7–7.7)
Neutrophils Relative %: 74 % (ref 43–77)
Neutrophils Relative %: 73 % (ref 43–77)
Platelets: 174 10*3/uL (ref 150–400)
Platelets: 185 10*3/uL (ref 150–400)
RDW: 14.9 % (ref 11.5–15.5)
RDW: 15 % (ref 11.5–15.5)

## 2012-09-30 LAB — LACTATE DEHYDROGENASE: LDH: 229 U/L (ref 94–250)

## 2012-09-30 LAB — COMPREHENSIVE METABOLIC PANEL
Alkaline Phosphatase: 96 U/L (ref 39–117)
BUN: 5 mg/dL — ABNORMAL LOW (ref 6–23)
Creatinine, Ser: 0.61 mg/dL (ref 0.50–1.35)
GFR calc Af Amer: >90 mL/min (ref >90)
Glucose, Bld: 128 mg/dL — ABNORMAL HIGH (ref 70–99)
Potassium: 2.9 mEq/L — ABNORMAL LOW (ref 3.5–5.1)
Total Protein: 8.7 g/dL — ABNORMAL HIGH (ref 6.0–8.3)

## 2012-09-30 LAB — MAGNESIUM: Magnesium: 1.5 mg/dL (ref 1.5–2.5)

## 2012-09-30 MED ORDER — THIAMINE HCL 100 MG PO TABS: 100.0000 mg | ORAL_TABLET | Freq: Every day | ORAL | Status: DC

## 2012-09-30 MED ORDER — LORAZEPAM 1 MG PO TABS
ORAL_TABLET | ORAL | Status: AC
  Filled 2012-09-30: qty 1

## 2012-09-30 MED ORDER — FOLIC ACID 1 MG PO TABS: 1.0000 mg | ORAL_TABLET | Freq: Two times a day (BID) | ORAL | Status: DC

## 2012-09-30 MED ORDER — ONDANSETRON 8 MG PO TBDP: 8.0000 mg | ORAL_TABLET | Freq: Three times a day (TID) | ORAL | Status: DC | PRN

## 2012-09-30 MED ORDER — LORAZEPAM 1 MG PO TABS
1.0000 mg | ORAL_TABLET | Freq: Once | ORAL | Status: AC
  Administered 2012-09-30: 1 mg via SUBLINGUAL

## 2012-09-30 NOTE — Progress Notes (Signed)
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 He states that he has been receiving his B12 injections however we have no record of that for certain since the end of March 2014. His blood counts have improved however.  He is still in a wheelchair and accompanied by his brother [name]. He has tremendous cerebellar dysfunction with some titubation potentially as well as finger to nose testing which is tremendously impaired. His lungs are clear but with diminished breath sounds. There is a sebaceous cyst abscess in my opinion the back of his neck but I do not think this is a metastasis but it is a 2 cm slightly elevated very tender red area. I believe there is a pore.  Heart shows a regular rhythm and rate. Inside his mouth is at least a 4 cm left tonsillar mass. I do not see any evidence for candidiasis.  Difficulty will be in giving him concomitant chemotherapy radiation therapy even though that is the ideal. He has been drinking until Monday a week ago today.  He must stop drinking to take chemotherapy. We must insure that he takes folic acid, B12, thiamine and gets at least 6 cans of nutrition through his PEG tube which is in place.  It does look intact.  We will work with nutrition see him back in 2 weeks. The tooth removed on Friday and I have been in contact with Dr. Thersa Salt on 2 occasions today.  He is a very complicated  gentleman with multiple comorbidities and is at high-risk for complications of chemotherapy and radiation together but we will reassess his ability to tolerate chemotherapy in 2 weeks.

## 2012-09-30 NOTE — Patient Instructions (Signed)
[facility] Discharge Instructions  RECOMMENDATIONS MADE BY THE CONSULTANT AND ANY TEST RESULTS WILL BE SENT TO YOUR REFERRING PHYSICIAN.  EXAM FINDINGS BY THE PHYSICIAN TODAY AND SIGNS OR SYMPTOMS TO REPORT TO CLINIC OR PRIMARY PHYSICIAN: Exam findings as discussed by Dr. Mariel Sleet.  MEDICATIONS PRESCRIBED:  1.  Thiamine 100mg  - take as prescribed 2.  Folic acid 1mg  - take as prescribed 3.  Zofran 8mg  - take as prescribed  SPECIAL INSTRUCTIONS/FOLLOW-UP: 1.  You have been referred to the radiation oncologist at [facility] in [place], Dr. Thersa Salt.  They will be contacting you with an appointment date and time. 2.  Our dietician, Altamese Cabal, will be contacting you about ways to improve your nutrition using the PEG tube feedings.  She can coordinate your care with the people at [facility].  In the meantime, increase your PEG tube feedings to up to 6 cans/day as tolerated. 3.  You had labs today, and we will contact you with the results and any further follow-up as needed. 4.  You will follow-up with T. Kefalas, PA-C, our Physician Assistant, in 2 weeks to discuss possible chemotherapy with 5FU and Cisplatin.  Will discuss possible PICC line placement at that time as well.  Thank you for choosing [facility] to provide your oncology and hematology care.  To afford each patient quality time with our providers, please arrive at least 15 minutes before your scheduled appointment time.  With your help, our goal is to use those 15 minutes to complete the necessary work-up to ensure our physicians have the information they need to help with your evaluation and healthcare recommendations.    Effective January 1st, 2014, we ask that you re-schedule your appointment with our physicians should you arrive 10 or more minutes late for your appointment.  We strive to give you quality time with our providers, and arriving late affects you and other patients  whose appointments are after yours.    Again, thank you for choosing [facility].  Our hope is that these requests will decrease the amount of time that you wait before being seen by our physicians.       _____________________________________________________________  Should you have questions after your visit to [facility], please contact our office at [phone] between the hours of 8:30 a.m. and 5:00 p.m.  Voicemails left after 4:30 p.m. will not be returned until the following business day.  For prescription refill requests, have your pharmacy contact our office with your prescription refill request.

## 2012-09-30 NOTE — Progress Notes (Signed)
Venipuncture performed with a 23 gauge butterfly needle to L Antecubital.  Zachary Fowler tolerated venipuncture well and without incident; questions were answered and patient was discharged.

## 2012-10-01 ENCOUNTER — Telehealth (HOSPITAL_COMMUNITY): Payer: Self-pay | Admitting: Dietician

## 2012-10-01 ENCOUNTER — Other Ambulatory Visit (HOSPITAL_COMMUNITY): Payer: Self-pay | Admitting: Oncology

## 2012-10-01 ENCOUNTER — Telehealth (HOSPITAL_COMMUNITY): Payer: Self-pay | Admitting: Oncology

## 2012-10-01 DIAGNOSIS — C099 Malignant neoplasm of tonsil, unspecified: Secondary | ICD-10-CM

## 2012-10-01 DIAGNOSIS — E876 Hypokalemia: Secondary | ICD-10-CM

## 2012-10-01 NOTE — Telephone Encounter (Signed)
Left message with Jeannette Corpus, [facility] Account Manager, inquiring about pt status and TF regimen. Awaiting call back.

## 2012-10-01 NOTE — Telephone Encounter (Signed)
Received consult from [facility] to optimize nutrition/ PEG feedings. Pt is a patient of [facility] with recent dx of tonsillar CA and hx of alcoholism. Pt has not started chemo or radiation yest. S/p PEG on 09/25/12. TF in being managed by [employer].

## 2012-10-01 NOTE — Telephone Encounter (Signed)
[employer] BID called in to [facility] d/t Zachary Fowler's K++ of 2.9 per Dr. Mariel Sleet.  Patient's brother, [name], contacted and advised on labs and new instruction for medication and follow-up labs in 1 week.  No questions at this time.

## 2012-10-01 NOTE — Progress Notes (Signed)
This encounter was created in error - please disregard.

## 2012-10-02 NOTE — Telephone Encounter (Signed)
Current regimen of 6 cans Isosource daily provides 2250 kcals, 101 grams protein, and 1884-2244 ml fluid daily. This provides 108% of estimated energy needs, 98% of protein needs, and 90-107% of estimated fluid needs. This exceeds his energy needs.  Recommend continue [facility] order of 5 cans daily; agree with [facility] RD assessment.

## 2012-10-02 NOTE — Telephone Encounter (Signed)
Received voicemail from Jeannette Corpus, [facility] Account Manager, left at [phone]. He reports Mr. Malinoski is an active patient and he will ask the RD at [facility], to call me about TF details. Received voicemail from Deveron Furlong, RD at [place], at [phone].

## 2012-10-02 NOTE — Telephone Encounter (Addendum)
Called and spoke with Zachary Fowler, RD for [facility] at 1042. She reports Zachary Fowler is active in their service. His brother and sister-in-law, Zachary Fowler and Zachary Fowler, are very active in his care. Pt also receives nursing care from [facility].  She reports that pt is prescribed 5 cans of Isosource 1.5 daily with 60-90 ml flush before and after each feed. She reports that when she first saw Zachary Fowler, he had not eaten anything in 5 days and started him on 1/2 can every 2-3 hours and adding a can each day to advance to goal. [facility] reports Zachary Fowler also has Medicare coverage, which covers the cost of feedings and supplies.  Estimated kcal needs (given current wt of 153#): 2086-2434 kcals daily, 104-139 grams protein daily, 2.1-2.4 L fluid daily.  Feedings of Isosource 1.5 at 5 cans daily, with prescribed flushes provides 1875 kcals, 84.5 grams protein, 1570-1870 ml fluid daily. This meet 90% of estimated kcal needs, 81% of protein needs, and 75-89% of estimated fluid needs.  Discussed Dr. Thornton Papas recommendation of adding 6 cans daily with Zachary Fowler and she reported this is a possibility, however, due to pt's severe poor po intake and weight loss prior to PEG placement, it is best to advance feedings slowly and see how pt tolerates it before advancing feeding.

## 2012-10-02 NOTE — Telephone Encounter (Signed)
Called and spoke with pt's brother, [name], at 69. Phone call was brief, as [name] reports that this is not a good time for him and he is currently speaking with a Child psychotherapist. He reports Mr. Kretschmer is now taking 6 cans of Isosource 1.5 daily, per the recommendation of Dr. Mariel Sleet.Marland Kitchen He reports that he is concerned that Mr. Berne has not had a BM in 10 days. He requests that I call back at a later time.

## 2012-10-03 LAB — METHYLMALONIC ACID, SERUM: Methylmalonic Acid, Quantitative: 0.28 umol/L (ref <0.40)

## 2012-10-08 ENCOUNTER — Encounter (HOSPITAL_BASED_OUTPATIENT_CLINIC_OR_DEPARTMENT_OTHER): Payer: Medicare HMO

## 2012-10-08 DIAGNOSIS — C099 Malignant neoplasm of tonsil, unspecified: Secondary | ICD-10-CM

## 2012-10-08 DIAGNOSIS — E876 Hypokalemia: Secondary | ICD-10-CM

## 2012-10-08 LAB — CBC WITH DIFFERENTIAL/PLATELET
Basophils Relative: 0 % (ref 0–1)
Eosinophils Absolute: 0.1 10*3/uL (ref 0.0–0.7)
HCT: 47.3 % (ref 39.0–52.0)
Hemoglobin: 16.4 g/dL (ref 13.0–17.0)
Lymphs Abs: 1.2 10*3/uL (ref 0.7–4.0)
MCH: 33.7 pg (ref 26.0–34.0)
MCHC: 34.7 g/dL (ref 30.0–36.0)
Monocytes Absolute: 0.6 10*3/uL (ref 0.1–1.0)
Monocytes Relative: 7 % (ref 3–12)
RBC: 4.87 MIL/uL (ref 4.22–5.81)

## 2012-10-08 LAB — BASIC METABOLIC PANEL
BUN: 22 mg/dL (ref 6–23)
Chloride: 97 mEq/L (ref 96–112)
Creatinine, Ser: 0.52 mg/dL (ref 0.50–1.35)
GFR calc Af Amer: >90 mL/min (ref >90)
GFR calc non Af Amer: >90 mL/min (ref >90)
Glucose, Bld: 149 mg/dL — ABNORMAL HIGH (ref 70–99)
Potassium: 4.1 mEq/L (ref 3.5–5.1)

## 2012-10-08 NOTE — Progress Notes (Signed)
Zachary Fowler's reason for visit today are for labs as scheduled per MD orders.  Venipuncture performed with a 23 gauge butterfly needle to R Antecubital.  Zachary Fowler tolerated venipuncture well and without incident; questions were answered and patient was discharged.

## 2012-10-13 ENCOUNTER — Encounter (HOSPITAL_COMMUNITY): Payer: Self-pay | Admitting: Oncology

## 2012-10-13 NOTE — Progress Notes (Signed)
-  No Show, letter sent- 

## 2012-10-14 ENCOUNTER — Ambulatory Visit (HOSPITAL_COMMUNITY): Payer: Medicare HMO | Admitting: Oncology

## 2012-10-28 ENCOUNTER — Telehealth (HOSPITAL_COMMUNITY): Payer: Self-pay | Admitting: Dietician

## 2012-10-28 NOTE — Telephone Encounter (Signed)
Received voicemail left at 1015 from Deveron Furlong, RD at [employer].

## 2012-10-28 NOTE — Telephone Encounter (Signed)
Called back at 1451. Lillia Abed gave update on [name] and asked if this RD had assessed since then. Informed Lillia Abed that Zachary Fowler has not been assessed since 10/02/12 and informed her that I recommended to oncologist to continue regimen of 5 cans of Isosource 1.5 daily. Lillia Abed reports Zachary Fowler doing very well on regimen and has gained weight (current wt: 145# per her records). No updated data in [facility], as Zachary Fowler missed last appt.

## 2012-11-08 ENCOUNTER — Encounter (HOSPITAL_COMMUNITY): Payer: Medicare HMO | Attending: Oncology

## 2012-11-08 VITALS — BP 129/82 | HR 87 | Temp 98.5°F | Resp 16 | Wt 162.6 lb

## 2012-11-08 DIAGNOSIS — C099 Malignant neoplasm of tonsil, unspecified: Secondary | ICD-10-CM

## 2012-11-08 NOTE — Progress Notes (Signed)
History of present illness:  Patient returns to clinic today for further evaluation. He is tolerating his daily XRT well without significant side effects. He continues to be NPO and requires his PEG tube for all his medications and nutritional needs. He does not report any neurologic complaints. He denies any mouth pain. He has no chest pain or shortness of breath. He denies any nausea, vomiting, constipation, or diarrhea. He has no abdominal pain. He has no urinary complaints. Patient otherwise feels well and offers no further specific complaints today.  Review of systems: As per history of present illness, otherwise a 10 system review is negative.  Past medical history, social history, family history have been reviewed and are unchanged.  General:  No acute distress. Mental status: Normal affect. HEENT: Oropharynx without thrush or erythema. No palpable lymphadenopathy. Eyes: Anicteric sclera. Respiratory: Clear to auscultation bilaterally. Cardiovascular: Regular rate and rhythm, no rubs, murmurs, or gallops. Gastrointestinal: Soft, nontender, nondistended, normoactive bowel sounds.  PEG tube noted Musculoskeletal: No edema. Skin: No rashes or petechiae noted. Neurological: Alert, answering all questions appropriately. Cranial nerves grossly intact.    Assessment and plan:  1. 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  Patient reports he is tolerating his XRT well and has approximately 3 weeks after treatment. Previously he declined concurrent chemotherapy, plus his performance status would likely have prohibited treatments.  Patient will return to clinic in approximately one month, after the completion is XRT, for further evaluation. At that point he will require scheduling of a restaging PET scan 2-3 months later. Patient expressed understanding and was in agreement with  this plan. 2. Alcoholism: Unclear if patient is still actively drinking. 3. Folic acid/B12 deficiency: Continue supplementation as prescribed. 4. Hypokalemia: Patient was given a prescription for potassium elixir today. 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  Monitor.

## 2012-11-08 NOTE — Patient Instructions (Signed)
[facility] Discharge Instructions  RECOMMENDATIONS MADE BY THE CONSULTANT AND ANY TEST RESULTS WILL BE SENT TO YOUR REFERRING PHYSICIAN.  EXAM FINDINGS BY THE PHYSICIAN TODAY AND SIGNS OR SYMPTOMS TO REPORT TO CLINIC OR PRIMARY PHYSICIAN: Exam findings as discussed by Dr. Orlie Dakin.  SPECIAL INSTRUCTIONS/FOLLOW-UP: 1.  Potassium elixir called in as a refill to [facility] . 2.  Follow up with your gastroenterologist about when you can start to take food/liquids by mouth. 3.  Return in 1 month, at which time we'll repeat lab work and discuss possibly repeating a PET scan for re-staging.  Thank you for choosing [facility] to provide your oncology and hematology care.  To afford each patient quality time with our providers, please arrive at least 15 minutes before your scheduled appointment time.  With your help, our goal is to use those 15 minutes to complete the necessary work-up to ensure our physicians have the information they need to help with your evaluation and healthcare recommendations.    Effective January 1st, 2014, we ask that you re-schedule your appointment with our physicians should you arrive 10 or more minutes late for your appointment.  We strive to give you quality time with our providers, and arriving late affects you and other patients whose appointments are after yours.    Again, thank you for choosing [facility].  Our hope is that these requests will decrease the amount of time that you wait before being seen by our physicians.       _____________________________________________________________  Should you have questions after your visit to [facility], please contact our office at [phone] between the hours of 8:30 a.m. and 5:00 p.m.  Voicemails left after 4:30 p.m. will not be returned until the following business day.  For prescription refill requests, have your pharmacy contact our office with your  prescription refill request.

## 2012-11-11 ENCOUNTER — Telehealth (HOSPITAL_COMMUNITY): Payer: Self-pay | Admitting: Dietician

## 2012-11-11 NOTE — Telephone Encounter (Signed)
Nutrition Note  Pt identified at risk for malnutrition due to Zachary Fowler Nutrition Screen, with MST=3.   Pt with stage IV squamous cell carcinoma of the tonsil. He is NPO and on TF, managed by [employer]. Spoke with RD Deveron Furlong) on 10/28/12 and RD reports pt is tolerating TF regimen well and is gaining wt. Pt also on K and vitamin D supplements due to deficiencies.   Wt Readings from Last 10 Encounters:  11/08/12 162 lb 9.6 oz (73.755 kg)  09/30/12 153 lb 12.8 oz (69.763 kg)  09/19/12 143 lb (64.864 kg)  09/19/12 143 lb (64.864 kg)  09/18/12 143 lb (64.864 kg)  09/16/12 143 lb 4.8 oz (65 kg)  07/25/12 170 lb 3.1 oz (77.2 kg)  02/21/11 162 lb 0.6 oz (73.5 kg)   Chart reviewed indicates that pt has gained 9# (5.9%) x 1 month, which is clinically significant. Wt gain is desirable, due to hx of poor po intake and wt loss.  Pt remains on 5 cans of Isosource 1.5 daily. Regimen provides 175 kcals, 85 grams protein, and 1870 ml fluid daily, with prescribed flushes. TF regimen provides 85% of estimated energy needs, 92% of estimated protein needs, and 85% of estimated fluid needs.  No new recommendations at this time, but will continue to follow.   Melody Haver, RD, LDN Pager: [phone] Calorie needs adjusted, due to weight gsin: 2212-2581 kcals daily, 92-111 grams protein daily, 2.2-2.6 L fluid daily.

## 2012-12-09 ENCOUNTER — Encounter (HOSPITAL_COMMUNITY): Payer: Medicare HMO | Attending: Oncology

## 2012-12-09 ENCOUNTER — Encounter (HOSPITAL_COMMUNITY): Payer: Self-pay

## 2012-12-09 VITALS — BP 140/85 | HR 133 | Temp 98.4°F | Resp 18 | Wt 161.8 lb

## 2012-12-09 DIAGNOSIS — B370 Candidal stomatitis: Secondary | ICD-10-CM | POA: Insufficient documentation

## 2012-12-09 DIAGNOSIS — R6884 Jaw pain: Secondary | ICD-10-CM | POA: Insufficient documentation

## 2012-12-09 DIAGNOSIS — E538 Deficiency of other specified B group vitamins: Secondary | ICD-10-CM | POA: Insufficient documentation

## 2012-12-09 DIAGNOSIS — R110 Nausea: Secondary | ICD-10-CM

## 2012-12-09 DIAGNOSIS — D510 Vitamin B12 deficiency anemia due to intrinsic factor deficiency: Secondary | ICD-10-CM

## 2012-12-09 DIAGNOSIS — C099 Malignant neoplasm of tonsil, unspecified: Secondary | ICD-10-CM | POA: Insufficient documentation

## 2012-12-09 LAB — CBC WITH DIFFERENTIAL/PLATELET
Basophils Absolute: 0 10*3/uL (ref 0.0–0.1)
Basophils Relative: 0 % (ref 0–1)
Eosinophils Absolute: 0.1 10*3/uL (ref 0.0–0.7)
HCT: 44.4 % (ref 39.0–52.0)
Hemoglobin: 14.8 g/dL (ref 13.0–17.0)
MCH: 34.2 pg — ABNORMAL HIGH (ref 26.0–34.0)
MCHC: 33.3 g/dL (ref 30.0–36.0)
Monocytes Absolute: 0.6 10*3/uL (ref 0.1–1.0)
Monocytes Relative: 6 % (ref 3–12)
Neutro Abs: 7.9 10*3/uL — ABNORMAL HIGH (ref 1.7–7.7)
RDW: 15.1 % (ref 11.5–15.5)

## 2012-12-09 LAB — BASIC METABOLIC PANEL
BUN: 12 mg/dL (ref 6–23)
Calcium: 10.4 mg/dL (ref 8.4–10.5)
Creatinine, Ser: 0.64 mg/dL (ref 0.50–1.35)
GFR calc Af Amer: >90 mL/min (ref >90)
GFR calc non Af Amer: >90 mL/min (ref >90)

## 2012-12-09 MED ORDER — ONDANSETRON 8 MG PO TBDP: 8.0000 mg | ORAL_TABLET | Freq: Three times a day (TID) | ORAL | Status: DC | PRN

## 2012-12-09 MED ORDER — FOLIC ACID 1 MG PO TABS: 1.0000 mg | ORAL_TABLET | Freq: Two times a day (BID) | ORAL | Status: AC

## 2012-12-09 MED ORDER — FENTANYL 25 MCG/HR TD PT72: MEDICATED_PATCH | TRANSDERMAL | Status: DC

## 2012-12-09 MED ORDER — LIDOCAINE VISCOUS 2 % MT SOLN: 5.0000 mL | OROMUCOSAL | Status: DC | PRN

## 2012-12-09 MED ORDER — OXYCODONE HCL 5 MG/5ML PO SOLN: 10.0000 mg | ORAL | Status: DC | PRN

## 2012-12-09 NOTE — Progress Notes (Signed)
[facility] OFFICE PROGRESS NOTE  PCP: Kirk Ruths, MD [address] [place] [address] [place] [address]  DIAGNOSIS: SQUAMOUS CANCER TONSIL,   CURRENT THERAPY: COMPLETED XRT 11/29/12  INTERVAL HISTORY: Zachary Fowler 67 y.o. male returns for    MEDICAL HISTORY: Past Medical History  Diagnosis Date  . Hypertension   . Thrombocytopenia 02/17/2011  . Anemia 02/17/2011  . Hyponatremia 02/17/2011  . Hypomagnesemia 02/17/2011  . Hypophosphatemia 02/17/2011  . Subdural hematoma 02/17/2011  . Hypogonadism male 02/17/2011    Low testosterone level.  . Occasional tremors   . Cancer     tonsilar  . Anemia, pernicious   . Squamous cell carcinoma of tonsil 09/18/2012    SURGICAL HISTORY:  Past Surgical History  Procedure Laterality Date  . Cervical disc surgery      X2  . Lumbar disc surgery      X2  . Cataract extraction, bilateral    . Insertion of vena cava filter    . Esophagogastroduodenoscopy (egd) with propofol N/A 09/25/2012    Procedure: ESOPHAGOGASTRODUODENOSCOPY (EGD) WITH PROPOFOL;  Surgeon: Malissa Hippo, MD;  Location: [facility];  Service: Endoscopy;  Laterality: N/A;  . Peg placement N/A 09/25/2012    Procedure: PERCUTANEOUS ENDOSCOPIC GASTROSTOMY (PEG) PLACEMENT;  Surgeon: Malissa Hippo, MD;  Location: [facility];  Service: Endoscopy;  Laterality: N/A;      PROBLEM LIST : CEREBELLAR ATAXIA AND ALCOHOLIC NEUROPATHY, NON AMBULATORY, CANCER TONSIL, CHRONIC THRUSH, REECURRENT AND WORSENING JAW , AND THROAT PAIN , dysphasia, and nutrition from feeding tube   ALLERGIES:  has No Known Allergies.  MEDICATIONS: Current outpatient prescriptions:benzocaine (ORAJEL) 10 % mucosal gel, Use as directed 1 application in the mouth or throat as needed for pain., Disp: , Rfl: ;  cyanocobalamin (,VITAMIN B-12,) 1000 MCG/ML injection, Inject 1,000 mcg into the muscle every 30 (thirty) days., Disp: , Rfl: ;  lidocaine (XYLOCAINE) 2 % solution, Take 5 mLs by  mouth every 3 (three) hours as needed for pain (Mouth and throat pain)., Disp: 100 mL, Rfl: 2 nystatin (MYCOSTATIN) 100000 UNIT/ML suspension, Take 5 mLs (500,000 Units total) by mouth 4 (four) times daily., Disp: 120 mL, Rfl: 2;  ondansetron (ZOFRAN ODT) 8 MG disintegrating tablet, Take 1 tablet (8 mg total) by mouth every 8 (eight) hours as needed for nausea., Disp: 20 tablet, Rfl: 1;  oxyCODONE (ROXICODONE) 5 MG/5ML solution, Take 10 mLs (10 mg total) by mouth every 4 (four) hours as needed for pain., Disp: 500 mL, Rfl: 0 potassium chloride 20 MEQ/15ML (10%) solution, Take 20 mEq by mouth 2 (two) times daily. , Disp: , Rfl: ;  fentaNYL (DURAGESIC - DOSED MCG/HR) 25 MCG/HR patch, 12 MICROGRAM PATCH, Disp: 5 patch, Rfl: 0;  folic acid (FOLVITE) 1 MG tablet, Take 1 tablet (1 mg total) by mouth 2 (two) times daily., Disp: 60 tablet, Rfl: 1;  pantoprazole (PROTONIX) 40 MG tablet, Take 1 tablet (40 mg total) by mouth daily., Disp: 30 tablet, Rfl: 11 thiamine 100 MG tablet, Take 1 tablet (100 mg total) by mouth daily., Disp: 30 tablet, Rfl: 1    REVIEW OF SYSTEMS:  Denied headache. Has steady moderate pain in the jaw left-sided facial preauricular also inside the mouth, worse at times worse if he opens his mouth and move his tongue, pain in the joint TM joint on mouth opening, and general weakness, tremor, incoordination, no abdominal pain, soft stool but no diarrhea, no abdominal pain or or discomfort related to  feeding tube, no back pain no rash bruising no edema  PHYSICAL EXAMINATION: ECOG PERFORMANCE STATUS: 2 - Symptomatic, <50% confined to bed  Filed Vitals:   12/09/12 1300  BP: 140/85  Pulse: 133  Temp: 98.4 F (36.9 C)  Resp: 18      SKIN:  No rashes or bruising  EYES: Sclera and conjuntiva clear  ENT: Positive for thrush, mouth opening is limited by pain and states this individual and TMJ, no obvious trismus nontender or spasm in the masseter  LYMPH: No palpable lymphadenopathy neck,  supraclavicular submandibular axilla  LUNGS: Clear to auscultation, no crackles or wheezes or rhonchi, decreased air entry HEART: Regular , tachycardia ABDOMEN: Abdomen soft, non-tender, , no masses or organomegaly , feeding tube site clean  EXTREMITIES: No lower ext edema NEURO: Alert & oriented, anxious, tremor, dysmetria, memory lapses, difficulty concentrating, tangential speech, and neurologic  baseline as per his brother   LABORATORY DATA: Results for orders placed in visit on 12/09/12 (from the past 48 hour(s))  CBC WITH DIFFERENTIAL     Status: Abnormal   Collection Time    12/09/12  1:04 PM      Result Value Range   WBC 9.3  4.0 - 10.5 K/uL   RBC 4.33  4.22 - 5.81 MIL/uL   Hemoglobin 14.8  13.0 - 17.0 g/dL   HCT 16.1  09.6 - 04.5 %   MCV 102.5 (*) 78.0 - 100.0 fL   MCH 34.2 (*) 26.0 - 34.0 pg   MCHC 33.3  30.0 - 36.0 g/dL   RDW 40.9  81.1 - 91.4 %   Platelets 246  150 - 400 K/uL   Neutrophils Relative % 85 (*) 43 - 77 %   Neutro Abs 7.9 (*) 1.7 - 7.7 K/uL   Lymphocytes Relative 8 (*) 12 - 46 %   Lymphs Abs 0.7  0.7 - 4.0 K/uL   Monocytes Relative 6  3 - 12 %   Monocytes Absolute 0.6  0.1 - 1.0 K/uL   Eosinophils Relative 1  0 - 5 %   Eosinophils Absolute 0.1  0.0 - 0.7 K/uL   Basophils Relative 0  0 - 1 %   Basophils Absolute 0.0  0.0 - 0.1 K/uL  BASIC METABOLIC PANEL     Status: Abnormal   Collection Time    12/09/12  1:04 PM      Result Value Range   Sodium 138  135 - 145 mEq/L   Potassium 4.0  3.5 - 5.1 mEq/L   Chloride 95 (*) 96 - 112 mEq/L   CO2 32  19 - 32 mEq/L   Glucose, Bld 117 (*) 70 - 99 mg/dL   BUN 12  6 - 23 mg/dL   Creatinine, Ser 7.82  0.50 - 1.35 mg/dL   Calcium 95.6  8.4 - 21.3 mg/dL   GFR calc non Af Amer >90  >90 mL/min   GFR calc Af Amer >90  >90 mL/min   Comment:            The eGFR has been calculated     using the CKD EPI equation.     This calculation has not been     validated in all clinical     situations.     eGFR's  persistently     <90 mL/min signify     possible Chronic Kidney Disease.      RADIOGRAPHIC STUDIES: No results found.   ASSESSMENT: Squamous cancer of the tonsil lymph  node involvement. Initial he was not a candidate for chemotherapy and has completed. radiation only. Significant comorbidity, of cerebellar dysfunction and neuropathy has been attributed to alcohol, ongoing alcohol use, nonambulatory, some cognitive impairment. He has chronic thrush. He has had jaw problems before and dysphagia problems even predated his cancer, he currently has some worsening symptoms that might represent radiation with tumor related but could be possibly TMJ disease. His pain is worse, his nutritional input is okay but he is only been on 2 feedings a day 1 and 3 have been recommended as optimal. He does not take by mouth except for fluids and he can take his liquid medication through a syringe   PLAN: Continued Roxicodone for breakthrough pain but started Duragesic patch for long-acting medication. Will titrate patches up every 2 days as appropriate. Discuss this with his brother who monitors his feedings and his narcotics. Will increase his feedings to 3 times a day check his labs today. I discussed the findings with ENT. Have agreed to do a CT scan of the neck with contrast and an ENT exam proper 2 weeks or sooner when necessary   All questions were answered. The patient knows to call the clinic with any problems, questions or concerns. We can certainly see the patient much sooner if necessary.     Marin Roberts, MD 12/09/2012 6:02 PM

## 2012-12-09 NOTE — Patient Instructions (Signed)
[facility] Discharge Instructions  RECOMMENDATIONS MADE BY THE CONSULTANT AND ANY TEST RESULTS WILL BE SENT TO YOUR REFERRING PHYSICIAN.  EXAM FINDINGS BY THE PHYSICIAN TODAY AND SIGNS OR SYMPTOMS TO REPORT TO CLINIC OR PRIMARY PHYSICIAN: Exam findings as discussed by Dr. Lorre Nick.  MEDICATIONS REFILLED AND PRESCRIBED:  1.  Refills given for Folic Acid, Lidocaine, Zofran. 2.  New prescriptions given for Oxycodone (dose increase) and new prescription for Duragesic patch given.    SPECIAL INSTRUCTIONS/FOLLOW-UP: 1.  Dr. Lorre Nick will get in contact with Dr. Suszanne Conners regarding your jaw pain and decrease in range in motion - he will decide whether you need CTs or to be seen in the office with Teoh.    Thank you for choosing [facility] to provide your oncology and hematology care.  To afford each patient quality time with our providers, please arrive at least 15 minutes before your scheduled appointment time.  With your help, our goal is to use those 15 minutes to complete the necessary work-up to ensure our physicians have the information they need to help with your evaluation and healthcare recommendations.    Effective January 1st, 2014, we ask that you re-schedule your appointment with our physicians should you arrive 10 or more minutes late for your appointment.  We strive to give you quality time with our providers, and arriving late affects you and other patients whose appointments are after yours.    Again, thank you for choosing [facility].  Our hope is that these requests will decrease the amount of time that you wait before being seen by our physicians.       _____________________________________________________________  Should you have questions after your visit to [facility], please contact our office at [phone] between the hours of 8:30 a.m. and 5:00 p.m.  Voicemails left after 4:30 p.m. will not be returned until the  following business day.  For prescription refill requests, have your pharmacy contact our office with your prescription refill request.

## 2012-12-13 ENCOUNTER — Ambulatory Visit (HOSPITAL_COMMUNITY)
Admission: RE | Admit: 2012-12-13 | Discharge: 2012-12-13 | Disposition: A | Discharge status: Home / Self Care | Payer: Medicare HMO | Source: Ambulatory Visit | Attending: Internal Medicine | Admitting: Internal Medicine

## 2012-12-13 DIAGNOSIS — C099 Malignant neoplasm of tonsil, unspecified: Secondary | ICD-10-CM

## 2012-12-13 MED ORDER — IOHEXOL 300 MG/ML  SOLN
75.0000 mL | Freq: Once | INTRAMUSCULAR | Status: AC | PRN
  Administered 2012-12-13: 75 mL via INTRAVENOUS

## 2012-12-24 ENCOUNTER — Encounter (HOSPITAL_COMMUNITY): Payer: Medicare HMO

## 2012-12-24 ENCOUNTER — Encounter (HOSPITAL_COMMUNITY): Payer: Self-pay

## 2012-12-24 ENCOUNTER — Encounter (HOSPITAL_BASED_OUTPATIENT_CLINIC_OR_DEPARTMENT_OTHER): Payer: Medicare HMO

## 2012-12-24 VITALS — BP 123/77 | HR 105 | Temp 98.0°F | Resp 18 | Wt 163.7 lb

## 2012-12-24 DIAGNOSIS — F1010 Alcohol abuse, uncomplicated: Secondary | ICD-10-CM

## 2012-12-24 DIAGNOSIS — B370 Candidal stomatitis: Secondary | ICD-10-CM

## 2012-12-24 DIAGNOSIS — R070 Pain in throat: Secondary | ICD-10-CM

## 2012-12-24 DIAGNOSIS — C099 Malignant neoplasm of tonsil, unspecified: Secondary | ICD-10-CM

## 2012-12-24 DIAGNOSIS — R6884 Jaw pain: Secondary | ICD-10-CM

## 2012-12-24 DIAGNOSIS — F17200 Nicotine dependence, unspecified, uncomplicated: Secondary | ICD-10-CM

## 2012-12-24 MED ORDER — MORPHINE SULFATE (CONCENTRATE) 20 MG/ML PO SOLN: 10.0000 mg | ORAL | Status: DC | PRN

## 2012-12-24 MED ORDER — FENTANYL 50 MCG/HR TD PT72: 1.0000 | MEDICATED_PATCH | TRANSDERMAL | Status: DC

## 2012-12-24 MED ORDER — FLUCONAZOLE 100 MG PO TABS: 100.0000 mg | ORAL_TABLET | Freq: Every day | ORAL | Status: DC

## 2012-12-24 NOTE — Progress Notes (Signed)
[facility] [phone]) [phone]   [phone]) [phone]  OFFICE PROGRESS NOTE  Kirk Ruths, MD 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 male returns to the clinic today for for scheduled followup after reportedly completing radiation earlier in the month.  I had seen the patient previously on 09/17/2012 when he was newly diagnosed with squamous cell cancer of the head and neck following enlarged tonsil biopsy on 09/06/2012.Marland Kitchen  He does have an neuromuscular problem which is suspected to be related to heavy alcohol intake.  At that time I have that sent him for a CT scan with suspicion of the patient giving him concurrent chemotherapy with radiation. He was later seen by radiation oncology Dr Thersa Salt and Dr. Mariel Sleet and decision was made to forego chemotherapy.  She was then treated with radiation therapy alone.  He was more recently seen by Dr. Lorre Nick we'll order a CT of the neck and made adjustments to his analgesics regimen.  Tells me he is unable to afford or rubs the cord on and has been out of this for the past 2 weeks.  He complaints of arm joint pain and the protracted course of thrush.  Is noncompliant with nystatin therapy.  He continues to drink heavily at least 4 drinks last night on was unable to tell me how much he drinks he may be.  Continues also to smoke half a pack of cigarettes a day.  He denies nausea or vomiting.  He denies shortness of breath.    Presently he is on 25 mcg of fentanyl patch every 3 days and reportedly has 2 left.     MEDICAL HISTORY: Past Medical History  Diagnosis Date  . Hypertension   . Thrombocytopenia 02/17/2011  . Anemia 02/17/2011  . Hyponatremia 02/17/2011  . Hypomagnesemia 02/17/2011  . Hypophosphatemia 02/17/2011  . Subdural hematoma 02/17/2011  . Hypogonadism male 02/17/2011    Low  testosterone level.  . Occasional tremors   . Cancer     tonsilar  . Anemia, pernicious   . 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  MEDICATIONS:  Current Outpatient Prescriptions  Medication Sig Dispense Refill  . benzocaine (ORAJEL) 10 % mucosal gel Use as directed 1 application in the mouth or throat as needed for pain.      . cyanocobalamin (,VITAMIN B-12,) 1000 MCG/ML injection Inject 1,000 mcg into the muscle every 30 (thirty) days.      . folic acid (FOLVITE) 1 MG tablet Take 1 tablet (1 mg total) by mouth 2 (two) times daily.  60 tablet  1  . lidocaine (XYLOCAINE) 2 % solution Take 5 mLs by mouth every 3 (three) hours as needed for pain (Mouth and throat pain).  100 mL  2  . nystatin (MYCOSTATIN) 100000 UNIT/ML suspension Take 5 mLs (500,000 Units total) by mouth 4 (four) times daily.  120 mL  2  . ondansetron (ZOFRAN ODT) 8 MG disintegrating tablet Take 1 tablet (8 mg total) by mouth every 8 (eight) hours as needed for nausea.  20 tablet  1  . fentaNYL (DURAGESIC - DOSED MCG/HR) 50 MCG/HR Place 1 patch (50 mcg total) onto the skin every 3 (three) days.  10 patch  0  . fluconazole (DIFLUCAN) 100 MG tablet Take 1 tablet (100 mg  total) by mouth daily.  30 tablet  0  . morphine (ROXANOL) 20 MG/ML concentrated solution Take 0.5 mLs (10 mg total) by mouth every 2 (two) hours as needed for pain.  240 mL  0  . oxyCODONE (ROXICODONE) 5 MG/5ML solution Take 10 mLs (10 mg total) by mouth every 4 (four) hours as needed for pain.  500 mL  0  . pantoprazole (PROTONIX) 40 MG tablet Take 1 tablet (40 mg total) by mouth daily.  30 tablet  11  . potassium chloride 20 MEQ/15ML (10%) solution Take 20 mEq by mouth 2 (two) times daily.       Marland Kitchen thiamine 100 MG tablet Take 1 tablet (100 mg total) by mouth daily.  30 tablet  1   No current facility-administered medications for this visit.    SURGICAL HISTORY:  Past Surgical History  Procedure Laterality Date   . Cervical disc surgery      X2  . Lumbar disc surgery      X2  . Cataract extraction, bilateral    . Insertion of vena cava filter    . Esophagogastroduodenoscopy (egd) with propofol N/A 09/25/2012    Procedure: ESOPHAGOGASTRODUODENOSCOPY (EGD) WITH PROPOFOL;  Surgeon: Malissa Hippo, MD;  Location: [facility];  Service: Endoscopy;  Laterality: N/A;  . Peg placement N/A 09/25/2012    Procedure: PERCUTANEOUS ENDOSCOPIC GASTROSTOMY (PEG) PLACEMENT;  Surgeon: Malissa Hippo, MD;  Location: [facility];  Service: Endoscopy;  Laterality: N/A;     REVIEW OF SYSTEMS: 14 point review of system is as in the history above otherwise negative.  PHYSICAL EXAMINATION:  Blood pressure 123/77, pulse 105, temperature 98 F (36.7 C), temperature source Oral, resp. rate 18, weight 163 lb 11.2 oz (74.254 kg). GENERAL: No acute distress. Wheelchair confined. SKIN:  No rashes or significant lesions . No ecchymosis or petechial rash. HEAD: Normocephalic, No masses, lesions, tenderness or abnormalities  EYES: Conjunctiva are pink and non-injected and no jaundice ENT: unable to open the mouth widely without pain, thrush noted.  Some dental carrie in the left upper jaw was noted. LYMPH: left upper neck still has a little bit of  swelling and mild erythema. LUNGS: Decreased breath sounds otherwise clear with no crackles or wheezes HEART: regular rate & rhythm, no murmurs, no gallops, S1 normal and S2 normal and no S3. ABDOMEN: Abdomen soft, non-tender, no masses or organomegaly and no hepatosplenomegaly palpable EXTREMITIES: No edema, no skin discoloration or tenderness NEURO: Alert & oriented , generalized muscle weakness wheelchair confined.  Nonfocal.     LABORATORY DATA: Lab Results  Component Value Date   WBC 9.3 12/09/2012   HGB 14.8 12/09/2012   HCT 44.4 12/09/2012   MCV 102.5* 12/09/2012   PLT 246 12/09/2012      Chemistry      Component Value Date/Time   NA 138 12/09/2012 1304   K 4.0 12/09/2012 1304    CL 95* 12/09/2012 1304   CO2 32 12/09/2012 1304   BUN 12 12/09/2012 1304   CREATININE 0.64 12/09/2012 1304      Component Value Date/Time   CALCIUM 10.4 12/09/2012 1304   ALKPHOS 96 09/30/2012 1637   AST 27 09/30/2012 1637   ALT 10 09/30/2012 1637   BILITOT 0.6 09/30/2012 1637       RADIOGRAPHIC STUDIES: Ct Soft Tissue Neck W Contrast  12/13/2012   *RADIOLOGY REPORT*  Clinical Data:   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 Long history of alcohol use and  tobacco abuse.  CT NECK WITH CONTRAST  Technique:  Multidetector CT imaging of the neck was performed with intravenous contrast.  Contrast: 75mL OMNIPAQUE IOHEXOL 300 MG/ML  SOLN  Comparison: 09/10/2012 CT neck.  09/20/2012 PET.  Findings: The patient has undergone chemotherapy for treatment of the primary lesion.  No concurrent surgery or chemotherapy.  I am unable to determine if radiotherapy has been completed based on the electronic medical record.  There is increased mass effect in the region of the left palatine tonsil with effacement of the left glossotonsillar sulcus, vallecula, and oropharynx.  The left parapharyngeal fat is effaced as compared with the prior exam of 09/10/2012. I am unable to discern a clearly enhancing lesion, and I suspect the findings are related to post XRT edema. Progression of tumor would be unlikely given the recent radiation.  Recommend follow-up PET scan for further evaluation.  The previously identified 13 x 14 mm level II node has decreased approximately 50% now measuring 8 mm in short axis.  Unchanged 8 x 10 mm left level IB submandibular node.  No other significant interval changes are identified.  Prior cervical fusion with spondylosis.  Stable carotid atherosclerotic change.  Clear lung apices.  Normal larynx. A left premolar tooth has been removed.  IMPRESSION: Increased mass effect in the region of the left palatine tonsil in this patient recently undergoing radiation.  Ipsilateral level II  adenopathy is decreased in size.  Suspect post XRT edema although progression of tumor not excluded.  Recommend follow-up repeat PET scan.   Original Report Authenticated By: Davonna Belling, M.D.     ASSESSMENT:  Stage IV squamous cell head and neck cancer status post definitive radiation.  He was not gived chemotherapy due to his considered poor performance status and comorbidities. He continues to smoke and drink heavily.  His recent CT shows swelling which is not unexpected given the patient has finished radiation.  I discussed with the patient and his brother that to evaluate his response , it would be ideal after inflammation and associated swelling subsides or resolves. Patient also has a thrush. Throat pain/ jaw pain is most likely related to radiation and thrush. His pain is suboptimally controlled on present regimen and I think there is room for improvement.   PLAN:  1. Increase fentanyl to 50 mcg q 72 hours 2. I changed the oxycodone liquid stool Roxanol 10 mg every 2 hours when necessary for breakthrough pain. 3. I prescribed Diflucan for thrush, 100 mg daily to continue for 2 weeks after symptoms resolved. 4. Response assessment with PET/CT 8-12 weeks after radiation. 5. Patient was counseled on smoking cessation and to decrease alcohol intake. 6. Patient was counseled on good sleep hygiene . No benzodiazepam adviced at this time given the above changes in opiate regimen since this can potentiality interfere with respiration.   All questions were satisfactorily answered. Patient knows to call if  any concern arises.  I spent more than 50 % counseling the patient face to face. The total time spent in the appointment was 40 minutes.   Sherral Hammers, MD FACP. Hematology/Oncology.

## 2012-12-24 NOTE — Patient Instructions (Addendum)
[facility] Discharge Instructions  RECOMMENDATIONS MADE BY THE CONSULTANT AND ANY TEST RESULTS WILL BE SENT TO YOUR REFERRING PHYSICIAN.  EXAM FINDINGS BY THE PHYSICIAN TODAY AND SIGNS OR SYMPTOMS TO REPORT TO CLINIC OR PRIMARY PHYSICIAN: Exam and discussion by Dr. Sharia Reeve.  MEDICATIONS PRESCRIBED: Put on both fentanyl patches 25 mcg - when you get home and in 3 days start using only 1 of the new patches which are 50 mcg. Roxanol solution - you can take 0.99mls every 2 hours as needed for break through pain. Diflucan tablets - crush and mix with liquid and put in feeding tube once daily.  Continue for 2 weeks after symptoms have resolved.  INSTRUCTIONS GIVEN AND DISCUSSED:  Call Beatris Si next Wednesday with update on condition.  [phone]. Report uncontrolled pain.  SPECIAL INSTRUCTIONS/FOLLOW-UP: Follow-up in 1 month.  Thank you for choosing [facility] to provide your oncology and hematology care.  To afford each patient quality time with our providers, please arrive at least 15 minutes before your scheduled appointment time.  With your help, our goal is to use those 15 minutes to complete the necessary work-up to ensure our physicians have the information they need to help with your evaluation and healthcare recommendations.    Effective January 1st, 2014, we ask that you re-schedule your appointment with our physicians should you arrive 10 or more minutes late for your appointment.  We strive to give you quality time with our providers, and arriving late affects you and other patients whose appointments are after yours.    Again, thank you for choosing [facility].  Our hope is that these requests will decrease the amount of time that you wait before being seen by our physicians.       _____________________________________________________________  Should you have questions after your visit to [facility], please contact  our office at [phone] between the hours of 8:30 a.m. and 5:00 p.m.  Voicemails left after 4:30 p.m. will not be returned until the following business day.  For prescription refill requests, have your pharmacy contact our office with your prescription refill request.

## 2013-01-05 ENCOUNTER — Emergency Department (HOSPITAL_COMMUNITY): Payer: Medicare HMO

## 2013-01-05 ENCOUNTER — Other Ambulatory Visit: Payer: Self-pay

## 2013-01-05 ENCOUNTER — Encounter (HOSPITAL_COMMUNITY): Payer: Self-pay | Admitting: Emergency Medicine

## 2013-01-05 ENCOUNTER — Observation Stay (HOSPITAL_COMMUNITY)
Admission: EM | Admit: 2013-01-05 | Discharge: 2013-01-06 | DRG: 641 | Disposition: A | Discharge status: Home / Self Care | Payer: Medicare HMO | Attending: Internal Medicine | Admitting: Internal Medicine

## 2013-01-05 DIAGNOSIS — R9409 Abnormal results of other function studies of central nervous system: Secondary | ICD-10-CM | POA: Insufficient documentation

## 2013-01-05 DIAGNOSIS — I82220 Acute embolism and thrombosis of inferior vena cava: Secondary | ICD-10-CM | POA: Diagnosis present

## 2013-01-05 DIAGNOSIS — T40605A Adverse effect of unspecified narcotics, initial encounter: Secondary | ICD-10-CM | POA: Diagnosis present

## 2013-01-05 DIAGNOSIS — E871 Hypo-osmolality and hyponatremia: Secondary | ICD-10-CM

## 2013-01-05 DIAGNOSIS — S065X9A Traumatic subdural hemorrhage with loss of consciousness of unspecified duration, initial encounter: Secondary | ICD-10-CM

## 2013-01-05 DIAGNOSIS — E876 Hypokalemia: Secondary | ICD-10-CM

## 2013-01-05 DIAGNOSIS — F1020 Alcohol dependence, uncomplicated: Secondary | ICD-10-CM

## 2013-01-05 DIAGNOSIS — R1310 Dysphagia, unspecified: Secondary | ICD-10-CM

## 2013-01-05 DIAGNOSIS — Z923 Personal history of irradiation: Secondary | ICD-10-CM

## 2013-01-05 DIAGNOSIS — D696 Thrombocytopenia, unspecified: Secondary | ICD-10-CM

## 2013-01-05 DIAGNOSIS — E291 Testicular hypofunction: Secondary | ICD-10-CM

## 2013-01-05 DIAGNOSIS — R279 Unspecified lack of coordination: Secondary | ICD-10-CM

## 2013-01-05 DIAGNOSIS — Z931 Gastrostomy status: Secondary | ICD-10-CM

## 2013-01-05 DIAGNOSIS — Z66 Do not resuscitate: Secondary | ICD-10-CM | POA: Diagnosis present

## 2013-01-05 DIAGNOSIS — D510 Vitamin B12 deficiency anemia due to intrinsic factor deficiency: Secondary | ICD-10-CM | POA: Diagnosis present

## 2013-01-05 DIAGNOSIS — R269 Unspecified abnormalities of gait and mobility: Secondary | ICD-10-CM

## 2013-01-05 DIAGNOSIS — I10 Essential (primary) hypertension: Secondary | ICD-10-CM | POA: Diagnosis present

## 2013-01-05 DIAGNOSIS — C099 Malignant neoplasm of tonsil, unspecified: Secondary | ICD-10-CM | POA: Diagnosis present

## 2013-01-05 DIAGNOSIS — N390 Urinary tract infection, site not specified: Secondary | ICD-10-CM

## 2013-01-05 DIAGNOSIS — G319 Degenerative disease of nervous system, unspecified: Secondary | ICD-10-CM | POA: Diagnosis present

## 2013-01-05 DIAGNOSIS — R270 Ataxia, unspecified: Secondary | ICD-10-CM | POA: Diagnosis present

## 2013-01-05 DIAGNOSIS — Z79899 Other long term (current) drug therapy: Secondary | ICD-10-CM

## 2013-01-05 DIAGNOSIS — E860 Dehydration: Principal | ICD-10-CM

## 2013-01-05 DIAGNOSIS — K7010 Alcoholic hepatitis without ascites: Secondary | ICD-10-CM

## 2013-01-05 DIAGNOSIS — R4182 Altered mental status, unspecified: Secondary | ICD-10-CM | POA: Diagnosis present

## 2013-01-05 DIAGNOSIS — F17200 Nicotine dependence, unspecified, uncomplicated: Secondary | ICD-10-CM | POA: Diagnosis present

## 2013-01-05 DIAGNOSIS — E538 Deficiency of other specified B group vitamins: Secondary | ICD-10-CM

## 2013-01-05 DIAGNOSIS — C140 Malignant neoplasm of pharynx, unspecified: Secondary | ICD-10-CM | POA: Diagnosis present

## 2013-01-05 DIAGNOSIS — S065XAA Traumatic subdural hemorrhage with loss of consciousness status unknown, initial encounter: Secondary | ICD-10-CM

## 2013-01-05 DIAGNOSIS — IMO0001 Reserved for inherently not codable concepts without codable children: Secondary | ICD-10-CM | POA: Diagnosis present

## 2013-01-05 LAB — URINALYSIS, ROUTINE W REFLEX MICROSCOPIC
Bilirubin Urine: NEGATIVE
Leukocytes, UA: NEGATIVE
Nitrite: NEGATIVE
Specific Gravity, Urine: >1.03 — ABNORMAL HIGH (ref 1.005–1.030)
Urobilinogen, UA: 0.2 mg/dL (ref 0.0–1.0)
pH: 6 (ref 5.0–8.0)

## 2013-01-05 LAB — CBC WITH DIFFERENTIAL/PLATELET
Basophils Absolute: 0 10*3/uL (ref 0.0–0.1)
Eosinophils Absolute: 0 10*3/uL (ref 0.0–0.7)
Lymphocytes Relative: 2 % — ABNORMAL LOW (ref 12–46)
MCHC: 33.5 g/dL (ref 30.0–36.0)
Neutrophils Relative %: 89 % — ABNORMAL HIGH (ref 43–77)
RDW: 13.9 % (ref 11.5–15.5)
Smear Review: ADEQUATE

## 2013-01-05 LAB — BASIC METABOLIC PANEL
Calcium: 10.7 mg/dL — ABNORMAL HIGH (ref 8.4–10.5)
Creatinine, Ser: 1.67 mg/dL — ABNORMAL HIGH (ref 0.50–1.35)
GFR calc Af Amer: 48 mL/min — ABNORMAL LOW (ref >90)

## 2013-01-05 LAB — RAPID URINE DRUG SCREEN, HOSP PERFORMED
Barbiturates: NOT DETECTED
Benzodiazepines: NOT DETECTED
Cocaine: NOT DETECTED
Tetrahydrocannabinol: NOT DETECTED

## 2013-01-05 MED ORDER — ONDANSETRON HCL 4 MG/2ML IJ SOLN: 4.0000 mg | Freq: Four times a day (QID) | INTRAMUSCULAR | Status: DC | PRN

## 2013-01-05 MED ORDER — ONDANSETRON HCL 4 MG PO TABS: 4.0000 mg | ORAL_TABLET | Freq: Four times a day (QID) | ORAL | Status: DC | PRN

## 2013-01-05 MED ORDER — SODIUM CHLORIDE 0.9 % IV BOLUS (SEPSIS)
500.0000 mL | Freq: Once | INTRAVENOUS | Status: AC
  Administered 2013-01-05: 500 mL via INTRAVENOUS

## 2013-01-05 MED ORDER — FENTANYL 50 MCG/HR TD PT72
50.0000 ug | MEDICATED_PATCH | TRANSDERMAL | Status: DC
  Administered 2013-01-05: 50 ug via TRANSDERMAL
  Filled 2013-01-05: qty 1

## 2013-01-05 MED ORDER — LIDOCAINE VISCOUS 2 % MT SOLN: 5.0000 mL | OROMUCOSAL | Status: DC | PRN

## 2013-01-05 MED ORDER — SODIUM CHLORIDE 0.9 % IV BOLUS (SEPSIS)
1000.0000 mL | Freq: Once | INTRAVENOUS | Status: AC
  Administered 2013-01-05: 1000 mL via INTRAVENOUS

## 2013-01-05 MED ORDER — BENZOCAINE 10 % MT GEL: 1.0000 "application " | OROMUCOSAL | Status: DC | PRN

## 2013-01-05 MED ORDER — BENZOCAINE 20 % MT SOLN: OROMUCOSAL | Status: DC | PRN

## 2013-01-05 MED ORDER — ONDANSETRON HCL 4 MG/2ML IJ SOLN
4.0000 mg | Freq: Once | INTRAMUSCULAR | Status: AC
  Administered 2013-01-05: 4 mg via INTRAVENOUS
  Filled 2013-01-05: qty 2

## 2013-01-05 MED ORDER — SODIUM CHLORIDE 0.9 % IV SOLN
Freq: Once | INTRAVENOUS | Status: AC
  Administered 2013-01-05: 13:00:00 via INTRAVENOUS

## 2013-01-05 MED ORDER — MORPHINE SULFATE (CONCENTRATE) 10 MG /0.5 ML PO SOLN
10.0000 mg | ORAL | Status: DC | PRN
  Administered 2013-01-05 – 2013-01-06 (×2): 10 mg via ORAL
  Filled 2013-01-05 (×2): qty 0.5

## 2013-01-05 MED ORDER — POTASSIUM CHLORIDE 20 MEQ/15ML (10%) PO LIQD: 20.0000 meq | Freq: Two times a day (BID) | ORAL | Status: DC

## 2013-01-05 MED ORDER — SODIUM CHLORIDE 0.9 % IV SOLN: INTRAVENOUS | Status: DC

## 2013-01-05 MED ORDER — SODIUM CHLORIDE 0.9 % IV SOLN
INTRAVENOUS | Status: AC
  Administered 2013-01-05: 150 mL/h via INTRAVENOUS

## 2013-01-05 MED ORDER — ONDANSETRON 4 MG PO TBDP: 8.0000 mg | ORAL_TABLET | Freq: Three times a day (TID) | ORAL | Status: DC | PRN

## 2013-01-05 MED ORDER — VITAMIN B-1 100 MG PO TABS
100.0000 mg | ORAL_TABLET | Freq: Every day | ORAL | Status: DC
  Administered 2013-01-05 – 2013-01-06 (×2): 100 mg via ORAL
  Filled 2013-01-05 (×2): qty 1

## 2013-01-05 MED ORDER — HEPARIN SODIUM (PORCINE) 5000 UNIT/ML IJ SOLN
5000.0000 [IU] | Freq: Three times a day (TID) | INTRAMUSCULAR | Status: DC
  Administered 2013-01-05 – 2013-01-06 (×3): 5000 [IU] via SUBCUTANEOUS
  Filled 2013-01-05 (×3): qty 1

## 2013-01-05 MED ORDER — FOLIC ACID 1 MG PO TABS
1.0000 mg | ORAL_TABLET | Freq: Two times a day (BID) | ORAL | Status: DC
Start: 2013-01-05 — End: 2013-01-06
  Administered 2013-01-05 – 2013-01-06 (×3): 1 mg via ORAL
  Filled 2013-01-05 (×3): qty 1

## 2013-01-05 MED ORDER — PANTOPRAZOLE SODIUM 40 MG IV SOLR
40.0000 mg | Freq: Once | INTRAVENOUS | Status: AC
  Administered 2013-01-05: 40 mg via INTRAVENOUS
  Filled 2013-01-05: qty 40

## 2013-01-05 MED ORDER — CYANOCOBALAMIN 1000 MCG/ML IJ SOLN
1000.0000 ug | INTRAMUSCULAR | Status: DC
  Administered 2013-01-05: 1000 ug via INTRAMUSCULAR
  Filled 2013-01-05: qty 1

## 2013-01-05 NOTE — ED Provider Notes (Signed)
CSN: [id number]     Arrival date & time 01/05/13  0932 History   This chart was scribed for Donnetta Hutching, MD, by Yevette Edwards, ED Scribe. This patient was seen in room APA15/[facility]15 and the patient's care was started at 9:48 AM. First MD Initiated Contact with Patient 01/05/13 [phone]     Chief Complaint  Patient presents with  . Altered Mental Status   LEVEL FIVE CAVEAT (AMS)  (Consider location/radiation/quality/duration/timing/severity/associated sxs/prior Treatment) The history is provided by the patient, a relative and the EMS personnel. The history is limited by the condition of the patient. No language interpreter was used.   HPI Comments: Zachary Fowler is a 67 y.o. male, with a h/o stage 4 throat cancer, brought in by EMS to the ED  presenting with acute AMS. His daughters report that he is "listless." His brother reports that the pt was unresponsive this morning; he also reports the pt had an episode of red-colored emesis, but the brother denies hematemesis.  When questioned at bedside, the pt is orientated to self and place, but he is not orientated to time. The pt reports that he drank approximately a pint of vodka last night; he has a h/o alcohol abuse. The pt takes morphine at home due to the throat cancer; it is unknown how much morphine he took today. He also has a Fentanyl patch which was removed by EMS PTA, and he has a hydrocodone prescription.  He recently finished radiation for the throat cancer. The pt is not ambulatory at baseline.   Past Medical History  Diagnosis Date  . Hypertension   . Thrombocytopenia 02/17/2011  . Anemia 02/17/2011  . Hyponatremia 02/17/2011  . Hypomagnesemia 02/17/2011  . Hypophosphatemia 02/17/2011  . Subdural hematoma 02/17/2011  . Hypogonadism male 02/17/2011    Low testosterone level.  . Occasional tremors   . Cancer     tonsilar  . Anemia, pernicious   . 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 Cervical disc surgery      X2  . Lumbar disc surgery      X2  . Cataract extraction, bilateral    . Insertion of vena cava filter    . Esophagogastroduodenoscopy (egd) with propofol N/A 09/25/2012    Procedure: ESOPHAGOGASTRODUODENOSCOPY (EGD) WITH PROPOFOL;  Surgeon: Malissa Hippo, MD;  Location: [facility];  Service: Endoscopy;  Laterality: N/A;  . Peg placement N/A 09/25/2012    Procedure: PERCUTANEOUS ENDOSCOPIC GASTROSTOMY (PEG) PLACEMENT;  Surgeon: Malissa Hippo, MD;  Location: [facility];  Service: Endoscopy;  Laterality: N/A;   Family History  Problem Relation Age of Onset  . Cancer Mother 44    pancreatic cancer  . Diabetes Daughter    History  Substance Use Topics  . Smoking status: Current Every Day Smoker -- 0.50 packs/day for 50 years  . Smokeless tobacco: Never Used  . Alcohol Use: Yes     Comment: 12 gallon/wk vodka    Review of Systems  Unable to perform ROS: Mental status change    Allergies  Review of patient's allergies indicates no known allergies.  Home Medications   Current Outpatient Rx  Name  Route  Sig  Dispense  Refill  . benzocaine (ORAJEL) 10 % mucosal gel   Mouth/Throat   Use as directed 1 application in the mouth or throat as needed for pain.         . cyanocobalamin (,VITAMIN B-12,) 1000 MCG/ML  injection   Intramuscular   Inject 1,000 mcg into the muscle every 30 (thirty) days.         . fentaNYL (DURAGESIC - DOSED MCG/HR) 50 MCG/HR   Transdermal   Place 1 patch (50 mcg total) onto the skin every 3 (three) days.   10 patch   0   . fluconazole (DIFLUCAN) 100 MG tablet   Oral   Take 1 tablet (100 mg total) by mouth daily.   30 tablet   0     Crush and put into PEG tube. Continue for 2 weeks  ...   . folic acid (FOLVITE) 1 MG tablet   Oral   Take 1 tablet (1 mg total) by mouth 2 (two) times daily.   60 tablet   1   . lidocaine (XYLOCAINE) 2 % solution   Oral   Take 5 mLs by mouth every 3 (three) hours as  needed for pain (Mouth and throat pain).   100 mL   2   . morphine (ROXANOL) 20 MG/ML concentrated solution   Oral   Take 0.5 mLs (10 mg total) by mouth every 2 (two) hours as needed for pain.   240 mL   0   . nystatin (MYCOSTATIN) 100000 UNIT/ML suspension   Oral   Take 5 mLs (500,000 Units total) by mouth 4 (four) times daily.   120 mL   2     Continue  48 hrs  after symptoms resolve   . ondansetron (ZOFRAN ODT) 8 MG disintegrating tablet   Oral   Take 1 tablet (8 mg total) by mouth every 8 (eight) hours as needed for nausea.   20 tablet   1     Instruct for patient to lay the tablet ontop of hi ...   . oxyCODONE (ROXICODONE) 5 MG/5ML solution   Oral   Take 10 mLs (10 mg total) by mouth every 4 (four) hours as needed for pain.   500 mL   0   . pantoprazole (PROTONIX) 40 MG tablet   Oral   Take 1 tablet (40 mg total) by mouth daily.   30 tablet   11   . potassium chloride 20 MEQ/15ML (10%) solution   Oral   Take 20 mEq by mouth 2 (two) times daily.          Marland Kitchen thiamine 100 MG tablet   Oral   Take 1 tablet (100 mg total) by mouth daily.   30 tablet   1    Triage Vitals: BP 92/58  Pulse 113  Temp(Src) 97.8 F (36.6 C) (Oral)  Resp 13  SpO2 92%  Physical Exam  Nursing note and vitals reviewed. Constitutional: He appears well-developed and well-nourished.  HENT:  Head: Normocephalic and atraumatic.  Eyes: Conjunctivae and EOM are normal. Pupils are equal, round, and reactive to light.  Neck: Normal range of motion. Neck supple.  Cardiovascular: Normal rate, regular rhythm and normal heart sounds.   Pulmonary/Chest: Effort normal and breath sounds normal.  Abdominal: Soft. Bowel sounds are normal.  Musculoskeletal: Normal range of motion.  Neurological: He is alert.  Orientated to self and place. 1/5 strength for extremities.  Responded slowly to questions.   Skin: Skin is warm and dry.    ED Course  Procedures (including critical care  time)  DIAGNOSTIC STUDIES: Oxygen Saturation is 92% on room air, low by my interpretation.    COORDINATION OF CARE:  9:59 AM- Discussed treatment plan with patient, and the  patient agreed to the plan.   Labs Review Labs Reviewed  CBC WITH DIFFERENTIAL - Abnormal; Notable for the following:    WBC 18.3 (*)    MCV 103.7 (*)    MCH 34.7 (*)    Neutrophils Relative % 89 (*)    Lymphocytes Relative 2 (*)    Neutro Abs 16.3 (*)    Lymphs Abs 0.4 (*)    Monocytes Absolute 1.6 (*)    All other components within normal limits  BASIC METABOLIC PANEL - Abnormal; Notable for the following:    Sodium 134 (*)    Potassium 5.6 (*)    Chloride 89 (*)    Glucose, Bld 137 (*)    Creatinine, Ser 1.67 (*)    Calcium 10.7 (*)    GFR calc non Af Amer 41 (*)    GFR calc Af Amer 48 (*)    All other components within normal limits  URINE RAPID DRUG SCREEN (HOSP PERFORMED) - Abnormal; Notable for the following:    Opiates POSITIVE (*)    All other components within normal limits  ETHANOL   Imaging Review Ct Head Wo Contrast  01/05/2013   *RADIOLOGY REPORT*  Clinical Data: Altered mental status.  CT HEAD WITHOUT CONTRAST  Technique:  Contiguous axial images were obtained from the base of the skull through the vertex without contrast.  Comparison: Head CT 02/17/2011.  Findings: Mild cerebral and cerebellar atrophy.  A subtle area of decreased attenuation in the deep white matter of the posterior right frontal lobe (images 20 and 21 of series 2), new compared to the prior examination from 02/17/2011, but nonspecific. No other potential acute intracranial abnormalities.  Specifically, no evidence of acute intracranial hemorrhage, no mass, mass effect, hydrocephalus or abnormal intra or extra-axial fluid collections. Large left mastoid effusion and trace right mastoid effusion. Multifocal mucosal thickening and opacification throughout the ethmoid sinuses bilaterally, as well as some mucosal thickening in the  maxillary sinuses bilaterally.  No acute displaced skull fractures are identified.  IMPRESSION: 1.  Ill-defined areas of low attenuation in the posterior right frontal deep white matter compared to prior study 02/17/2011.  This appearance could be compatible with chronic microvascular ischemic disease, however, an early area of acute or subacute ischemia could have a similar appearance.  If there is clinical concern for acute ischemia, further evaluation with brain MRI may provide additional diagnostic information. 2.  Large left mastoid and trace right mastoid effusions. 3.  Paranasal sinus disease, as above.  These results were called by telephone on 01/05/2013 at 11:30 a.m. to Dr. Adriana Simas, who verbally acknowledged these results.   Original Report Authenticated By: Trudie Reed, M.D.   Dg Chest Portable 1 View  01/05/2013   *RADIOLOGY REPORT*  Clinical Data: Altered mental status.  PORTABLE CHEST - 1 VIEW  Comparison: 02/16/2011  Findings: Lungs are hypoinflated but otherwise clear. Cardiomediastinal silhouette and remainder of the exam is unchanged.  IMPRESSION: Hypoinflation without acute cardiopulmonary disease.   Original Report Authenticated By: Elberta Fortis, M.D.    Date: 01/05/2013  Rate: 111  Rhythm: sinus tachy  QRS Axis: left  Intervals: normal  ST/T Wave abnormalities: normal  Conduction Disutrbances: none  Narrative Interpretation: unremarkable  CRITICAL CARE Performed by: Donnetta Hutching  ?  Total critical care time: 30  Critical care time was exclusive of separately billable procedures and treating other patients.  Critical care was necessary to treat or prevent imminent or life-threatening deterioration.  Critical care was time spent personally by  me on the following activities: development of treatment plan with patient and/or surrogate as well as nursing, discussions with consultants, evaluation of patient's response to treatment, examination of patient, obtaining history from  patient or surrogate, ordering and performing treatments and interventions, ordering and review of laboratory studies, ordering and review of radiographic studies, pulse oximetry and re-evaluation of patient's condition.  MDM  No diagnosis found. Patient is very complex. He has a history of throat cancer and is fed by a feeding tube. Family reports altered mental status. He drinks heavily. He also takes pain medication.  CT scan of brain shows possible subacute ischemia in the posterior right frontal lobe. Admit to general medicine.        Donnetta Hutching, MD 01/05/13 1355

## 2013-01-05 NOTE — ED Notes (Signed)
Admitting MD at bedside. Will transport when completed.

## 2013-01-05 NOTE — H&P (Signed)
[facility] History and Physical  Zachary Fowler ZOX:[id number] DOB: 12-15-1945 DOA: 01/05/2013  Referring physician: Dr. Adriana Simas, ER. PCP: Kirk Ruths, MD  Specialists: Oncology.  Chief Complaint: Altered mental status.  HPI: Zachary Fowler is a 67 y.o. male who presents with a 24-hour history of altered mental status. When he came to the emergency room, he was probably quite drowsy, now he has improved significantly, is able to give me a reasonable history. He tells me that yesterday evening his speech became more slurred. He has a history of alcoholism and he admits to drinking a pint of vodka even yesterday. He also has been taking opioids for neck pain. He normally takes fentanyl patch but 2-3 days ago he was also started on Roxanol in addition because of intractable pain. The patient has a history of throat cancer, squamous cell and has had radiotherapy for this.   Review of Systems: Apart from history of present illness, other systems negative.  Past Medical History  Diagnosis Date  . Hypertension   . Thrombocytopenia 02/17/2011  . Anemia 02/17/2011  . Hyponatremia 02/17/2011  . Hypomagnesemia 02/17/2011  . Hypophosphatemia 02/17/2011  . Subdural hematoma 02/17/2011  . Hypogonadism male 02/17/2011    Low testosterone level.  . Occasional tremors   . Cancer     tonsilar  . Anemia, pernicious   . 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 Cervical disc surgery      X2  . Lumbar disc surgery      X2  . Cataract extraction, bilateral    . Insertion of vena cava filter    . Esophagogastroduodenoscopy (egd) with propofol N/A 09/25/2012    Procedure: ESOPHAGOGASTRODUODENOSCOPY (EGD) WITH PROPOFOL;  Surgeon: Malissa Hippo, MD;  Location: [facility];  Service: Endoscopy;  Laterality: N/A;  . Peg placement N/A 09/25/2012    Procedure: PERCUTANEOUS ENDOSCOPIC GASTROSTOMY (PEG) PLACEMENT;  Surgeon: Malissa Hippo, MD;   Location: [facility];  Service: Endoscopy;  Laterality: N/A;   Social History:  reports that he has been smoking.  He has never used smokeless tobacco. He reports that  drinks alcohol. He reports that he does not use illicit drugs. 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 He is usually fed by PEG tube.  No Known Allergies  Family History  Problem Relation Age of Onset  . Cancer Mother 51    pancreatic cancer  . Diabetes Daughter       Prior to Admission medications   Medication Sig Start Date End Date Taking? Authorizing Provider  benzocaine (ORAJEL) 10 % mucosal gel Use as directed 1 application in the mouth or throat as needed for pain.   Yes Historical Provider, MD  cyanocobalamin (,VITAMIN B-12,) 1000 MCG/ML injection Inject 1,000 mcg into the muscle every 30 (thirty) days.   Yes Historical Provider, MD  fentaNYL (DURAGESIC - DOSED MCG/HR) 50 MCG/HR Place 1 patch (50 mcg total) onto the skin every 3 (three) days. 12/24/12  Yes Sherral Hammers, MD  fluconazole (DIFLUCAN) 100 MG tablet Take 1 tablet (100 mg total) by mouth daily. 12/24/12  Yes Sherral Hammers, MD  folic acid (FOLVITE) 1 MG tablet Take 1 tablet (1 mg total) by mouth 2 (two) times daily. 12/09/12  Yes Marin Roberts, MD  lidocaine (XYLOCAINE) 2 % solution Take 5 mLs by mouth every 3 (three) hours as needed for pain (Mouth and throat pain). 12/09/12  Yes  Marin Roberts, MD  morphine (ROXANOL) 20 MG/ML concentrated solution Take 0.5 mLs (10 mg total) by mouth every 2 (two) hours as needed for pain. 12/24/12  Yes Sherral Hammers, MD  ondansetron (ZOFRAN ODT) 8 MG disintegrating tablet Take 1 tablet (8 mg total) by mouth every 8 (eight) hours as needed for nausea. 12/09/12  Yes Marin Roberts, MD  potassium chloride 20 MEQ/15ML (10%) solution Take 20 mEq by mouth 2 (two) times daily.  10/01/12  Yes Historical Provider, MD  thiamine 100 MG tablet Take 1 tablet (100 mg total) by mouth daily. 09/30/12  Yes Randall An, MD   Physical Exam: Filed Vitals:   01/05/13 1230  BP: 85/59  Pulse: 95  Temp:   Resp: 15     General:  He looks clinically dehydrated.  Eyes: No pallor. No jaundice.  ENT: No acute abnormalities.  Neck: No neck lymphadenopathy.  Cardiovascular: Heart sounds are present without murmur that sounds.  Respiratory: Lung fields are clear.  Abdomen: Soft, nontender. PEG tube site does not look infected.  Skin: No rash.  Musculoskeletal: No acute joint abnormalities.  Psychiatric: Appropriate affect.  Neurologic: Alert and orientated. 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 He does not appear to have any new pyramidal  weakness.  Labs on Admission:  Basic Metabolic Panel:  Recent Labs Lab 01/05/13 1016  NA 134*  K 5.6*  CL 89*  CO2 31  GLUCOSE 137*  BUN 22  CREATININE 1.67*  CALCIUM 10.7*       CBC:  Recent Labs Lab 01/05/13 1016  WBC 18.3*  NEUTROABS 16.3*  HGB 14.9  HCT 44.5  MCV 103.7*  PLT PLATELET CLUMPS NOTED ON SMEAR, COUNT APPEARS ADEQUATE     Radiological Exams on Admission: Ct Head Wo Contrast  01/05/2013   *RADIOLOGY REPORT*  Clinical Data: Altered mental status.  CT HEAD WITHOUT CONTRAST  Technique:  Contiguous axial images were obtained from the base of the skull through the vertex without contrast.  Comparison: Head CT 02/17/2011.  Findings: Mild cerebral and cerebellar atrophy.  A subtle area of decreased attenuation in the deep white matter of the posterior right frontal lobe (images 20 and 21 of series 2), new compared to the prior examination from 02/17/2011, but nonspecific. No other potential acute intracranial abnormalities.  Specifically, no evidence of acute intracranial hemorrhage, no mass, mass effect, hydrocephalus or abnormal intra or extra-axial fluid collections. Large left mastoid effusion and trace right mastoid effusion. Multifocal mucosal thickening and opacification throughout the  ethmoid sinuses bilaterally, as well as some mucosal thickening in the maxillary sinuses bilaterally.  No acute displaced skull fractures are identified.  IMPRESSION: 1.  Ill-defined areas of low attenuation in the posterior right frontal deep white matter compared to prior study 02/17/2011.  This appearance could be compatible with chronic microvascular ischemic disease, however, an early area of acute or subacute ischemia could have a similar appearance.  If there is clinical concern for acute ischemia, further evaluation with brain MRI may provide additional diagnostic information. 2.  Large left mastoid and trace right mastoid effusions. 3.  Paranasal sinus disease, as above.  These results were called by telephone on 01/05/2013 at 11:30 a.m. to Dr. Adriana Simas, who verbally acknowledged these results.   Original Report Authenticated By: Trudie Reed, M.D.   Dg Chest Portable 1 View  01/05/2013   *RADIOLOGY REPORT*  Clinical Data: Altered mental status.  PORTABLE CHEST - 1 VIEW  Comparison: 02/16/2011  Findings: Lungs are hypoinflated but otherwise clear. Cardiomediastinal silhouette and remainder of the exam is unchanged.  IMPRESSION: Hypoinflation without acute cardiopulmonary disease.   Original Report Authenticated By: Elberta Fortis, M.D.      Assessment/Plan Active Problems:   Altered mental status   Alcoholism /alcohol abuse   Ataxia   Pernicious anemia   IVC thrombosis   Squamous cell carcinoma of tonsil   1. Altered mental status, multifactorial-dehydration, hypercalcemia, probably of malignancy and possible new CVA. 2. Dehydration. 3. Hypercalcemia, probably of malignancy. 4. Abnormal CT scan with possible new CVA that likely happened yesterday evening. 5. Throat cancer, status post radiotherapy. 6. Alcoholism.  Plan: 1. Admit to medical floor. 2. Intravenous fluids. 3. MRI brain scan. 4. Monitor electrolytes and calcium levels closely.  Further recommendations will depend on  patient's hospital progress.   Code Status: DO NOT RESUSCITATE. This was confirmed by the patient.  Family Communication: Discussed plan with patient at the bedside.   Disposition Plan: Home when medically stable.   Time spent: 60 minutes.  Wilson Singer [facility] Pager [phone]  If 7PM-7AM, please contact night-coverage [email or web address] Password [email or web address] 01/05/2013, 2:26 PM

## 2013-01-05 NOTE — ED Notes (Signed)
Patient arrives via EMS from home with altered mental status. Patient arousable, but drowsy, alert and confused. Speech slurred, pupils pinpoint but reactive. Throat cancer. Takes morphine at home, unknown amount of ingestion today. Fentanyl patch removed by EMS PTA. EMS reports patient is heavy drinker, has appeared to have about a fifth today already.

## 2013-01-05 NOTE — ED Notes (Signed)
Patient vomited prior to EMS arrival and while EMS had patient. Given 4 mg Zofran IV by EMS PTA. Patient with noted darkened vomit, appears bloody around teeth and mouth.

## 2013-01-05 NOTE — Progress Notes (Signed)
Per Central Telemetry, Pt is Vent Bigeminy. VSS. Asymptomatic. Paged MD.

## 2013-01-05 NOTE — ED Notes (Signed)
Report given to floor. Ready for patient. 

## 2013-01-05 NOTE — ED Notes (Signed)
Dr Adriana Simas informed of patient BP. Mental status unchanged, but patient is more awake. Verbal order for 500 ml Bolus NaCl with NaCl infusion at 200 ml/hr to follow.

## 2013-01-06 ENCOUNTER — Encounter (HOSPITAL_COMMUNITY): Payer: Self-pay | Admitting: Radiology

## 2013-01-06 ENCOUNTER — Inpatient Hospital Stay (HOSPITAL_COMMUNITY): Payer: Medicare HMO

## 2013-01-06 DIAGNOSIS — C099 Malignant neoplasm of tonsil, unspecified: Secondary | ICD-10-CM

## 2013-01-06 LAB — COMPREHENSIVE METABOLIC PANEL
Alkaline Phosphatase: 95 U/L (ref 39–117)
BUN: 24 mg/dL — ABNORMAL HIGH (ref 6–23)
CO2: 31 mEq/L (ref 19–32)
Chloride: 97 mEq/L (ref 96–112)
GFR calc Af Amer: 89 mL/min — ABNORMAL LOW (ref >90)
GFR calc non Af Amer: 76 mL/min — ABNORMAL LOW (ref >90)
Glucose, Bld: 107 mg/dL — ABNORMAL HIGH (ref 70–99)
Potassium: 4 mEq/L (ref 3.5–5.1)
Total Bilirubin: 0.2 mg/dL — ABNORMAL LOW (ref 0.3–1.2)

## 2013-01-06 LAB — BASIC METABOLIC PANEL
Chloride: 97 mEq/L (ref 96–112)
Creatinine, Ser: 1.27 mg/dL (ref 0.50–1.35)
GFR calc Af Amer: 66 mL/min — ABNORMAL LOW (ref >90)
GFR calc non Af Amer: 57 mL/min — ABNORMAL LOW (ref >90)
Potassium: 4.4 mEq/L (ref 3.5–5.1)

## 2013-01-06 LAB — CBC
MCHC: 33.2 g/dL (ref 30.0–36.0)
Platelets: 147 10*3/uL — ABNORMAL LOW (ref 150–400)
RDW: 13.6 % (ref 11.5–15.5)
WBC: 9.9 10*3/uL (ref 4.0–10.5)

## 2013-01-06 LAB — MAGNESIUM: Magnesium: 1.5 mg/dL (ref 1.5–2.5)

## 2013-01-06 NOTE — Progress Notes (Signed)
Pt and his brother, who helps to care for him verbalize understanding of d/c instructions. I explained importance of pt not consuming ETOH while on pain medications. Pt and brother have no questions at this time. IV was d/c without complication. Pt d/c via wheelchair, accompanied by NT and by pts brother who will be driving pt home and assisting him in home. Sheryn Bison

## 2013-01-06 NOTE — Discharge Summary (Signed)
Physician Discharge Summary  Zachary Fowler:[id number] DOB: 10/09/1945 DOA: 01/05/2013  PCP: Kirk Ruths, MD  Admit date: 01/05/2013 Discharge date: 01/06/2013  Time spent: Greater than 30 minutes  Recommendations for Outpatient Follow-up:  1. Followup with oncology as previously scheduled.   Discharge Diagnoses:  1. Altered mental status, multifactorial, resolved. MRI brain did not show any new CVA. 2. Dehydration, resolved. 3. Hypercalcemia, resolved. 4. Alcoholism. 5. Squamous cell cancer of the tonsil, status post radiotherapy.   Discharge Condition: Stable and improved.  Diet recommendation: PEG tube feeding as before.  Filed Weights   01/05/13 1446  Weight: 76.9 kg (169 lb 8.5 oz)    History of present illness:  This 67 year old man presents to the hospital with altered mental status. Please see initial history as outlined below: HPI: Zachary Fowler is a 67 y.o. male who presents with a 24-hour history of altered mental status. When he came to the emergency room, he was probably quite drowsy, now he has improved significantly, is able to give me a reasonable history. He tells me that yesterday evening his speech became more slurred. He has a history of alcoholism and he admits to drinking a pint of vodka even yesterday. He also has been taking opioids for neck pain. He normally takes fentanyl patch but 2-3 days ago he was also started on Roxanol in addition because of intractable pain. The patient has a history of throat cancer, squamous cell and has had radiotherapy for this.  Hospital Course:  Patient was admitted and started on intravenous fluids. He was dehydrated and hypercalcemic.. CT scan of the brain was suggestive of possible new CVA. He therefore underwent MRI brain scan. This did not show a new CVA but showed changes of cerebellar atrophy which was consistent with his neurological findings on examination. The cerebral atrophy is probably related to his  alcoholism. This morning he is much improved and his dehydration and hypercalcemia have resolved. His keen to go home. He also has been started on Roxanol which has probably contributed to his altered mental status recently. He apparently has been taking this almost every 2 hours. He will followup with oncology as previously scheduled.  Procedures:  None.  Consultations:  None.  Discharge Exam: Filed Vitals:   01/06/13 0602  BP: 104/58  Pulse: 92  Temp: 97.9 F (36.6 C)  Resp: 18    General: He looks systemically well. Cardiovascular: Heart sounds are present without murmurs or added sounds. Respiratory: Lung fields are clear. He is alert and orientated. He continues to have slurred speech as before. There are no new focal neurological signs.  Discharge Instructions  Discharge Orders   Future Appointments Provider Department Dept Phone   01/21/2013 10:00 AM [facility] [phone]   01/24/2013 11:00 AM [facility]ing Provider [facility] [phone]   Future Orders Complete By Expires   Diet - low sodium heart healthy  As directed    Increase activity slowly  As directed        Medication List    STOP taking these medications       fluconazole 100 MG tablet  Commonly known as:  DIFLUCAN      TAKE these medications       benzocaine 10 % mucosal gel  Commonly known as:  ORAJEL  Use as directed 1 application in the mouth or throat as needed for pain.     cyanocobalamin 1000 MCG/ML injection  Commonly known as:  (  VITAMIN B-12)  Inject 1,000 mcg into the muscle every 30 (thirty) days.     fentaNYL 50 MCG/HR  Commonly known as:  DURAGESIC - dosed mcg/hr  Place 1 patch (50 mcg total) onto the skin every 3 (three) days.     folic acid 1 MG tablet  Commonly known as:  FOLVITE  Take 1 tablet (1 mg total) by mouth 2 (two) times daily.     lidocaine 2 % solution  Commonly known as:  XYLOCAINE  Take 5 mLs by mouth every 3  (three) hours as needed for pain (Mouth and throat pain).     morphine 20 MG/ML concentrated solution  Commonly known as:  ROXANOL  Take 0.5 mLs (10 mg total) by mouth every 2 (two) hours as needed for pain.     ondansetron 8 MG disintegrating tablet  Commonly known as:  ZOFRAN ODT  Take 1 tablet (8 mg total) by mouth every 8 (eight) hours as needed for nausea.     potassium chloride 20 MEQ/15ML (10%) solution  Take 20 mEq by mouth 2 (two) times daily.     thiamine 100 MG tablet  Take 1 tablet (100 mg total) by mouth daily.       No Known Allergies    The results of significant diagnostics from this hospitalization (including imaging, microbiology, ancillary and laboratory) are listed below for reference.    Significant Diagnostic Studies: Ct Head Wo Contrast  01/05/2013   *RADIOLOGY REPORT*  Clinical Data: Altered mental status.  CT HEAD WITHOUT CONTRAST  Technique:  Contiguous axial images were obtained from the base of the skull through the vertex without contrast.  Comparison: Head CT 02/17/2011.  Findings: Mild cerebral and cerebellar atrophy.  A subtle area of decreased attenuation in the deep white matter of the posterior right frontal lobe (images 20 and 21 of series 2), new compared to the prior examination from 02/17/2011, but nonspecific. No other potential acute intracranial abnormalities.  Specifically, no evidence of acute intracranial hemorrhage, no mass, mass effect, hydrocephalus or abnormal intra or extra-axial fluid collections. Large left mastoid effusion and trace right mastoid effusion. Multifocal mucosal thickening and opacification throughout the ethmoid sinuses bilaterally, as well as some mucosal thickening in the maxillary sinuses bilaterally.  No acute displaced skull fractures are identified.  IMPRESSION: 1.  Ill-defined areas of low attenuation in the posterior right frontal deep white matter compared to prior study 02/17/2011.  This appearance could be  compatible with chronic microvascular ischemic disease, however, an early area of acute or subacute ischemia could have a similar appearance.  If there is clinical concern for acute ischemia, further evaluation with brain MRI may provide additional diagnostic information. 2.  Large left mastoid and trace right mastoid effusions. 3.  Paranasal sinus disease, as above.  These results were called by telephone on 01/05/2013 at 11:30 a.m. to Dr. Adriana Simas, who verbally acknowledged these results.   Original Report Authenticated By: Trudie Reed, M.D.   Ct Soft Tissue Neck W Contrast  12/13/2012   *RADIOLOGY REPORT*  Clinical Data:   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 Long history of alcohol use and tobacco abuse.  CT NECK WITH CONTRAST  Technique:  Multidetector CT imaging of the neck was performed with intravenous contrast.  Contrast: 75mL OMNIPAQUE IOHEXOL 300 MG/ML  SOLN  Comparison: 09/10/2012 CT neck.  09/20/2012 PET.  Findings: The patient has undergone chemotherapy for treatment of the primary lesion.  No concurrent surgery or chemotherapy.  I am unable to determine if radiotherapy has been completed based on the electronic medical record.  There is increased mass effect in the region of the left palatine tonsil with effacement of the left glossotonsillar sulcus, vallecula, and oropharynx.  The left parapharyngeal fat is effaced as compared with the prior exam of 09/10/2012. I am unable to discern a clearly enhancing lesion, and I suspect the findings are related to post XRT edema. Progression of tumor would be unlikely given the recent radiation.  Recommend follow-up PET scan for further evaluation.  The previously identified 13 x 14 mm level II node has decreased approximately 50% now measuring 8 mm in short axis.  Unchanged 8 x 10 mm left level IB submandibular node.  No other significant interval changes are identified.  Prior cervical fusion with spondylosis.  Stable carotid  atherosclerotic change.  Clear lung apices.  Normal larynx. A left premolar tooth has been removed.  IMPRESSION: Increased mass effect in the region of the left palatine tonsil in this patient recently undergoing radiation.  Ipsilateral level II adenopathy is decreased in size.  Suspect post XRT edema although progression of tumor not excluded.  Recommend follow-up repeat PET scan.   Original Report Authenticated By: Davonna Belling, M.D.   Mr Brain Wo Contrast  01/06/2013   *RADIOLOGY REPORT*  Clinical Data: Altered mental status.  MRI HEAD WITHOUT CONTRAST  Technique:  Multiplanar, multiecho pulse sequences of the brain and surrounding structures were obtained according to standard protocol without intravenous contrast.  Comparison: CT head without contrast 01/05/2013.  MRI brain without and with contrast 01/29/2008.  Findings: No acute infarct, hemorrhage, or mass lesion is present. A focal subcortical T2 hyperintensity in the posterior right frontal lobe is stable.  No hemorrhage or mass lesion is evident. Atrophy in the superior cerebellum is again noted.  A left mastoid effusion is evident.  No obstructing nasopharyngeal lesion is evident.  Circumferential mucosal thickening is present in the maxillary sinuses bilaterally.  There is scattered mucosal thickening throughout the ethmoid air cells as well.  IMPRESSION:  1.  No acute intracranial abnormality or significant interval change. 2.  Stable supratentorial atrophy and white matter disease. 3.  Cerebellar atrophy with some interval progression.  This is most commonly seen in the setting of chronic alcohol abuse or seizure therapy.   Original Report Authenticated By: Marin Roberts, M.D.   Dg Chest Portable 1 View  01/05/2013   *RADIOLOGY REPORT*  Clinical Data: Altered mental status.  PORTABLE CHEST - 1 VIEW  Comparison: 02/16/2011  Findings: Lungs are hypoinflated but otherwise clear. Cardiomediastinal silhouette and remainder of the exam is  unchanged.  IMPRESSION: Hypoinflation without acute cardiopulmonary disease.   Original Report Authenticated By: Elberta Fortis, M.D.        Labs: Basic Metabolic Panel:  Recent Labs Lab 01/05/13 1016 01/06/13 0004 01/06/13 0542  NA 134* 135 137  K 5.6* 4.4 4.0  CL 89* 97 97  CO2 31 30 31   GLUCOSE 137* 109* 107*  BUN 22 28* 24*  CREATININE 1.67* 1.27 1.00  CALCIUM 10.7* 9.5 9.8  MG  --  1.5  --    Liver Function Tests:  Recent Labs Lab 01/06/13 0542  AST 40*  ALT 20  ALKPHOS 95  BILITOT 0.2*  PROT 7.1  ALBUMIN 3.0*     CBC:  Recent Labs Lab 01/05/13 1016 01/06/13 0542  WBC 18.3* 9.9  NEUTROABS 16.3*  --   HGB 14.9 12.4*  HCT 44.5 37.4*  MCV 103.7* 103.0*  PLT PLATELET CLUMPS NOTED ON SMEAR, COUNT APPEARS ADEQUATE 147*        Signed:  GOSRANI,NIMISH C  [facility] 01/06/2013, 10:56 AM

## 2013-01-08 ENCOUNTER — Other Ambulatory Visit: Payer: Self-pay | Admitting: Radiation Oncology

## 2013-01-08 DIAGNOSIS — C099 Malignant neoplasm of tonsil, unspecified: Secondary | ICD-10-CM

## 2013-01-08 NOTE — Progress Notes (Signed)
UR Chart Review Completed  

## 2013-01-10 ENCOUNTER — Other Ambulatory Visit (HOSPITAL_COMMUNITY): Payer: Self-pay | Admitting: Oncology

## 2013-01-10 DIAGNOSIS — C099 Malignant neoplasm of tonsil, unspecified: Secondary | ICD-10-CM

## 2013-01-10 MED ORDER — POTASSIUM CHLORIDE 20 MEQ/15ML (10%) PO LIQD: 20.0000 meq | Freq: Two times a day (BID) | ORAL | Status: DC

## 2013-01-21 ENCOUNTER — Encounter (HOSPITAL_COMMUNITY): Payer: Medicare HMO | Attending: Oncology

## 2013-01-21 DIAGNOSIS — C099 Malignant neoplasm of tonsil, unspecified: Secondary | ICD-10-CM | POA: Insufficient documentation

## 2013-01-21 LAB — COMPREHENSIVE METABOLIC PANEL
ALT: 16 U/L (ref 0–53)
AST: 19 U/L (ref 0–37)
Albumin: 3.5 g/dL (ref 3.5–5.2)
Alkaline Phosphatase: 119 U/L — ABNORMAL HIGH (ref 39–117)
BUN: 13 mg/dL (ref 6–23)
Chloride: 96 mEq/L (ref 96–112)
Potassium: 3.6 mEq/L (ref 3.5–5.1)
Sodium: 137 mEq/L (ref 135–145)
Total Bilirubin: 0.4 mg/dL (ref 0.3–1.2)
Total Protein: 8.4 g/dL — ABNORMAL HIGH (ref 6.0–8.3)

## 2013-01-21 LAB — CBC WITH DIFFERENTIAL/PLATELET
Basophils Relative: 0 % (ref 0–1)
Eosinophils Absolute: 0.1 10*3/uL (ref 0.0–0.7)
Hemoglobin: 15.2 g/dL (ref 13.0–17.0)
MCHC: 32.9 g/dL (ref 30.0–36.0)
Monocytes Relative: 9 % (ref 3–12)
Neutro Abs: 6.7 10*3/uL (ref 1.7–7.7)
Neutrophils Relative %: 81 % — ABNORMAL HIGH (ref 43–77)
Platelets: 262 10*3/uL (ref 150–400)
RBC: 4.58 MIL/uL (ref 4.22–5.81)

## 2013-01-21 LAB — TSH: TSH: 3.315 u[IU]/mL (ref 0.350–4.500)

## 2013-01-21 NOTE — Progress Notes (Signed)
Labs drawn today for cbc/diff,cmp,tsh 

## 2013-01-24 ENCOUNTER — Encounter (HOSPITAL_BASED_OUTPATIENT_CLINIC_OR_DEPARTMENT_OTHER): Payer: Medicare HMO

## 2013-01-24 VITALS — BP 131/88 | HR 106 | Temp 98.4°F | Resp 20 | Wt 164.3 lb

## 2013-01-24 DIAGNOSIS — F1010 Alcohol abuse, uncomplicated: Secondary | ICD-10-CM

## 2013-01-24 DIAGNOSIS — D696 Thrombocytopenia, unspecified: Secondary | ICD-10-CM

## 2013-01-24 DIAGNOSIS — C099 Malignant neoplasm of tonsil, unspecified: Secondary | ICD-10-CM

## 2013-01-24 DIAGNOSIS — B370 Candidal stomatitis: Secondary | ICD-10-CM

## 2013-01-24 MED ORDER — FLUCONAZOLE 200 MG PO TABS: 200.0000 mg | ORAL_TABLET | Freq: Every day | ORAL | Status: DC

## 2013-01-24 MED ORDER — "INSULIN SYRINGE-NEEDLE U-100 28G X 1/2"" 0.3 ML MISC": 1.0000 [IU] | Freq: Once | Status: DC

## 2013-01-24 MED ORDER — FENTANYL 25 MCG/HR TD PT72: 1.0000 | MEDICATED_PATCH | TRANSDERMAL | Status: DC

## 2013-01-24 NOTE — Patient Instructions (Addendum)
[facility] Discharge Instructions  RECOMMENDATIONS MADE BY THE CONSULTANT AND ANY TEST RESULTS WILL BE SENT TO YOUR REFERRING PHYSICIAN.  EXAM FINDINGS BY THE PHYSICIAN TODAY AND SIGNS OR SYMPTOMS TO REPORT TO CLINIC OR PRIMARY PHYSICIAN:   Prescription given for Fentanyl (duragesic patch) 25mg . Apply 1 patch every 3 days. Rotate sites. Do not put patch on the same part of the skin. Move the patch around. Throw away old patch before applying new patch.  Do not use the Fentanyl 50mg  patches. Store them in a cool dry place.   Use the Roxanol for breakthrough pain. We have ordered you some 1ml syringes.   Return on October 7 @ 10:30 to see Dr. Celso Sickle will have 5 (1ml) syringes to pick up @ [facility].  Diflucan 200mg  tablets have been called into [place]. Take as per prescribed by Dr. Zigmund Daniel.      Thank you for choosing [facility] to provide your oncology and hematology care.  To afford each patient quality time with our providers, please arrive at least 15 minutes before your scheduled appointment time.  With your help, our goal is to use those 15 minutes to complete the necessary work-up to ensure our physicians have the information they need to help with your evaluation and healthcare recommendations.    Effective January 1st, 2014, we ask that you re-schedule your appointment with our physicians should you arrive 10 or more minutes late for your appointment.  We strive to give you quality time with our providers, and arriving late affects you and other patients whose appointments are after yours.    Again, thank you for choosing [facility].  Our hope is that these requests will decrease the amount of time that you wait before being seen by our physicians.       _____________________________________________________________  Should you have questions after your visit to [facility], please contact our office at [phone]  [phone] between the hours of 8:30 a.m. and 5:00 p.m.  Voicemails left after 4:30 p.m. will not be returned until the following business day.  For prescription refill requests, have your pharmacy contact our office with your prescription refill request.

## 2013-01-24 NOTE — Progress Notes (Signed)
[facility] OFFICE PROGRESS NOTE  Kirk Ruths, MD [address] [place] [address]  DIAGNOSIS: Squamous cell carcinoma of tonsil  Thrombocytopenia  Alcohol abuse  Chief Complaint  Patient presents with  . Follow-up    CURRENT THERAPY: Completed external beam radiotherapy approximately 6 weeks ago  INTERVAL HISTORY: Zachary Fowler 67 y.o. 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 Primary problem is neck pain and recurrent thrush. Unfortunately he overdosed on Roxanol was admitted briefly his brother has now taken upon himself the responsibility of making sure the patient takes his medication appropriately. He tolerated fentanyl 50 patches very poorly. He does have Roxanol drops at home, 20 mg/mL He denies a diarrhea, melena, hematochezia, hematuria, lower shortness swelling or redness, headache, or seizures. He continues to drink half a pint of vodka a day.   MEDICAL HISTORY: Past Medical History  Diagnosis Date  . Thrombocytopenia 02/17/2011  . Anemia 02/17/2011  . Hyponatremia 02/17/2011  . Hypomagnesemia 02/17/2011  . Hypophosphatemia 02/17/2011  . Subdural hematoma 02/17/2011  . Hypogonadism male 02/17/2011    Low testosterone level.  . Occasional tremors   . Cancer     tonsilar  . Anemia, pernicious   . Squamous cell carcinoma of tonsil 09/18/2012    INTERIM HISTORY: has Alcoholism /alcohol abuse; Hypokalemia; Ataxia; Falling episodes; Thrombocytopenia; Pernicious anemia; Hyponatremia; Hypomagnesemia; Hypophosphatemia; Subdural hematoma; Hypogonadism male; Alcoholic hepatitis; Folate deficiency; Functional gait disorder; UTI (urinary tract infection); IVC thrombosis; Squamous cell carcinoma of tonsil; Dysphagia, unspecified(787.20); and Altered mental status on his problem list.    ALLERGIES:  has No  Known Allergies.  MEDICATIONS: has a current medication list which includes the following prescription(s): benzocaine, cyanocobalamin, folic acid, potassium chloride, thiamine, fentanyl, fentanyl, fluconazole, insulin syringe-needle u-100, lidocaine, morphine, and ondansetron.  SURGICAL HISTORY:  Past Surgical History  Procedure Laterality Date  . Cervical disc surgery      X2  . Lumbar disc surgery      X2  . Cataract extraction, bilateral    . Insertion of vena cava filter    . Esophagogastroduodenoscopy (egd) with propofol N/A 09/25/2012    Procedure: ESOPHAGOGASTRODUODENOSCOPY (EGD) WITH PROPOFOL;  Surgeon: Malissa Hippo, MD;  Location: [facility];  Service: Endoscopy;  Laterality: N/A;  . Peg placement N/A 09/25/2012    Procedure: PERCUTANEOUS ENDOSCOPIC GASTROSTOMY (PEG) PLACEMENT;  Surgeon: Malissa Hippo, MD;  Location: [facility];  Service: Endoscopy;  Laterality: N/A;    FAMILY HISTORY: family history includes Cancer (age of onset: 30) in his mother; Diabetes in his daughter.  SOCIAL HISTORY:  reports that he has been smoking.  He has never used smokeless tobacco. He reports that  drinks alcohol. He reports that he does not use illicit drugs.  REVIEW OF SYSTEMS:  Other than that discussed above is noncontributory.  PHYSICAL EXAMINATION: ECOG PERFORMANCE STATUS: 2 - Symptomatic, <50% confined to bed  Blood pressure 131/88, pulse 106, temperature 98.4 F (36.9 C), temperature source Oral, resp. rate 20, weight 164 lb 4.8 oz (74.526 kg).  GENERAL:alert, no distress and comfortable SKIN: skin color, texture, turgor are normal, no rashes or significant lesions EYES: normal, Conjunctiva are pink and non-injected, sclera clear OROPHARYNX: Minimally able to open his mouth. Pinkish exudate expectorated from his mouth without gross red blood. NECK: supple, thyroid normal size, non-tender, fullness involving the left side as well as the anterior  portion of his neck without discrete  adenopathy appreciated.  CHEST: Increased AP diameter LYMPH:  no palpable lymphadenopathy in the cervical, axillary or inguinal LUNGS: clear to auscultation and percussion with normal breathing effort HEART: regular rate & rhythm and no murmurs and no lower extremity edema ABDOMEN:abdomen soft, non-tender and normal bowel sounds Musculoskeletal:no cyanosis of digits and no clubbing  NEURO: alert & oriented x 3 with fluent speech, no focal motor/sensory deficits   LABORATORY DATA: Infusion on 01/21/2013  Component Date Value Range Status  . WBC 01/21/2013 8.4  4.0 - 10.5 K/uL Final  . RBC 01/21/2013 4.58  4.22 - 5.81 MIL/uL Final  . Hemoglobin 01/21/2013 15.2  13.0 - 17.0 g/dL Final  . HCT 40/98/1191 46.2  39.0 - 52.0 % Final  . MCV 01/21/2013 100.9* 78.0 - 100.0 fL Final  . MCH 01/21/2013 33.2  26.0 - 34.0 pg Final  . MCHC 01/21/2013 32.9  30.0 - 36.0 g/dL Final  . RDW 47/82/9562 13.3  11.5 - 15.5 % Final  . Platelets 01/21/2013 262  150 - 400 K/uL Final  . Neutrophils Relative % 01/21/2013 81* 43 - 77 % Final  . Neutro Abs 01/21/2013 6.7  1.7 - 7.7 K/uL Final  . Lymphocytes Relative 01/21/2013 10* 12 - 46 % Final  . Lymphs Abs 01/21/2013 0.8  0.7 - 4.0 K/uL Final  . Monocytes Relative 01/21/2013 9  3 - 12 % Final  . Monocytes Absolute 01/21/2013 0.7  0.1 - 1.0 K/uL Final  . Eosinophils Relative 01/21/2013 1  0 - 5 % Final  . Eosinophils Absolute 01/21/2013 0.1  0.0 - 0.7 K/uL Final  . Basophils Relative 01/21/2013 0  0 - 1 % Final  . Basophils Absolute 01/21/2013 0.0  0.0 - 0.1 K/uL Final  . Sodium 01/21/2013 137  135 - 145 mEq/L Final  . Potassium 01/21/2013 3.6  3.5 - 5.1 mEq/L Final  . Chloride 01/21/2013 96  96 - 112 mEq/L Final  . CO2 01/21/2013 32  19 - 32 mEq/L Final  . Glucose, Bld 01/21/2013 128* 70 - 99 mg/dL Final  . BUN 13/11/6576 13  6 - 23 mg/dL Final  . Creatinine, Ser 01/21/2013 0.58  0.50 - 1.35 mg/dL Final  . Calcium 46/96/2952 10.8* 8.4 - 10.5 mg/dL Final   . Total Protein 01/21/2013 8.4* 6.0 - 8.3 g/dL Final  . Albumin 84/13/2440 3.5  3.5 - 5.2 g/dL Final  . AST 02/25/2535 19  0 - 37 U/L Final  . ALT 01/21/2013 16  0 - 53 U/L Final  . Alkaline Phosphatase 01/21/2013 119* 39 - 117 U/L Final  . Total Bilirubin 01/21/2013 0.4  0.3 - 1.2 mg/dL Final  . GFR calc non Af Amer 01/21/2013 >90  >90 mL/min Final  . GFR calc Af Amer 01/21/2013 >90  >90 mL/min Final   Comment: (NOTE)                          The eGFR has been calculated using the CKD EPI equation.                          This calculation has not been validated in all clinical situations.                          eGFR's persistently <90 mL/min signify possible Chronic Kidney  Disease.  Marland Kitchen TSH 01/21/2013 3.315  0.350 - 4.500 uIU/mL Corrected   Performed at [facility]  Admission on 01/05/2013, Discharged on 01/06/2013  Component Date Value Range Status  . WBC 01/05/2013 18.3* 4.0 - 10.5 K/uL Final  . RBC 01/05/2013 4.29  4.22 - 5.81 MIL/uL Final  . Hemoglobin 01/05/2013 14.9  13.0 - 17.0 g/dL Final  . HCT 14/78/2956 44.5  39.0 - 52.0 % Final  . MCV 01/05/2013 103.7* 78.0 - 100.0 fL Final  . MCH 01/05/2013 34.7* 26.0 - 34.0 pg Final  . MCHC 01/05/2013 33.5  30.0 - 36.0 g/dL Final  . RDW 21/30/8657 13.9  11.5 - 15.5 % Final  . Platelets 01/05/2013 PLATELET CLUMPS NOTED ON SMEAR, COUNT APPEARS ADEQUATE  150 - 400 K/uL Final  . Neutrophils Relative % 01/05/2013 89* 43 - 77 % Final  . Lymphocytes Relative 01/05/2013 2* 12 - 46 % Final  . Monocytes Relative 01/05/2013 9  3 - 12 % Final  . Eosinophils Relative 01/05/2013 0  0 - 5 % Final  . Basophils Relative 01/05/2013 0  0 - 1 % Final  . Neutro Abs 01/05/2013 16.3* 1.7 - 7.7 K/uL Final  . Lymphs Abs 01/05/2013 0.4* 0.7 - 4.0 K/uL Final  . Monocytes Absolute 01/05/2013 1.6* 0.1 - 1.0 K/uL Final  . Eosinophils Absolute 01/05/2013 0.0  0.0 - 0.7 K/uL Final  . Basophils Absolute 01/05/2013 0.0  0.0 - 0.1  K/uL Final  . RBC Morphology 01/05/2013 RARE NRBCs   Final   Comment: POLYCHROMASIA PRESENT                          BASOPHILIC STIPPLING  . WBC Morphology 01/05/2013 MILD LEFT SHIFT (1-5% METAS, OCC MYELO, OCC BANDS)   Final   Comment: ATYPICAL LYMPHOCYTES                          WHITE COUNT CONFIRMED ON SMEAR  . Smear Review 01/05/2013 PLATELET CLUMPS NOTED ON SMEAR, COUNT APPEARS ADEQUATE   Final   LARGE PLATELETS PRESENT  . Sodium 01/05/2013 134* 135 - 145 mEq/L Final  . Potassium 01/05/2013 5.6* 3.5 - 5.1 mEq/L Final  . Chloride 01/05/2013 89* 96 - 112 mEq/L Final  . CO2 01/05/2013 31  19 - 32 mEq/L Final  . Glucose, Bld 01/05/2013 137* 70 - 99 mg/dL Final  . BUN 84/69/6295 22  6 - 23 mg/dL Final  . Creatinine, Ser 01/05/2013 1.67* 0.50 - 1.35 mg/dL Final  . Calcium 28/41/3244 10.7* 8.4 - 10.5 mg/dL Final  . GFR calc non Af Amer 01/05/2013 41* >90 mL/min Final  . GFR calc Af Amer 01/05/2013 48* >90 mL/min Final   Comment: (NOTE)                          The eGFR has been calculated using the CKD EPI equation.                          This calculation has not been validated in all clinical situations.                          eGFR's persistently <90 mL/min signify possible Chronic Kidney  Disease.  Marland Kitchen Alcohol, Ethyl (B) 01/05/2013 <11  0 - 11 mg/dL Final   Comment:                                 LOWEST DETECTABLE LIMIT FOR                          SERUM ALCOHOL IS 11 mg/dL                          FOR MEDICAL PURPOSES ONLY  . Opiates 01/05/2013 POSITIVE* NONE DETECTED Final  . Cocaine 01/05/2013 NONE DETECTED  NONE DETECTED Final  . Benzodiazepines 01/05/2013 NONE DETECTED  NONE DETECTED Final  . Amphetamines 01/05/2013 NONE DETECTED  NONE DETECTED Final  . Tetrahydrocannabinol 01/05/2013 NONE DETECTED  NONE DETECTED Final  . Barbiturates 01/05/2013 NONE DETECTED  NONE DETECTED Final   Comment:                                 DRUG SCREEN FOR MEDICAL  PURPOSES                          ONLY.  IF CONFIRMATION IS NEEDED                          FOR ANY PURPOSE, NOTIFY LAB                          WITHIN 5 DAYS.                                                          LOWEST DETECTABLE LIMITS                          FOR URINE DRUG SCREEN                          Drug Class       Cutoff (ng/mL)                          Amphetamine      1000                          Barbiturate      200                          Benzodiazepine   200                          Tricyclics       300                          Opiates          300  Cocaine          300                          THC              50  . Color, Urine 01/05/2013 YELLOW  YELLOW Final  . APPearance 01/05/2013 CLEAR  CLEAR Final  . Specific Gravity, Urine 01/05/2013 >1.030* 1.005 - 1.030 Final  . pH 01/05/2013 6.0  5.0 - 8.0 Final  . Glucose, UA 01/05/2013 NEGATIVE  NEGATIVE mg/dL Final  . Hgb urine dipstick 01/05/2013 NEGATIVE  NEGATIVE Final  . Bilirubin Urine 01/05/2013 NEGATIVE  NEGATIVE Final  . Ketones, ur 01/05/2013 NEGATIVE  NEGATIVE mg/dL Final  . Protein, ur 14/78/2956 NEGATIVE  NEGATIVE mg/dL Final  . Urobilinogen, UA 01/05/2013 0.2  0.0 - 1.0 mg/dL Final  . Nitrite 21/30/8657 NEGATIVE  NEGATIVE Final  . Leukocytes, UA 01/05/2013 NEGATIVE  NEGATIVE Final   MICROSCOPIC NOT DONE ON URINES WITH NEGATIVE PROTEIN, BLOOD, LEUKOCYTES, NITRITE, OR GLUCOSE <1000 mg/dL.  Marland Kitchen Sodium 01/06/2013 137  135 - 145 mEq/L Final  . Potassium 01/06/2013 4.0  3.5 - 5.1 mEq/L Final  . Chloride 01/06/2013 97  96 - 112 mEq/L Final  . CO2 01/06/2013 31  19 - 32 mEq/L Final  . Glucose, Bld 01/06/2013 107* 70 - 99 mg/dL Final  . BUN 84/69/6295 24* 6 - 23 mg/dL Final  . Creatinine, Ser 01/06/2013 1.00  0.50 - 1.35 mg/dL Final  . Calcium 28/41/3244 9.8  8.4 - 10.5 mg/dL Final  . Total Protein 01/06/2013 7.1  6.0 - 8.3 g/dL Final  . Albumin 05/03/7251 3.0* 3.5 - 5.2 g/dL Final  .  AST 66/44/0347 40* 0 - 37 U/L Final  . ALT 01/06/2013 20  0 - 53 U/L Final  . Alkaline Phosphatase 01/06/2013 95  39 - 117 U/L Final  . Total Bilirubin 01/06/2013 0.2* 0.3 - 1.2 mg/dL Final  . GFR calc non Af Amer 01/06/2013 76* >90 mL/min Final  . GFR calc Af Amer 01/06/2013 89* >90 mL/min Final   Comment: (NOTE)                          The eGFR has been calculated using the CKD EPI equation.                          This calculation has not been validated in all clinical situations.                          eGFR's persistently <90 mL/min signify possible Chronic Kidney                          Disease.  . WBC 01/06/2013 9.9  4.0 - 10.5 K/uL Final  . RBC 01/06/2013 3.63* 4.22 - 5.81 MIL/uL Final  . Hemoglobin 01/06/2013 12.4* 13.0 - 17.0 g/dL Final   Comment: DELTA CHECK NOTED                          RESULT REPEATED AND VERIFIED  . HCT 01/06/2013 37.4* 39.0 - 52.0 % Final  . MCV 01/06/2013 103.0* 78.0 - 100.0 fL Final  . MCH 01/06/2013 34.2* 26.0 - 34.0 pg Final  . MCHC 01/06/2013 33.2  30.0 - 36.0  g/dL Final  . RDW 86/57/8469 13.6  11.5 - 15.5 % Final  . Platelets 01/06/2013 147* 150 - 400 K/uL Final  . Sodium 01/06/2013 135  135 - 145 mEq/L Final  . Potassium 01/06/2013 4.4  3.5 - 5.1 mEq/L Final   DELTA CHECK NOTED  . Chloride 01/06/2013 97  96 - 112 mEq/L Final   DELTA CHECK NOTED  . CO2 01/06/2013 30  19 - 32 mEq/L Final  . Glucose, Bld 01/06/2013 109* 70 - 99 mg/dL Final  . BUN 62/95/2841 28* 6 - 23 mg/dL Final  . Creatinine, Ser 01/06/2013 1.27  0.50 - 1.35 mg/dL Final  . Calcium 32/44/0102 9.5  8.4 - 10.5 mg/dL Final  . GFR calc non Af Amer 01/06/2013 57* >90 mL/min Final  . GFR calc Af Amer 01/06/2013 66* >90 mL/min Final   Comment: (NOTE)                          The eGFR has been calculated using the CKD EPI equation.                          This calculation has not been validated in all clinical situations.                          eGFR's persistently <90 mL/min  signify possible Chronic Kidney                          Disease.  . Magnesium 01/06/2013 1.5  1.5 - 2.5 mg/dL Final     Urinalysis    Component Value Date/Time   COLORURINE YELLOW 01/05/2013 1019   APPEARANCEUR CLEAR 01/05/2013 1019   LABSPEC >1.030* 01/05/2013 1019   PHURINE 6.0 01/05/2013 1019   GLUCOSEU NEGATIVE 01/05/2013 1019   HGBUR NEGATIVE 01/05/2013 1019   BILIRUBINUR NEGATIVE 01/05/2013 1019   KETONESUR NEGATIVE 01/05/2013 1019   PROTEINUR NEGATIVE 01/05/2013 1019   UROBILINOGEN 0.2 01/05/2013 1019   NITRITE NEGATIVE 01/05/2013 1019   LEUKOCYTESUR NEGATIVE 01/05/2013 1019    RADIOGRAPHIC STUDIES: Ct Head Wo Contrast  01/05/2013   *RADIOLOGY REPORT*  Clinical Data: Altered mental status.  CT HEAD WITHOUT CONTRAST  Technique:  Contiguous axial images were obtained from the base of the skull through the vertex without contrast.  Comparison: Head CT 02/17/2011.  Findings: Mild cerebral and cerebellar atrophy.  A subtle area of decreased attenuation in the deep white matter of the posterior right frontal lobe (images 20 and 21 of series 2), new compared to the prior examination from 02/17/2011, but nonspecific. No other potential acute intracranial abnormalities.  Specifically, no evidence of acute intracranial hemorrhage, no mass, mass effect, hydrocephalus or abnormal intra or extra-axial fluid collections. Large left mastoid effusion and trace right mastoid effusion. Multifocal mucosal thickening and opacification throughout the ethmoid sinuses bilaterally, as well as some mucosal thickening in the maxillary sinuses bilaterally.  No acute displaced skull fractures are identified.  IMPRESSION: 1.  Ill-defined areas of low attenuation in the posterior right frontal deep white matter compared to prior study 02/17/2011.  This appearance could be compatible with chronic microvascular ischemic disease, however, an early area of acute or subacute ischemia could have a similar appearance.  If there is clinical  concern for acute ischemia, further evaluation with brain MRI may provide additional diagnostic information. 2.  Large left mastoid  and trace right mastoid effusions. 3.  Paranasal sinus disease, as above.  These results were called by telephone on 01/05/2013 at 11:30 a.m. to Dr. Adriana Simas, who verbally acknowledged these results.   Original Report Authenticated By: Trudie Reed, M.D.   Mr Brain Wo Contrast  01/06/2013   *RADIOLOGY REPORT*  Clinical Data: Altered mental status.  MRI HEAD WITHOUT CONTRAST  Technique:  Multiplanar, multiecho pulse sequences of the brain and surrounding structures were obtained according to standard protocol without intravenous contrast.  Comparison: CT head without contrast 01/05/2013.  MRI brain without and with contrast 01/29/2008.  Findings: No acute infarct, hemorrhage, or mass lesion is present. A focal subcortical T2 hyperintensity in the posterior right frontal lobe is stable.  No hemorrhage or mass lesion is evident. Atrophy in the superior cerebellum is again noted.  A left mastoid effusion is evident.  No obstructing nasopharyngeal lesion is evident.  Circumferential mucosal thickening is present in the maxillary sinuses bilaterally.  There is scattered mucosal thickening throughout the ethmoid air cells as well.  IMPRESSION:  1.  No acute intracranial abnormality or significant interval change. 2.  Stable supratentorial atrophy and white matter disease. 3.  Cerebellar atrophy with some interval progression.  This is most commonly seen in the setting of chronic alcohol abuse or seizure therapy.   Original Report Authenticated By: Marin Roberts, M.D.   Dg Chest Portable 1 View  01/05/2013   *RADIOLOGY REPORT*  Clinical Data: Altered mental status.  PORTABLE CHEST - 1 VIEW  Comparison: 02/16/2011  Findings: Lungs are hypoinflated but otherwise clear. Cardiomediastinal silhouette and remainder of the exam is unchanged.  IMPRESSION: Hypoinflation without acute  cardiopulmonary disease.   Original Report Authenticated By: Elberta Fortis, M.D.    ASSESSMENT: #1. 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 #2 oral thrush. #3. Chronic obstructive pulmonary disease. #4. Chronic alcoholism.   PLAN: #1. PET/CT scan is scheduled for 01/30/2013. #2. Roxanol will be administered with an insulin syringe use a half cc every 4 hours as needed for breakthrough pain while the patient applies a fentanyl 25 patch every 3 days. Analgesic regimen will be monitored by his brother. #3. Diflucan 200 mg daily. #4. Followup on 02/03/2013 for discussion of PET scan results.   All questions were answered. The patient knows to call the clinic with any problems, questions or concerns. We can certainly see the patient much sooner if necessary.   I spent 30 minutes counseling the patient face to face. The total time spent in the appointment was 25 minutes.    Maurilio Lovely, MD 01/24/2013 12:16 PM

## 2013-01-31 ENCOUNTER — Encounter (HOSPITAL_COMMUNITY): Payer: Medicare HMO

## 2013-02-04 ENCOUNTER — Ambulatory Visit (HOSPITAL_COMMUNITY): Payer: Medicare HMO

## 2013-02-05 ENCOUNTER — Other Ambulatory Visit (HOSPITAL_COMMUNITY): Payer: Self-pay | Admitting: Oncology

## 2013-02-05 DIAGNOSIS — C099 Malignant neoplasm of tonsil, unspecified: Secondary | ICD-10-CM

## 2013-02-05 MED ORDER — FENTANYL 25 MCG/HR TD PT72: 1.0000 | MEDICATED_PATCH | TRANSDERMAL | Status: DC

## 2013-02-13 ENCOUNTER — Encounter (HOSPITAL_COMMUNITY)
Admission: RE | Admit: 2013-02-13 | Discharge: 2013-02-13 | Disposition: A | Discharge status: Home / Self Care | Payer: Medicare HMO | Source: Ambulatory Visit | Attending: Radiation Oncology | Admitting: Radiation Oncology

## 2013-02-13 DIAGNOSIS — C099 Malignant neoplasm of tonsil, unspecified: Secondary | ICD-10-CM | POA: Insufficient documentation

## 2013-02-13 MED ORDER — FLUDEOXYGLUCOSE F - 18 (FDG) INJECTION: 18.5000 | Freq: Once | INTRAVENOUS | Status: AC | PRN

## 2013-02-17 ENCOUNTER — Encounter (HOSPITAL_COMMUNITY): Payer: Medicare HMO | Attending: Oncology

## 2013-02-17 VITALS — BP 144/92 | HR 129 | Temp 97.4°F | Resp 20

## 2013-02-17 DIAGNOSIS — B351 Tinea unguium: Secondary | ICD-10-CM

## 2013-02-17 DIAGNOSIS — F1010 Alcohol abuse, uncomplicated: Secondary | ICD-10-CM

## 2013-02-17 DIAGNOSIS — R070 Pain in throat: Secondary | ICD-10-CM

## 2013-02-17 DIAGNOSIS — C099 Malignant neoplasm of tonsil, unspecified: Secondary | ICD-10-CM | POA: Insufficient documentation

## 2013-02-17 DIAGNOSIS — B370 Candidal stomatitis: Secondary | ICD-10-CM

## 2013-02-17 MED ORDER — FENTANYL 25 MCG/HR TD PT72: 1.0000 | MEDICATED_PATCH | TRANSDERMAL | Status: DC

## 2013-02-17 MED ORDER — LIDOCAINE VISCOUS 2 % MT SOLN: 5.0000 mL | OROMUCOSAL | Status: DC | PRN

## 2013-02-17 NOTE — Progress Notes (Signed)
[facility] OFFICE PROGRESS NOTE  Kirk Ruths, MD [address] [place] [address]  DIAGNOSIS: Squamous cell carcinoma of tonsil - Plan: lidocaine (XYLOCAINE) 2 % solution, CBC with Differential, Comprehensive metabolic panel, Lactate dehydrogenase  Alcohol abuse  Throat pain  Thrush - Plan: lidocaine (XYLOCAINE) 2 % solution  Onychomycosis of toenail  Chief Complaint  Patient presents with  . Cancer    tonsil    CURRENT THERAPY: Completed radiotherapy alone for her locally advanced carcinoma of the tonsil 10 weeks ago.  INTERVAL HISTORY: JAQUON GINGERICH 67 y.o. male returns for followup after PET/CT scan to assess response to radiotherapy alone for locally advanced carcinoma of the tonsil with treatment ending approximately 10 weeks ago.  Able to open his mouth more with less pain using only a fentanyl 25 patch now without any morphine oral liquids. He does use topical gels in the mouth which helped to relieve discomfort. He denies any fever, chills, abdominal pain, diarrhea, constipation, but still has significant ataxia. He denies any cough, wheezing, and has had no further expectoration of mucinous material or blood from his mouth since the discontinuation of radiation. He asked about whether or not chemotherapy will be given I told him at this time I do not think who is indicated. Previous evaluations by oncologist with seen him before me came to the conclusion that he would not tolerate it well and for that reason it was not recommended.  MEDICAL HISTORY: Past Medical History  Diagnosis Date  . Thrombocytopenia 02/17/2011  . Anemia 02/17/2011  . Hyponatremia 02/17/2011  . Hypomagnesemia 02/17/2011  . Hypophosphatemia 02/17/2011  . Subdural hematoma 02/17/2011  . Hypogonadism male 02/17/2011    Low testosterone level.  . Occasional tremors   . Cancer     tonsilar  . Anemia, pernicious   . Squamous cell carcinoma of  tonsil 09/18/2012    INTERIM HISTORY: has Alcoholism /alcohol abuse; Hypokalemia; Ataxia; Falling episodes; Thrombocytopenia; Pernicious anemia; Hyponatremia; Hypomagnesemia; Hypophosphatemia; Subdural hematoma; Hypogonadism male; Alcoholic hepatitis; Folate deficiency; Functional gait disorder; UTI (urinary tract infection); IVC thrombosis; Squamous cell carcinoma of tonsil; Dysphagia, unspecified(787.20); and Altered mental status on his problem list.    ALLERGIES:  has No Known Allergies.  MEDICATIONS: has a current medication list which includes the following prescription(s): benzocaine, cyanocobalamin, fentanyl, fentanyl, fentanyl, fluconazole, folic acid, insulin syringe-needle u-100, lidocaine, morphine, ondansetron, ondansetron, oxycodone, potassium chloride, and thiamine.  SURGICAL HISTORY:  Past Surgical History  Procedure Laterality Date  . Cervical disc surgery      X2  . Lumbar disc surgery      X2  . Cataract extraction, bilateral    . Insertion of vena cava filter    . Esophagogastroduodenoscopy (egd) with propofol N/A 09/25/2012    Procedure: ESOPHAGOGASTRODUODENOSCOPY (EGD) WITH PROPOFOL;  Surgeon: Malissa Hippo, MD;  Location: [facility];  Service: Endoscopy;  Laterality: N/A;  . Peg placement N/A 09/25/2012    Procedure: PERCUTANEOUS ENDOSCOPIC GASTROSTOMY (PEG) PLACEMENT;  Surgeon: Malissa Hippo, MD;  Location: [facility];  Service: Endoscopy;  Laterality: N/A;    FAMILY HISTORY: family history includes Cancer (age of onset: 34) in his mother; Diabetes in his daughter.  SOCIAL HISTORY:  reports that he has been smoking.  He has never used smokeless tobacco. He reports that he drinks alcohol. He reports that he does not use illicit drugs.  REVIEW OF SYSTEMS:  Other than that discussed above is noncontributory.  PHYSICAL EXAMINATION: ECOG  PERFORMANCE STATUS: 2 - Symptomatic, <50% confined to bed  Blood pressure 144/92, pulse 129, temperature 97.4 F (36.3 C), temperature  source Oral, resp. rate 20.  GENERAL:alert, no distress and comfortable SKIN: skin color, texture, turgor are normal, no rashes or significant lesions EYES: PERLA; Conjunctiva are pink and non-injected, sclera clear OROPHARYNX: Extremely poor oral hygiene with evidence of previous mass in the left tonsillar fossa. Followed her is appreciated. No active bleeding.Marland Kitchen NECK: supple, thyroid normal size, non-tender, without nodularity. No masses CHEST: Increased AP diameter with no gynecomastia. LYMPH:  no palpable lymphadenopathy in the cervical, axillary or inguinal LUNGS: clear to auscultation and percussion with normal breathing effort HEART: regular rate & rhythm and no murmurs and no lower extremity edema ABDOMEN:abdomen soft, non-tender and normal bowel sounds MUSCULOSKELETAL:no cyanosis of digits and no clubbing. Range of motion normal. Right foot with toenail destruction consistent with onychomycosis.  NEURO: Slurred speech. Ataxic gait. Positive Romberg sign.   LABORATORY DATA: Hospital Outpatient Visit on 02/13/2013  Component Date Value Range Status  . Glucose-Capillary 02/13/2013 119* 70 - 99 mg/dL Final  Infusion on 09/81/1914  Component Date Value Range Status  . WBC 01/21/2013 8.4  4.0 - 10.5 K/uL Final  . RBC 01/21/2013 4.58  4.22 - 5.81 MIL/uL Final  . Hemoglobin 01/21/2013 15.2  13.0 - 17.0 g/dL Final  . HCT 78/29/5621 46.2  39.0 - 52.0 % Final  . MCV 01/21/2013 100.9* 78.0 - 100.0 fL Final  . MCH 01/21/2013 33.2  26.0 - 34.0 pg Final  . MCHC 01/21/2013 32.9  30.0 - 36.0 g/dL Final  . RDW 30/86/5784 13.3  11.5 - 15.5 % Final  . Platelets 01/21/2013 262  150 - 400 K/uL Final  . Neutrophils Relative % 01/21/2013 81* 43 - 77 % Final  . Neutro Abs 01/21/2013 6.7  1.7 - 7.7 K/uL Final  . Lymphocytes Relative 01/21/2013 10* 12 - 46 % Final  . Lymphs Abs 01/21/2013 0.8  0.7 - 4.0 K/uL Final  . Monocytes Relative 01/21/2013 9  3 - 12 % Final  . Monocytes Absolute 01/21/2013  0.7  0.1 - 1.0 K/uL Final  . Eosinophils Relative 01/21/2013 1  0 - 5 % Final  . Eosinophils Absolute 01/21/2013 0.1  0.0 - 0.7 K/uL Final  . Basophils Relative 01/21/2013 0  0 - 1 % Final  . Basophils Absolute 01/21/2013 0.0  0.0 - 0.1 K/uL Final  . Sodium 01/21/2013 137  135 - 145 mEq/L Final  . Potassium 01/21/2013 3.6  3.5 - 5.1 mEq/L Final  . Chloride 01/21/2013 96  96 - 112 mEq/L Final  . CO2 01/21/2013 32  19 - 32 mEq/L Final  . Glucose, Bld 01/21/2013 128* 70 - 99 mg/dL Final  . BUN 69/62/9528 13  6 - 23 mg/dL Final  . Creatinine, Ser 01/21/2013 0.58  0.50 - 1.35 mg/dL Final  . Calcium 41/32/4401 10.8* 8.4 - 10.5 mg/dL Final  . Total Protein 01/21/2013 8.4* 6.0 - 8.3 g/dL Final  . Albumin 02/72/5366 3.5  3.5 - 5.2 g/dL Final  . AST 44/06/4740 19  0 - 37 U/L Final  . ALT 01/21/2013 16  0 - 53 U/L Final  . Alkaline Phosphatase 01/21/2013 119* 39 - 117 U/L Final  . Total Bilirubin 01/21/2013 0.4  0.3 - 1.2 mg/dL Final  . GFR calc non Af Amer 01/21/2013 >90  >90 mL/min Final  . GFR calc Af Amer 01/21/2013 >90  >90 mL/min Final   Comment: (NOTE)  The eGFR has been calculated using the CKD EPI equation.                          This calculation has not been validated in all clinical situations.                          eGFR's persistently <90 mL/min signify possible Chronic Kidney                          Disease.  Marland Kitchen TSH 01/21/2013 3.315  0.350 - 4.500 uIU/mL Corrected   Performed at [facility]    PATHOLOGY: None new  Urinalysis    Component Value Date/Time   COLORURINE YELLOW 01/05/2013 1019   APPEARANCEUR CLEAR 01/05/2013 1019   LABSPEC >1.030* 01/05/2013 1019   PHURINE 6.0 01/05/2013 1019   GLUCOSEU NEGATIVE 01/05/2013 1019   HGBUR NEGATIVE 01/05/2013 1019   BILIRUBINUR NEGATIVE 01/05/2013 1019   KETONESUR NEGATIVE 01/05/2013 1019   PROTEINUR NEGATIVE 01/05/2013 1019   UROBILINOGEN 0.2 01/05/2013 1019   NITRITE NEGATIVE 01/05/2013 1019   LEUKOCYTESUR  NEGATIVE 01/05/2013 1019    RADIOGRAPHIC STUDIES: Nm Pet Image Restag (ps) Skull Base To Thigh  02/13/2013   CLINICAL DATA:  Subsequent treatment strategy for tonsillar carcinoma.  EXAM: NUCLEAR MEDICINE PET SKULL BASE TO THIGH  FASTING BLOOD GLUCOSE:  Value:  119 mg/dl  TECHNIQUE: 45.4 mCi U-98 FDG was injected intravenously. CT data was obtained and used for attenuation correction and anatomic localization only. (This was not acquired as a diagnostic CT examination.) Additional exam technical data entered on technologist worksheet.  COMPARISON:  09/20/2012  FINDINGS: NECK  Hypermetabolic left tonsillar/ parapharyngeal mass is seen, but has a maximum SUV of 9.9 on today's exam compared with 14.8 previously. Previously seen left-sided level-II hypermetabolic adenopathy is no longer visualized. No hypermetabolic lymph nodes seen elsewhere within the neck.  CHEST  No hypermetabolic mediastinal or hilar nodes. No suspicious pulmonary nodules on the CT scan.  ABDOMEN/PELVIS  No abnormal hypermetabolic activity within the liver, pancreas, adrenal glands, or spleen. No hypermetabolic lymph nodes in the abdomen or pelvis.  SKELETON  No focal hypermetabolic activity to suggest skeletal metastasis.  IMPRESSION: Decrease in hypermetabolic activity associated with left tonsillar/ parapharyngeal mass.  Resolution of hypermetabolic left level 2 jugular chain lymphadenopathy. No other sites of hypermetabolic metastatic disease identified.   Electronically Signed   By: Myles Rosenthal M.D.   On: 02/13/2013 15:53    ASSESSMENT:  #1. 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 Still with residual oral pain controlled well with fentanyl patch 25 #2. Chronic alcohol abuse with significant ataxia, currently on thiamine. #3. Onychomycosis right foot.   PLAN:  #1. Continue fentanyl 25 patch every 3 days along with lidocaine liquid in the mouth. #2. Continue  Diflucan 200 mg daily. #3. Followup in 2 months   All questions were answered. The patient knows to call the clinic with any problems, questions or concerns. We can certainly see the patient much sooner if necessary.   I spent 25 minutes counseling the patient face to face. The total time spent in the appointment was 30 minutes.    Maurilio Lovely, MD 02/17/2013 12:32 PM

## 2013-02-17 NOTE — Patient Instructions (Signed)
[facility] Discharge Instructions  RECOMMENDATIONS MADE BY THE CONSULTANT AND ANY TEST RESULTS WILL BE SENT TO YOUR REFERRING PHYSICIAN.  EXAM FINDINGS BY THE PHYSICIAN TODAY AND SIGNS OR SYMPTOMS TO REPORT TO CLINIC OR PRIMARY PHYSICIAN: Exam and findings as discussed by Dr. Zigmund Daniel.  INSTRUCTIONS/FOLLOW-UP: 1.  Return in 2 months as scheduled.  Contact us sooner with questions or concerns.  Thank you for choosing [facility] to provide your oncology and hematology care.  To afford each patient quality time with our providers, please arrive at least 15 minutes before your scheduled appointment time.  With your help, our goal is to use those 15 minutes to complete the necessary work-up to ensure our physicians have the information they need to help with your evaluation and healthcare recommendations.    Effective January 1st, 2014, we ask that you re-schedule your appointment with our physicians should you arrive 10 or more minutes late for your appointment.  We strive to give you quality time with our providers, and arriving late affects you and other patients whose appointments are after yours.    Again, thank you for choosing [facility].  Our hope is that these requests will decrease the amount of time that you wait before being seen by our physicians.       _____________________________________________________________  Should you have questions after your visit to [facility], please contact our office at [phone] between the hours of 8:30 a.m. and 5:00 p.m.  Voicemails left after 4:30 p.m. will not be returned until the following business day.  For prescription refill requests, have your pharmacy contact our office with your prescription refill request.

## 2013-04-14 ENCOUNTER — Other Ambulatory Visit (HOSPITAL_COMMUNITY): Payer: Self-pay | Admitting: Oncology

## 2013-04-18 ENCOUNTER — Encounter (HOSPITAL_COMMUNITY): Payer: Medicare HMO | Attending: Oncology

## 2013-04-18 ENCOUNTER — Encounter (HOSPITAL_COMMUNITY): Payer: Self-pay

## 2013-04-18 VITALS — BP 136/82 | HR 122 | Temp 97.5°F | Resp 18 | Wt 169.5 lb

## 2013-04-18 DIAGNOSIS — K1230 Oral mucositis (ulcerative), unspecified: Secondary | ICD-10-CM | POA: Insufficient documentation

## 2013-04-18 DIAGNOSIS — K121 Other forms of stomatitis: Secondary | ICD-10-CM

## 2013-04-18 DIAGNOSIS — C099 Malignant neoplasm of tonsil, unspecified: Secondary | ICD-10-CM

## 2013-04-18 DIAGNOSIS — G119 Hereditary ataxia, unspecified: Secondary | ICD-10-CM

## 2013-04-18 DIAGNOSIS — F1020 Alcohol dependence, uncomplicated: Secondary | ICD-10-CM

## 2013-04-18 MED ORDER — NYSTATIN-TRIAMCINOLONE 100000-0.1 UNIT/GM-% EX OINT: TOPICAL_OINTMENT | CUTANEOUS | Status: AC

## 2013-04-18 MED ORDER — LIDOCAINE VISCOUS 2 % MT SOLN: 20.0000 mL | OROMUCOSAL | Status: DC | PRN

## 2013-04-18 NOTE — Progress Notes (Signed)
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 Tonsillar cancer    CURRENT THERAPY: Watchful expectation after definitive radiotherapy for carcinoma of the tonsil, no chemotherapy given.  INTERVAL HISTORY: Zachary Fowler 67 y.o. male returns for followup of tonsillar carcinoma treated with radiotherapy alone completed in August of 2014, stage IV. Because of poor comorbidities he no chemotherapy was given. He continues to suffer from oral mucositis and dry mouth. He denies any fever, night sweats, hemoptysis, epistaxis, abdominal pain, but does have loose bowel movements after PEG tube feedings. He is not able to take much nourishment by mouth at all because of dryness. He denies any lower extremity swelling or redness, cough, shortness of breath, or vomiting. He no longer uses any analgesics.   MEDICAL HISTORY: Past Medical History  Diagnosis Date  . Thrombocytopenia 02/17/2011  . Anemia 02/17/2011  . Hyponatremia 02/17/2011  . Hypomagnesemia 02/17/2011  . Hypophosphatemia 02/17/2011  . Subdural hematoma 02/17/2011  . Hypogonadism male 02/17/2011    Low testosterone level.  . Occasional tremors   . Cancer     tonsilar  . Anemia, pernicious   . Squamous cell carcinoma of tonsil 09/18/2012    INTERIM HISTORY: has Alcoholism /alcohol abuse; Hypokalemia; Ataxia; Falling episodes; Thrombocytopenia; Pernicious anemia; Hyponatremia; Hypomagnesemia; Hypophosphatemia; Subdural hematoma; Hypogonadism male; Alcoholic hepatitis; Folate deficiency; Functional gait disorder; UTI (urinary tract infection); IVC thrombosis; Squamous cell carcinoma of  tonsil; Dysphagia, unspecified(787.20); Altered mental status; and Oral mucositis on his problem list.    ALLERGIES:  has No Known Allergies.  MEDICATIONS: has a current medication list which includes the following prescription(s): benzocaine, cyanocobalamin, folic acid, insulin syringe-needle u-100, lidocaine, and nystatin-triamcinolone ointment.  SURGICAL HISTORY:  Past Surgical History  Procedure Laterality Date  . Cervical disc surgery      X2  . Lumbar disc surgery      X2  . Cataract extraction, bilateral    . Insertion of vena cava filter    . Esophagogastroduodenoscopy (egd) with propofol N/A 09/25/2012    Procedure: ESOPHAGOGASTRODUODENOSCOPY (EGD) WITH PROPOFOL;  Surgeon: Malissa Hippo, MD;  Location: [facility];  Service: Endoscopy;  Laterality: N/A;  . Peg placement N/A 09/25/2012    Procedure: PERCUTANEOUS ENDOSCOPIC GASTROSTOMY (PEG) PLACEMENT;  Surgeon: Malissa Hippo, MD;  Location: [facility];  Service: Endoscopy;  Laterality: N/A;    FAMILY HISTORY: family history includes Cancer (age of onset: 41) in his mother; Diabetes in his daughter.  SOCIAL HISTORY:  reports that he has been smoking.  He has never used smokeless tobacco. He reports that he drinks alcohol. He reports that he does not use illicit drugs.  REVIEW OF SYSTEMS:  Other than that discussed above is noncontributory.  PHYSICAL EXAMINATION: ECOG PERFORMANCE STATUS: 2 - Symptomatic, <50% confined to bed  Blood pressure 136/82, pulse 122, temperature 97.5 F (36.4 C), temperature source Oral, resp. rate 18, weight 169 lb 8 oz (76.885 kg).  GENERAL:alert, no distress and comfortable SKIN: skin color, texture, turgor are normal, no rashes or significant lesions EYES: PERLA; Conjunctiva are pink and non-injected, sclera clear OROPHARYNX: Severe xerostomia without evidence of plaques or vesicles. No oral bleeding.Marland Kitchen  NECK: supple, thyroid normal size, non-tender, without nodularity. No masses CHEST: Increased AP  diameter with no gynecomastia. LYMPH:  no palpable lymphadenopathy in the cervical, axillary or inguinal LUNGS: clear to auscultation and percussion with normal breathing effort HEART: regular rate & rhythm and no murmurs. ABDOMEN:abdomen soft, non-tender and normal bowel sounds. No free fluid wave or shifting dullness.. PEG tube stoma with surrounding erythema without hemorrhage. MUSCULOSKELETAL:no cyanosis of digits and no clubbing. Range of motion normal.  NEURO: alert & oriented x 3 with fluent speech, no focal motor/sensory deficits. Ataxia with positive Romberg sign.   LABORATORY DATA: No visits with results within 30 Day(s) from this visit. Latest known visit with results is:  Hospital Outpatient Visit on 02/13/2013  Component Date Value Range Status  . Glucose-Capillary 02/13/2013 119* 70 - 99 mg/dL Final    PATHOLOGY: No new pathology.  Urinalysis    Component Value Date/Time   COLORURINE YELLOW 01/05/2013 1019   APPEARANCEUR CLEAR 01/05/2013 1019   LABSPEC >1.030* 01/05/2013 1019   PHURINE 6.0 01/05/2013 1019   GLUCOSEU NEGATIVE 01/05/2013 1019   HGBUR NEGATIVE 01/05/2013 1019   BILIRUBINUR NEGATIVE 01/05/2013 1019   KETONESUR NEGATIVE 01/05/2013 1019   PROTEINUR NEGATIVE 01/05/2013 1019   UROBILINOGEN 0.2 01/05/2013 1019   NITRITE NEGATIVE 01/05/2013 1019   LEUKOCYTESUR NEGATIVE 01/05/2013 1019    RADIOGRAPHIC STUDIES: NM PET Image Restag (PS) Skull Base To Thigh Status: Final result         PACS Images    Show images for NM PET Image Restag (PS) Skull Base To Thigh         Study Result    CLINICAL DATA: Subsequent treatment strategy for tonsillar  carcinoma.  EXAM:  NUCLEAR MEDICINE PET SKULL BASE TO THIGH  FASTING BLOOD GLUCOSE: Value: 119 mg/dl  TECHNIQUE:  96.0 mCi A-54 FDG was injected intravenously. CT data was obtained  and used for attenuation correction and anatomic localization only.  (This was not acquired as a diagnostic CT examination.) Additional  exam  technical data entered on technologist worksheet.  COMPARISON: 09/20/2012  FINDINGS:  NECK  Hypermetabolic left tonsillar/ parapharyngeal mass is seen, but has  a maximum SUV of 9.9 on today's exam compared with 14.8 previously.  Previously seen left-sided level-II hypermetabolic adenopathy is no  longer visualized. No hypermetabolic lymph nodes seen elsewhere  within the neck.  CHEST  No hypermetabolic mediastinal or hilar nodes. No suspicious  pulmonary nodules on the CT scan.  ABDOMEN/PELVIS  No abnormal hypermetabolic activity within the liver, pancreas,  adrenal glands, or spleen. No hypermetabolic lymph nodes in the  abdomen or pelvis.  SKELETON  No focal hypermetabolic activity to suggest skeletal metastasis.  IMPRESSION:  Decrease in hypermetabolic activity associated with left tonsillar/  parapharyngeal mass.  Resolution of hypermetabolic left level 2 jugular chain  lymphadenopathy. No other sites of hypermetabolic metastatic disease  identified.  Electronically Signed  By: Myles Rosenthal      ASSESSMENT:  #1. 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 #2. Severe xerostomia. #3. Fungal dermatitis around the PEG tube stoma. #4. Chronic obstructive pulmonary disease. #5. Still drinking alcohol unfortunately. #6. Ataxia probably due to cerebellar dysfunction.   PLAN:  #1. Mycolog ointment to be applied to the skin around her stoma after cleansing with soap and water and careful drying. He was told to dust with cornstarch in the presence of this brother. #2. Followup with  ENT for repeat laryngoscopy. #3. Followup in 2 months at which time repeat PET scan will be ordered for March or April 2015. #4. He was encouraged to rinse with poor to frequency during the day and to purchase a water pick.   All questions were answered. The patient knows to call the clinic with any problems,  questions or concerns. We can certainly see the patient much sooner if necessary.   I spent 25 minutes counseling the patient face to face. The total time spent in the appointment was 30 minutes.    Maurilio Lovely, MD 04/18/2013 1:07 PM

## 2013-04-18 NOTE — Patient Instructions (Signed)
[facility] Discharge Instructions  RECOMMENDATIONS MADE BY THE CONSULTANT AND ANY TEST RESULTS WILL BE SENT TO YOUR REFERRING PHYSICIAN.  A prescription for Xylocaine (lidocaine) and Mycolog ointment was sent to your pharmacy. You will need to purchase Oragel over the counter. You need to keep your mouth rinsed out constantly. We will call Dr.Teoh's office to make a follow up appointment for you. Instructions for cleaning around your feeding tube: wash with antibacterial soap and water, allow to dry, apply mycolog ointment and a thin dusting of cornstarch. Report any issues/concerns to clinic as needed prior to appointments. Return to clinic in 2 months for follow up.  Thank you for choosing [facility] to provide your oncology and hematology care.  To afford each patient quality time with our providers, please arrive at least 15 minutes before your scheduled appointment time.  With your help, our goal is to use those 15 minutes to complete the necessary work-up to ensure our physicians have the information they need to help with your evaluation and healthcare recommendations.    Effective January 1st, 2014, we ask that you re-schedule your appointment with our physicians should you arrive 10 or more minutes late for your appointment.  We strive to give you quality time with our providers, and arriving late affects you and other patients whose appointments are after yours.    Again, thank you for choosing [facility].  Our hope is that these requests will decrease the amount of time that you wait before being seen by our physicians.       _____________________________________________________________  Should you have questions after your visit to [facility], please contact our office at [phone] between the hours of 8:30 a.m. and 5:00 p.m.  Voicemails left after 4:30 p.m. will not be returned until the following business day.  For  prescription refill requests, have your pharmacy contact our office with your prescription refill request.

## 2013-05-22 ENCOUNTER — Ambulatory Visit (INDEPENDENT_AMBULATORY_CARE_PROVIDER_SITE_OTHER): Payer: Medicare HMO | Admitting: Otolaryngology

## 2013-05-22 DIAGNOSIS — C090 Malignant neoplasm of tonsillar fossa: Secondary | ICD-10-CM

## 2013-06-03 ENCOUNTER — Encounter (HOSPITAL_COMMUNITY): Payer: Self-pay | Admitting: Oncology

## 2013-06-10 ENCOUNTER — Other Ambulatory Visit (HOSPITAL_COMMUNITY): Payer: Self-pay | Admitting: Oncology

## 2013-06-10 DIAGNOSIS — C099 Malignant neoplasm of tonsil, unspecified: Secondary | ICD-10-CM

## 2013-06-10 MED ORDER — LIDOCAINE VISCOUS 2 % MT SOLN: 20.0000 mL | OROMUCOSAL | Status: DC | PRN

## 2013-06-19 ENCOUNTER — Ambulatory Visit (HOSPITAL_COMMUNITY): Payer: Medicare HMO

## 2013-06-23 ENCOUNTER — Other Ambulatory Visit (HOSPITAL_COMMUNITY): Payer: Self-pay | Admitting: Oncology

## 2013-06-23 DIAGNOSIS — C099 Malignant neoplasm of tonsil, unspecified: Secondary | ICD-10-CM

## 2013-06-23 MED ORDER — LIDOCAINE VISCOUS 2 % MT SOLN: 20.0000 mL | OROMUCOSAL | Status: AC | PRN

## 2013-07-01 ENCOUNTER — Encounter (HOSPITAL_COMMUNITY): Payer: Self-pay

## 2013-07-01 ENCOUNTER — Encounter (HOSPITAL_COMMUNITY): Payer: Medicare HMO | Attending: Oncology

## 2013-07-01 VITALS — BP 112/80 | HR 130 | Temp 98.0°F | Resp 20 | Wt 158.6 lb

## 2013-07-01 DIAGNOSIS — J449 Chronic obstructive pulmonary disease, unspecified: Secondary | ICD-10-CM

## 2013-07-01 DIAGNOSIS — G119 Hereditary ataxia, unspecified: Secondary | ICD-10-CM

## 2013-07-01 DIAGNOSIS — K1230 Oral mucositis (ulcerative), unspecified: Secondary | ICD-10-CM

## 2013-07-01 DIAGNOSIS — C099 Malignant neoplasm of tonsil, unspecified: Secondary | ICD-10-CM | POA: Insufficient documentation

## 2013-07-01 DIAGNOSIS — K121 Other forms of stomatitis: Secondary | ICD-10-CM

## 2013-07-01 DIAGNOSIS — R6889 Other general symptoms and signs: Secondary | ICD-10-CM

## 2013-07-01 MED ORDER — MORPHINE SULFATE 20 MG/5ML PO SOLN: ORAL | Status: DC

## 2013-07-01 NOTE — Progress Notes (Signed)
[facility]  OFFICE PROGRESS NOTE  Zachary Grills, MD [address] [place] [address]  DIAGNOSIS: Squamous cell carcinoma of tonsil - Plan: IR Fluoro Guide CV Midline PICC Left  Oral mucositis  Chief Complaint  Patient presents with  . Carcinoma left tonsil    CURRENT THERAPY: Currently watchful expectation after completion of definitive radiotherapy in August 2014 for stage IV tonsillar carcinoma.  INTERVAL HISTORY: Zachary Fowler 68 y.o. male returns for followup of stage IV tonsillar carcinoma treated with radiotherapy alone, last treatment in August of 2014. He was seen by ENT and underwent fiberoptic laryngoscopy on 05/22/2013 at which time significant edema and erythema was noted within the left tonsillar fossa without any ulcerative mass seen along with evidence of post radiation mucositis and xerostomia with recommendations to continue Magic mouthwash therapy and to repeat PET scan in March or April. Since that visit he has noticed increasing subcutaneous nodules along the left and right side of the neck with worsening oral halitosis but without mouth pain. He denies any fever, night sweats, cough, wheezing, or expectoration of blood. He denies any lower extremity swelling or redness and continues to use his PEG tube in addition to attempts at oral ingestion which are impossible at this time.   MEDICAL HISTORY: Past Medical History  Diagnosis Date  . Thrombocytopenia 02/17/2011  . Anemia 02/17/2011  . Hyponatremia 02/17/2011  . Hypomagnesemia 02/17/2011  . Hypophosphatemia 02/17/2011  . Subdural hematoma 02/17/2011  . Hypogonadism male 02/17/2011    Low testosterone level.  . Occasional tremors   . Cancer     tonsilar  . Anemia, pernicious   . Squamous cell carcinoma of tonsil 09/18/2012    INTERIM HISTORY: has Alcoholism /alcohol abuse; Hypokalemia; Ataxia; Falling episodes; Thrombocytopenia;  Pernicious anemia; Hyponatremia; Hypomagnesemia; Hypophosphatemia; Subdural hematoma; Hypogonadism male; Alcoholic hepatitis; Folate deficiency; Functional gait disorder; UTI (urinary tract infection); IVC thrombosis; Squamous cell carcinoma of tonsil; Dysphagia, unspecified(787.20); Altered mental status; and Oral mucositis on his problem list.    ALLERGIES:  has No Known Allergies.  MEDICATIONS: has a current medication list which includes the following prescription(s): benzocaine, cyanocobalamin, folic acid, insulin syringe-needle u-100, lidocaine, nystatin-triamcinolone ointment, and morphine.  SURGICAL HISTORY:  Past Surgical History  Procedure Laterality Date  . Cervical disc surgery      X2  . Lumbar disc surgery      X2  . Cataract extraction, bilateral    . Insertion of vena cava filter    . Esophagogastroduodenoscopy (egd) with propofol N/A 09/25/2012    Procedure: ESOPHAGOGASTRODUODENOSCOPY (EGD) WITH PROPOFOL;  Surgeon: Rogene Houston, MD;  Location: [facility];  Service: Endoscopy;  Laterality: N/A;  . Peg placement N/A 09/25/2012    Procedure: PERCUTANEOUS ENDOSCOPIC GASTROSTOMY (PEG) PLACEMENT;  Surgeon: Rogene Houston, MD;  Location: [facility];  Service: Endoscopy;  Laterality: N/A;    FAMILY HISTORY: family history includes Cancer (age of onset: 80) in his mother; Diabetes in his daughter.  SOCIAL HISTORY:  reports that he has been smoking.  He has never used smokeless tobacco. He reports that he drinks alcohol. He reports that he does not use illicit drugs.  REVIEW OF SYSTEMS:  Other than that discussed above is noncontributory.  PHYSICAL EXAMINATION: ECOG PERFORMANCE STATUS: 2 - Symptomatic, <50% confined to bed  Blood pressure 112/80, pulse 130, temperature 98 F (36.7 C), temperature source Oral, resp. rate 20, weight 158 lb  9.6 oz (71.94 kg).  GENERAL: Alert and oriented but speaking with newly unintelligible phonation. SKIN: skin color, texture, turgor are normal, no  rashes or significant lesions EYES: PERLA; Conjunctiva are pink and non-injected, sclera clear OROPHARYNX:.Thick exudate with severe halitosis but no evidence of hemorrhage. NECK: Bilateral tumor masses involving the left side of the neck as well as the right with one area of ulceration  on the left with tenderness to palpation. Masses are fixed. CHEST: Increased AP diameter LYMPH:  no palpable lymphadenopathy in the cervical, axillary or inguinal LUNGS: clear to auscultation and percussion with normal breathing effort. No stridor. HEART: regular rate & rhythm and no murmurs. ABDOMEN:abdomen soft, non-tender and normal bowel sounds. No evidence of purulence or hemorrhage at the PEG tube stoma.  MUSCULOSKELETAL:no cyanosis of digits and no clubbing. Range of motion normal. Wasted musculature. NEURO: alert & oriented x 3 with fluent speech, positive Romberg sign   LABORATORY DATA: No visits with results within 30 Day(s) from this visit. Latest known visit with results is:  Hospital Outpatient Visit on 02/13/2013  Component Date Value Ref Range Status  . Glucose-Capillary 02/13/2013 119* 70 - 99 mg/dL Final    PATHOLOGY: No new pathology.  Urinalysis    Component Value Date/Time   COLORURINE YELLOW 01/05/2013 1019   APPEARANCEUR CLEAR 01/05/2013 1019   LABSPEC >1.030* 01/05/2013 1019   PHURINE 6.0 01/05/2013 1019   GLUCOSEU NEGATIVE 01/05/2013 1019   HGBUR NEGATIVE 01/05/2013 1019   BILIRUBINUR NEGATIVE 01/05/2013 1019   KETONESUR NEGATIVE 01/05/2013 1019   PROTEINUR NEGATIVE 01/05/2013 1019   UROBILINOGEN 0.2 01/05/2013 1019   NITRITE NEGATIVE 01/05/2013 1019   LEUKOCYTESUR NEGATIVE 01/05/2013 1019    RADIOGRAPHIC STUDIES: No results found.  ASSESSMENT:  #1. Progressive stage IV carcinoma of the tonsil with involvement of both sides of the neck with multiple subcutaneous nodules progressive since 05/22/2013. #2. Xerostomia with halitosis. #3.Chronic obstructive pulmonary disease. #4. Cerebellar  ataxia.   PLAN:  #1. In the presence of the patient and his brother, life expectancy with discussed. Without treatment, 6 months or less. With treatment, unknown. Recommended treatment with Taxol plus Erbitux weekly for at least 12 weeks. #2. The patient is willing to try therapy. #3. Arrangements were made for life port insertion through interventional radiology. #4. Will obtain third party approval and begin treatment as soon as possible. #5. Rx for morphine oral solution 20 mg per 5 mL to use 5 mL every 2-4 hours during the day and 10 mL at bedtime to control discomfort. #6. Prognosis guarded with high probability of erosion into a major neck blood vessel.   All questions were answered. The patient knows to call the clinic with any problems, questions or concerns. We can certainly see the patient much sooner if necessary.   I spent 25 minutes counseling the patient face to face. The total time spent in the appointment was 30 minutes.    Zachary Bradford, MD 07/01/2013 5:06 PM

## 2013-07-01 NOTE — Patient Instructions (Signed)
[facility] Discharge Instructions  RECOMMENDATIONS MADE BY THE CONSULTANT AND ANY TEST RESULTS WILL BE SENT TO YOUR REFERRING PHYSICIAN.  #1. In the presence of the patient and his brother, life expectancy with discussed. Without treatment, 6 months or less. With treatment, unknown. Recommended treatment with Taxol plus Erbitux weekly for at least 12 weeks.  #2. The patient is willing to try therapy.  #3. Arrangements were made for life port insertion through interventional radiology.  #4. Will obtain third party approval and begin treatment as soon as possible.  #5. Rx for morphine oral solution 20 mg per 5 mL to use 5 mL every 2-4 hours during the day and 10 mL at bedtime.     Thank you for choosing [facility] to provide your oncology and hematology care.  To afford each patient quality time with our providers, please arrive at least 15 minutes before your scheduled appointment time.  With your help, our goal is to use those 15 minutes to complete the necessary work-up to ensure our physicians have the information they need to help with your evaluation and healthcare recommendations.    Effective January 1st, 2014, we ask that you re-schedule your appointment with our physicians should you arrive 10 or more minutes late for your appointment.  We strive to give you quality time with our providers, and arriving late affects you and other patients whose appointments are after yours.    Again, thank you for choosing [facility].  Our hope is that these requests will decrease the amount of time that you wait before being seen by our physicians.       _____________________________________________________________  Should you have questions after your visit to [facility], please contact our office at [phone] between the hours of 8:30 a.m. and 5:00 p.m.  Voicemails left after 4:30 p.m. will not be returned until the following  business day.  For prescription refill requests, have your pharmacy contact our office with your prescription refill request.

## 2013-07-02 ENCOUNTER — Other Ambulatory Visit: Payer: Self-pay | Admitting: Radiology

## 2013-07-02 ENCOUNTER — Other Ambulatory Visit (HOSPITAL_COMMUNITY): Payer: Self-pay | Admitting: Hematology and Oncology

## 2013-07-03 ENCOUNTER — Telehealth (HOSPITAL_COMMUNITY): Payer: Self-pay | Admitting: *Deleted

## 2013-07-03 NOTE — Telephone Encounter (Signed)
[name] (Brother) called and said to cancel the appointment for the port placement and that his Brother did not want to take the Chemo after all.

## 2013-07-04 ENCOUNTER — Other Ambulatory Visit (HOSPITAL_COMMUNITY): Payer: Medicare HMO

## 2013-07-04 ENCOUNTER — Ambulatory Visit (HOSPITAL_COMMUNITY): Admission: RE | Admit: 2013-07-04 | Payer: Medicare HMO | Source: Ambulatory Visit

## 2013-07-04 ENCOUNTER — Other Ambulatory Visit (HOSPITAL_COMMUNITY): Payer: Self-pay | Admitting: Hematology and Oncology

## 2013-07-10 IMAGING — CT CT NECK W/ CM
3 of 4 series · 12 of 33 positions shown, 14 images · IV contrast (Omnipaque 300)
Comparison: None.

CLINICAL DATA: Neck mass.  Unspecified chronic disease of the
tonsils and adenoid.

CT NECK WITH CONTRAST
TECHNIQUE: Multidetector CT imaging of the neck was performed with
intravenous contrast.
Contrast: 75mL OMNIPAQUE IOHEXOL 300 MG/ML  SOLN

[Series 2: soft tissue neck 2.0 b31s · axial · 0.45mm/px · z∈[+1044,+1200]mm · 4 of 118 slices shown, 5 images]
[im 20/118  soft-tissue]
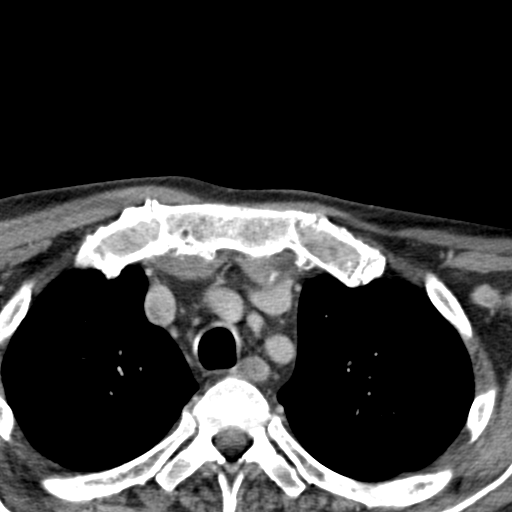
[im 20/118  bone]
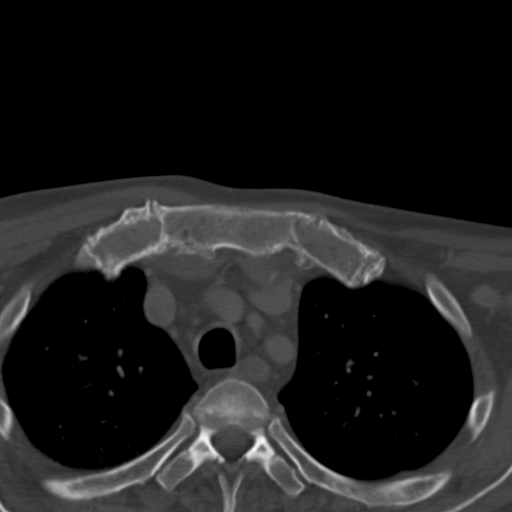
[im 40/118  bone]
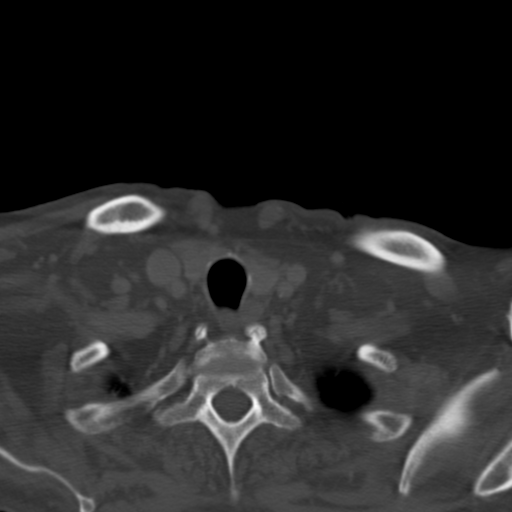
[im 79/118  bone]
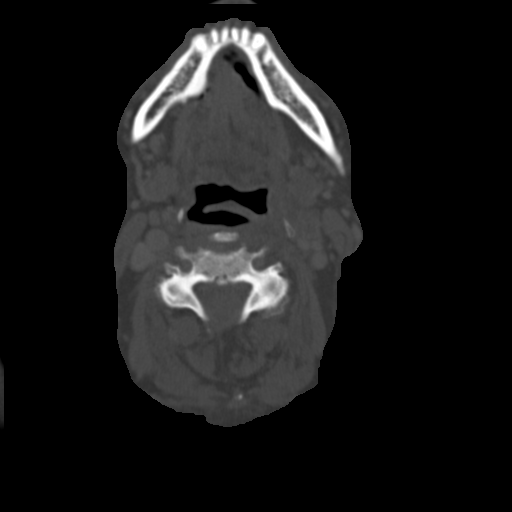
[im 98/118  bone]
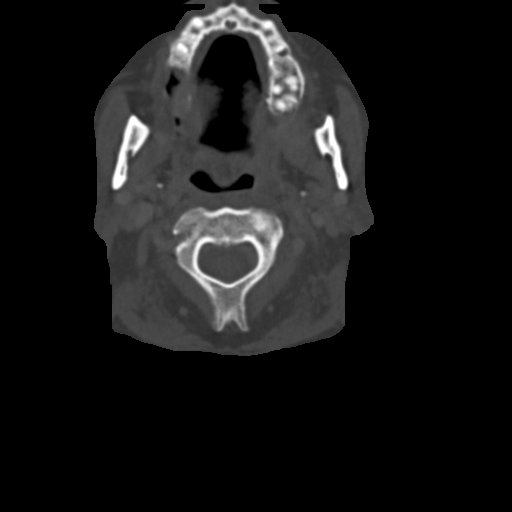

[Series 4: neck 2.0 soft tissue sag · sagittal · 0.36mm/px · 5 of 76 slices shown, 6 images]
[im 26/76  bone]
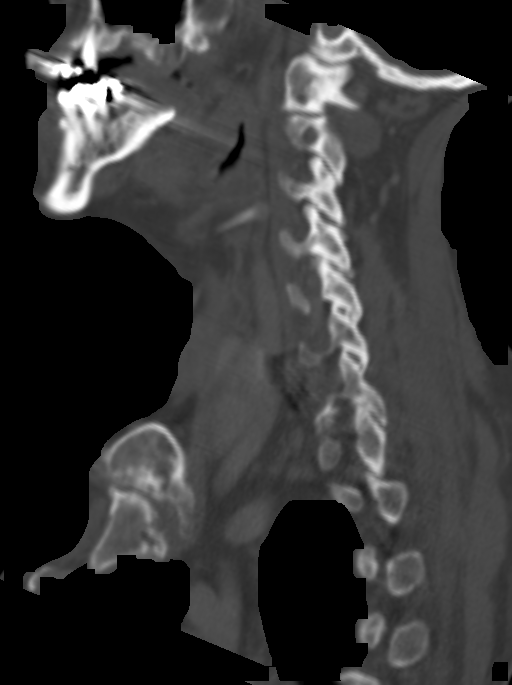
[im 32/76  bone]
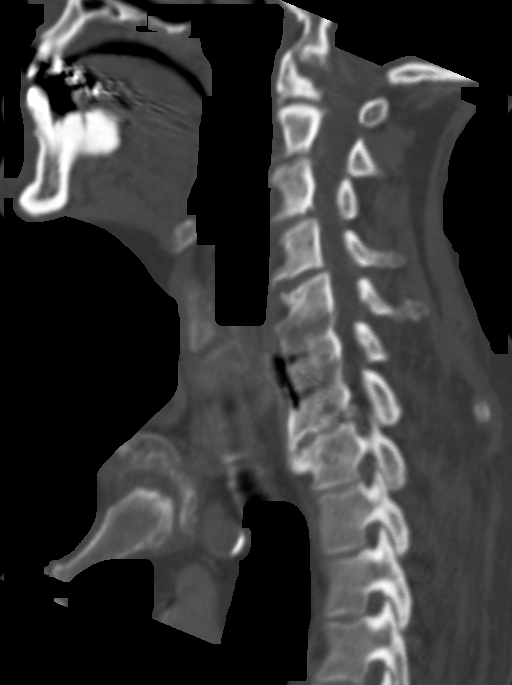
[im 38/76  soft-tissue]
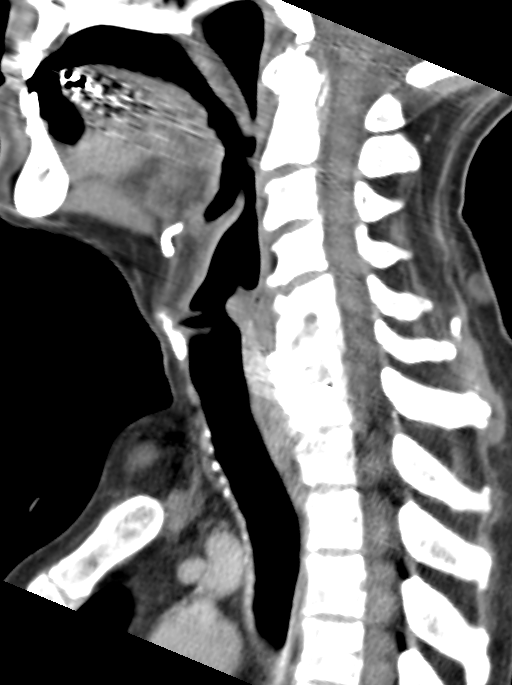
[im 38/76  bone]
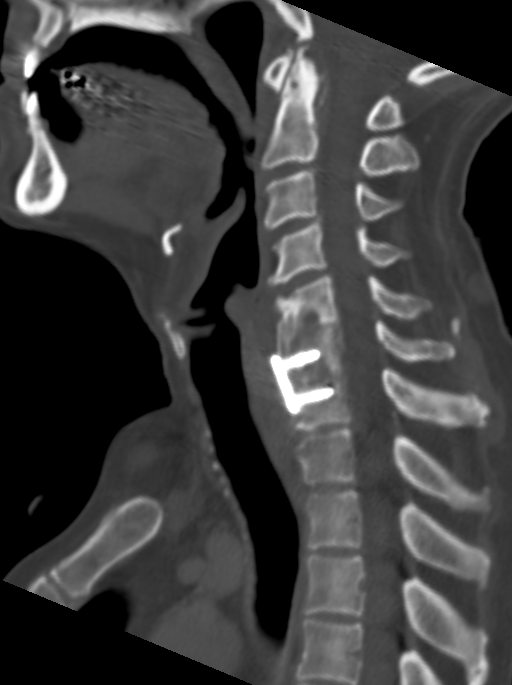
[im 44/76  bone]
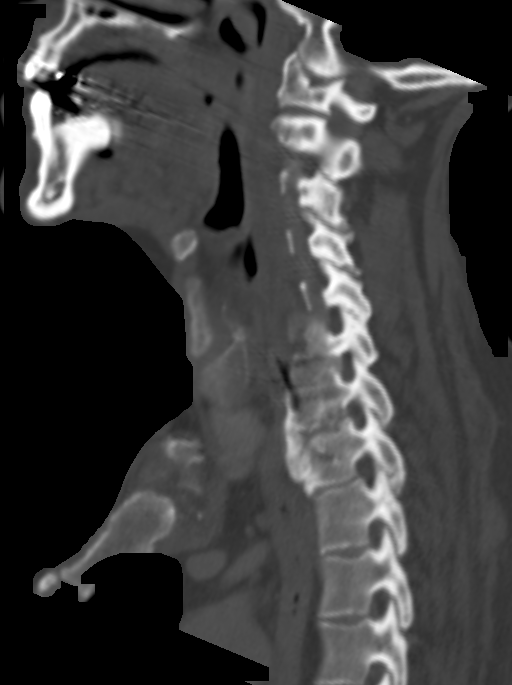
[im 51/76  bone]
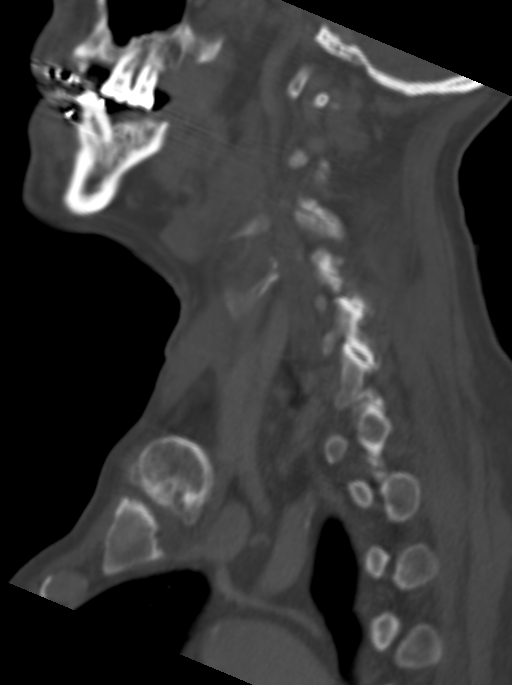

[Series 5: neck 2.0 soft tissue coro · coronal · 0.29mm/px · 3 of 75 slices shown]
[im 19/75  bone]
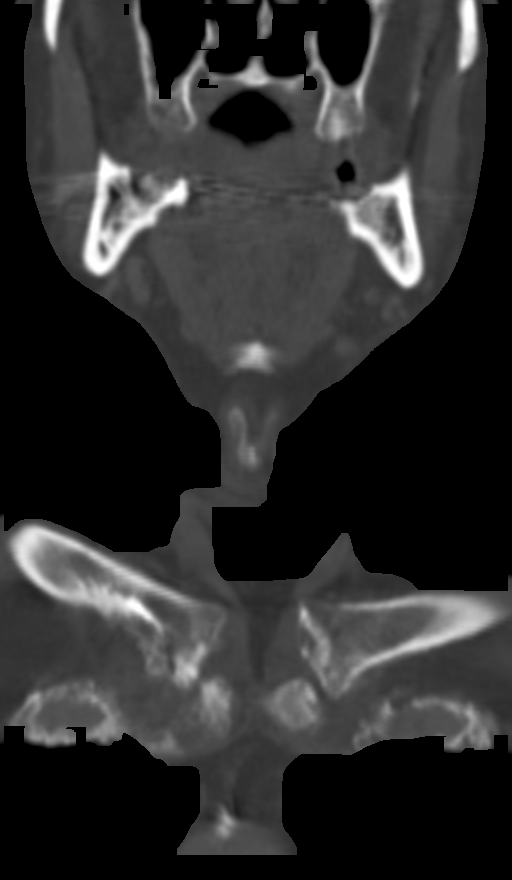
[im 31/75  bone]
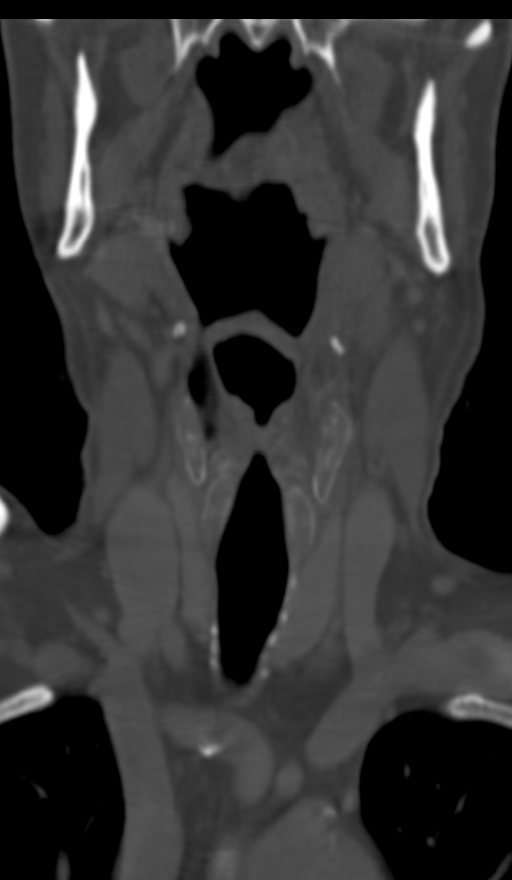
[im 44/75  bone]
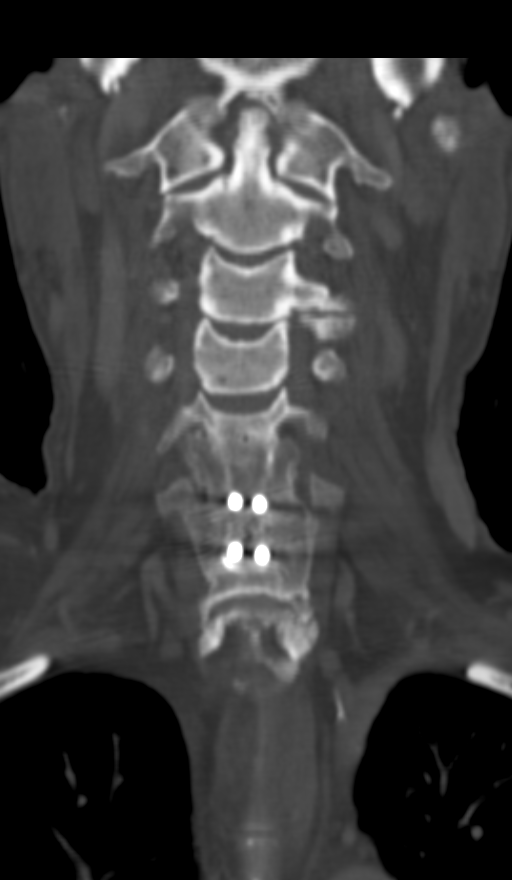

[12 of 33 positions shown; findings below may reference images not displayed]

FINDINGS: An irregular nodule is present in the left palatine
tonsil measures 13 x 18 x 16 mm.  There is a heterogeneously
enhancing adjacent left level II lymph node measuring 13 x 14 x 20
mm.  A more solid node is just superior to this, measuring 12 mm.
A 6 mm left level III node is rounded and may be metastatic.  A 12
x 10 mm left submandibular lymph node is also concerning for
metastases.

No other focal mucosal or submucosal lesion is present.  The vocal
cords are midline and symmetric.

The lung apices are clear.

Patient is status post anterior fusion of the cervical spine at C5-
6 and C6-7 there is ankylosis at C7-T1.  The hardware is intact.
No focal lytic or blastic lesions are present.
IMPRESSION: 1.  18 mm irregular nodule within the left palatine tonsil is
compatible with a focal neoplasm.
2.  Left submandibular, level II, and level III lymph nodes are
concerning for metastatic disease as described.
3.  Spondylosis of the cervical spine with previous anterior fusion
at C5-6 and C6-7.

## 2013-07-15 ENCOUNTER — Ambulatory Visit (HOSPITAL_COMMUNITY): Payer: Medicare HMO

## 2013-07-24 ENCOUNTER — Telehealth (HOSPITAL_COMMUNITY): Payer: Self-pay

## 2013-07-24 NOTE — Telephone Encounter (Signed)
Zachary Fowler on 07/24/2013 10:20 AM   Contacts       Type Contact Phone    07/24/2013 10:20 AM Phone (Incoming) [facility] (Provider) [phone]    Would like to discuss [facility] status    Spoke with [place] ,and let her know that the patient opted not to take chemotherapy and that there are no appointments scheduled for a return visit.  She will be seeing patient today and will broach the subject of follow-up appointment and possible hospice referral./ SMB

## 2013-07-30 ENCOUNTER — Emergency Department (HOSPITAL_COMMUNITY)

## 2013-07-30 ENCOUNTER — Inpatient Hospital Stay (HOSPITAL_COMMUNITY)
Admission: EM | Admit: 2013-07-30 | Discharge: 2013-07-30 | DRG: 640 | Disposition: A | Discharge status: Hospice | Attending: Internal Medicine | Admitting: Internal Medicine

## 2013-07-30 ENCOUNTER — Encounter (HOSPITAL_COMMUNITY): Payer: Self-pay | Admitting: Emergency Medicine

## 2013-07-30 DIAGNOSIS — Z833 Family history of diabetes mellitus: Secondary | ICD-10-CM

## 2013-07-30 DIAGNOSIS — R1310 Dysphagia, unspecified: Secondary | ICD-10-CM

## 2013-07-30 DIAGNOSIS — D72823 Leukemoid reaction: Secondary | ICD-10-CM | POA: Diagnosis present

## 2013-07-30 DIAGNOSIS — R0902 Hypoxemia: Secondary | ICD-10-CM

## 2013-07-30 DIAGNOSIS — E538 Deficiency of other specified B group vitamins: Secondary | ICD-10-CM

## 2013-07-30 DIAGNOSIS — E46 Unspecified protein-calorie malnutrition: Secondary | ICD-10-CM | POA: Diagnosis present

## 2013-07-30 DIAGNOSIS — G9340 Encephalopathy, unspecified: Secondary | ICD-10-CM

## 2013-07-30 DIAGNOSIS — Z515 Encounter for palliative care: Secondary | ICD-10-CM

## 2013-07-30 DIAGNOSIS — R296 Repeated falls: Secondary | ICD-10-CM

## 2013-07-30 DIAGNOSIS — E291 Testicular hypofunction: Secondary | ICD-10-CM

## 2013-07-30 DIAGNOSIS — R4182 Altered mental status, unspecified: Secondary | ICD-10-CM

## 2013-07-30 DIAGNOSIS — D510 Vitamin B12 deficiency anemia due to intrinsic factor deficiency: Secondary | ICD-10-CM

## 2013-07-30 DIAGNOSIS — N179 Acute kidney failure, unspecified: Secondary | ICD-10-CM

## 2013-07-30 DIAGNOSIS — E860 Dehydration: Secondary | ICD-10-CM | POA: Diagnosis present

## 2013-07-30 DIAGNOSIS — K7010 Alcoholic hepatitis without ascites: Secondary | ICD-10-CM

## 2013-07-30 DIAGNOSIS — F17200 Nicotine dependence, unspecified, uncomplicated: Secondary | ICD-10-CM | POA: Diagnosis present

## 2013-07-30 DIAGNOSIS — E876 Hypokalemia: Secondary | ICD-10-CM

## 2013-07-30 DIAGNOSIS — S065XAA Traumatic subdural hemorrhage with loss of consciousness status unknown, initial encounter: Secondary | ICD-10-CM

## 2013-07-30 DIAGNOSIS — E871 Hypo-osmolality and hyponatremia: Secondary | ICD-10-CM

## 2013-07-30 DIAGNOSIS — Z66 Do not resuscitate: Secondary | ICD-10-CM | POA: Diagnosis present

## 2013-07-30 DIAGNOSIS — C099 Malignant neoplasm of tonsil, unspecified: Secondary | ICD-10-CM

## 2013-07-30 DIAGNOSIS — N289 Disorder of kidney and ureter, unspecified: Secondary | ICD-10-CM

## 2013-07-30 DIAGNOSIS — C799 Secondary malignant neoplasm of unspecified site: Secondary | ICD-10-CM

## 2013-07-30 DIAGNOSIS — R7989 Other specified abnormal findings of blood chemistry: Secondary | ICD-10-CM

## 2013-07-30 DIAGNOSIS — G9341 Metabolic encephalopathy: Secondary | ICD-10-CM | POA: Diagnosis present

## 2013-07-30 DIAGNOSIS — S065X9A Traumatic subdural hemorrhage with loss of consciousness of unspecified duration, initial encounter: Secondary | ICD-10-CM

## 2013-07-30 DIAGNOSIS — E873 Alkalosis: Secondary | ICD-10-CM

## 2013-07-30 DIAGNOSIS — K1230 Oral mucositis (ulcerative), unspecified: Secondary | ICD-10-CM

## 2013-07-30 DIAGNOSIS — N390 Urinary tract infection, site not specified: Secondary | ICD-10-CM

## 2013-07-30 DIAGNOSIS — Z7401 Bed confinement status: Secondary | ICD-10-CM

## 2013-07-30 DIAGNOSIS — I82220 Acute embolism and thrombosis of inferior vena cava: Secondary | ICD-10-CM

## 2013-07-30 DIAGNOSIS — R269 Unspecified abnormalities of gait and mobility: Secondary | ICD-10-CM

## 2013-07-30 DIAGNOSIS — I959 Hypotension, unspecified: Secondary | ICD-10-CM

## 2013-07-30 DIAGNOSIS — D72829 Elevated white blood cell count, unspecified: Secondary | ICD-10-CM

## 2013-07-30 DIAGNOSIS — F1020 Alcohol dependence, uncomplicated: Secondary | ICD-10-CM

## 2013-07-30 DIAGNOSIS — R270 Ataxia, unspecified: Secondary | ICD-10-CM

## 2013-07-30 DIAGNOSIS — Z800 Family history of malignant neoplasm of digestive organs: Secondary | ICD-10-CM

## 2013-07-30 DIAGNOSIS — IMO0001 Reserved for inherently not codable concepts without codable children: Secondary | ICD-10-CM

## 2013-07-30 DIAGNOSIS — D696 Thrombocytopenia, unspecified: Secondary | ICD-10-CM

## 2013-07-30 LAB — CBC WITH DIFFERENTIAL/PLATELET
Basophils Absolute: 0.1 10*3/uL (ref 0.0–0.1)
Basophils Relative: 0 % (ref 0–1)
EOS ABS: 0 10*3/uL (ref 0.0–0.7)
Eosinophils Relative: 0 % (ref 0–5)
HEMATOCRIT: 47.1 % (ref 39.0–52.0)
Hemoglobin: 16.1 g/dL (ref 13.0–17.0)
LYMPHS ABS: 0.9 10*3/uL (ref 0.7–4.0)
LYMPHS PCT: 2 % — AB (ref 12–46)
MCH: 35.3 pg — AB (ref 26.0–34.0)
MCHC: 34.2 g/dL (ref 30.0–36.0)
MCV: 103.3 fL — AB (ref 78.0–100.0)
MONO ABS: 2 10*3/uL — AB (ref 0.1–1.0)
Monocytes Relative: 4 % (ref 3–12)
Neutro Abs: 44.7 10*3/uL — ABNORMAL HIGH (ref 1.7–7.7)
Neutrophils Relative %: 94 % — ABNORMAL HIGH (ref 43–77)
PLATELETS: 261 10*3/uL (ref 150–400)
RBC: 4.56 MIL/uL (ref 4.22–5.81)
RDW: 12.4 % (ref 11.5–15.5)
WBC Morphology: INCREASED
WBC: 47.6 10*3/uL — AB (ref 4.0–10.5)

## 2013-07-30 LAB — COMPREHENSIVE METABOLIC PANEL
ALT: 14 U/L (ref 0–53)
AST: 30 U/L (ref 0–37)
Albumin: 2.9 g/dL — ABNORMAL LOW (ref 3.5–5.2)
Alkaline Phosphatase: 105 U/L (ref 39–117)
BILIRUBIN TOTAL: 1.4 mg/dL — AB (ref 0.3–1.2)
BUN: 40 mg/dL — AB (ref 6–23)
CHLORIDE: 95 meq/L — AB (ref 96–112)
CO2: 39 meq/L — AB (ref 19–32)
Creatinine, Ser: 2.6 mg/dL — ABNORMAL HIGH (ref 0.50–1.35)
GFR calc non Af Amer: 24 mL/min — ABNORMAL LOW (ref >90)
GFR, EST AFRICAN AMERICAN: 28 mL/min — AB (ref >90)
GLUCOSE: 120 mg/dL — AB (ref 70–99)
Potassium: 2.3 mEq/L — CL (ref 3.7–5.3)
Sodium: 147 mEq/L (ref 137–147)
Total Protein: 7.6 g/dL (ref 6.0–8.3)

## 2013-07-30 LAB — BASIC METABOLIC PANEL
BUN: 40 mg/dL — ABNORMAL HIGH (ref 6–23)
CO2: 38 meq/L — AB (ref 19–32)
Calcium: 14.7 mg/dL (ref 8.4–10.5)
Chloride: 102 mEq/L (ref 96–112)
Creatinine, Ser: 2.53 mg/dL — ABNORMAL HIGH (ref 0.50–1.35)
GFR calc Af Amer: 29 mL/min — ABNORMAL LOW (ref >90)
GFR calc non Af Amer: 25 mL/min — ABNORMAL LOW (ref >90)
GLUCOSE: 142 mg/dL — AB (ref 70–99)
POTASSIUM: 3.3 meq/L — AB (ref 3.7–5.3)
SODIUM: 147 meq/L (ref 137–147)

## 2013-07-30 LAB — GLUCOSE, CAPILLARY
Glucose-Capillary: 152 mg/dL — ABNORMAL HIGH (ref 70–99)
Glucose-Capillary: 126 mg/dL — ABNORMAL HIGH (ref 70–99)

## 2013-07-30 LAB — URINALYSIS, ROUTINE W REFLEX MICROSCOPIC
Bilirubin Urine: NEGATIVE
Glucose, UA: NEGATIVE mg/dL
HGB URINE DIPSTICK: NEGATIVE
Ketones, ur: NEGATIVE mg/dL
LEUKOCYTES UA: NEGATIVE
NITRITE: NEGATIVE
PROTEIN: NEGATIVE mg/dL
SPECIFIC GRAVITY, URINE: 1.01 (ref 1.005–1.030)
UROBILINOGEN UA: 0.2 mg/dL (ref 0.0–1.0)
pH: 6.5 (ref 5.0–8.0)

## 2013-07-30 LAB — MRSA PCR SCREENING: MRSA by PCR: NEGATIVE

## 2013-07-30 LAB — APTT: aPTT: 26 seconds (ref 24–37)

## 2013-07-30 LAB — PROTIME-INR
INR: 1.02 (ref 0.00–1.49)
PROTHROMBIN TIME: 13.2 s (ref 11.6–15.2)

## 2013-07-30 LAB — LACTIC ACID, PLASMA: LACTIC ACID, VENOUS: 4.9 mmol/L — AB (ref 0.5–2.2)

## 2013-07-30 MED ORDER — DEXTROSE 5 % IV SOLN
1.0000 g | INTRAVENOUS | Status: DC
  Administered 2013-07-30: 1 g via INTRAVENOUS
  Filled 2013-07-30 (×2): qty 1

## 2013-07-30 MED ORDER — POTASSIUM CHLORIDE 10 MEQ/100ML IV SOLN
10.0000 meq | Freq: Once | INTRAVENOUS | Status: AC
  Administered 2013-07-30: 10 meq via INTRAVENOUS
  Filled 2013-07-30: qty 100

## 2013-07-30 MED ORDER — POTASSIUM CHLORIDE 10 MEQ/100ML IV SOLN
INTRAVENOUS | Status: AC
  Administered 2013-07-30: 10 meq via INTRAVENOUS
  Filled 2013-07-30: qty 100

## 2013-07-30 MED ORDER — VANCOMYCIN HCL IN DEXTROSE 750-5 MG/150ML-% IV SOLN
750.0000 mg | INTRAVENOUS | Status: DC
  Filled 2013-07-30: qty 150

## 2013-07-30 MED ORDER — CALCITONIN (SALMON) 200 UNIT/ML IJ SOLN
200.0000 [IU] | Freq: Once | INTRAMUSCULAR | Status: AC
  Administered 2013-07-30: 200 [IU] via SUBCUTANEOUS
  Filled 2013-07-30: qty 1

## 2013-07-30 MED ORDER — ONDANSETRON HCL 4 MG/2ML IJ SOLN
4.0000 mg | Freq: Four times a day (QID) | INTRAMUSCULAR | Status: DC | PRN
  Administered 2013-07-30: 4 mg via INTRAVENOUS
  Filled 2013-07-30: qty 2

## 2013-07-30 MED ORDER — SODIUM CHLORIDE 0.9 % IV SOLN
INTRAVENOUS | Status: DC
  Administered 2013-07-30: 20 mL via INTRAVENOUS

## 2013-07-30 MED ORDER — ONDANSETRON HCL 4 MG PO TABS: 4.0000 mg | ORAL_TABLET | Freq: Four times a day (QID) | ORAL | Status: DC | PRN

## 2013-07-30 MED ORDER — MORPHINE SULFATE (CONCENTRATE) 10 MG /0.5 ML PO SOLN
20.0000 mg | ORAL | Status: DC | PRN
  Administered 2013-07-30 (×3): 20 mg
  Filled 2013-07-30 (×3): qty 1

## 2013-07-30 MED ORDER — PAMIDRONATE DISODIUM 30 MG/10ML IV SOLN
60.0000 mg | Freq: Once | INTRAVENOUS | Status: AC
  Administered 2013-07-30: 60 mg via INTRAVENOUS
  Filled 2013-07-30: qty 20

## 2013-07-30 MED ORDER — MORPHINE SULFATE 20 MG/5ML PO SOLN: 5.0000 mg | ORAL | Status: AC | PRN

## 2013-07-30 MED ORDER — SODIUM CHLORIDE 0.9 % IV SOLN
1000.0000 mL | Freq: Once | INTRAVENOUS | Status: AC
  Administered 2013-07-30: 1000 mL via INTRAVENOUS

## 2013-07-30 MED ORDER — CYANOCOBALAMIN 1000 MCG/ML IJ SOLN
1000.0000 ug | INTRAMUSCULAR | Status: DC
  Administered 2013-07-30: 1000 ug via INTRAMUSCULAR
  Filled 2013-07-30: qty 1

## 2013-07-30 MED ORDER — CEFAZOLIN SODIUM-DEXTROSE 2-3 GM-% IV SOLR
2.0000 g | INTRAVENOUS | Status: AC
  Administered 2013-07-30: 2 g via INTRAVENOUS

## 2013-07-30 MED ORDER — POTASSIUM CHLORIDE 20 MEQ/15ML (10%) PO LIQD
40.0000 meq | Freq: Every day | ORAL | Status: DC
  Administered 2013-07-30: 40 meq
  Filled 2013-07-30: qty 30

## 2013-07-30 MED ORDER — SODIUM CHLORIDE 0.9 % IV SOLN
INTRAVENOUS | Status: DC
  Administered 2013-07-30: 08:00:00 via INTRAVENOUS

## 2013-07-30 MED ORDER — FOLIC ACID 1 MG PO TABS
1.0000 mg | ORAL_TABLET | Freq: Two times a day (BID) | ORAL | Status: DC
  Administered 2013-07-30: 1 mg via ORAL
  Filled 2013-07-30: qty 1

## 2013-07-30 MED ORDER — ACETAMINOPHEN 650 MG RE SUPP: 650.0000 mg | Freq: Four times a day (QID) | RECTAL | Status: DC | PRN

## 2013-07-30 MED ORDER — ACETAMINOPHEN 325 MG PO TABS: 650.0000 mg | ORAL_TABLET | Freq: Four times a day (QID) | ORAL | Status: DC | PRN

## 2013-07-30 MED ORDER — SODIUM CHLORIDE 0.9 % IV SOLN
1000.0000 mL | INTRAVENOUS | Status: DC
Start: 2013-07-30 — End: 2013-07-30
  Administered 2013-07-30: 1000 mL via INTRAVENOUS

## 2013-07-30 MED ORDER — DEXTROSE 5 % IV SOLN
1.0000 g | Freq: Once | INTRAVENOUS | Status: DC
  Filled 2013-07-30: qty 1

## 2013-07-30 MED ORDER — POTASSIUM CHLORIDE 10 MEQ/100ML IV SOLN
10.0000 meq | Freq: Once | INTRAVENOUS | Status: AC
  Administered 2013-07-30: 10 meq via INTRAVENOUS

## 2013-07-30 MED ORDER — DEXTROSE 5 % IV SOLN
1.0000 g | Freq: Once | INTRAVENOUS | Status: AC
  Filled 2013-07-30: qty 1

## 2013-07-30 MED ORDER — SODIUM CHLORIDE 0.9 % IJ SOLN
3.0000 mL | Freq: Two times a day (BID) | INTRAMUSCULAR | Status: DC
Start: 2013-07-30 — End: 2013-07-30

## 2013-07-30 MED ORDER — SODIUM CHLORIDE 0.9 % IV SOLN
INTRAVENOUS | Status: DC
  Administered 2013-07-30 (×2): via INTRAVENOUS

## 2013-07-30 MED ORDER — CEFAZOLIN SODIUM-DEXTROSE 2-3 GM-% IV SOLR: 2.0000 g | INTRAVENOUS | Status: DC

## 2013-07-30 MED ORDER — ENOXAPARIN SODIUM 30 MG/0.3ML ~~LOC~~ SOLN
30.0000 mg | SUBCUTANEOUS | Status: DC
  Administered 2013-07-30: 30 mg via SUBCUTANEOUS
  Filled 2013-07-30: qty 0.3

## 2013-07-30 MED ORDER — POTASSIUM CHLORIDE 10 MEQ/100ML IV SOLN
10.0000 meq | Freq: Once | INTRAVENOUS | Status: AC
  Administered 2013-07-30 (×2): 10 meq via INTRAVENOUS
  Filled 2013-07-30: qty 100

## 2013-07-30 MED ORDER — VANCOMYCIN HCL IN DEXTROSE 1-5 GM/200ML-% IV SOLN
1000.0000 mg | Freq: Once | INTRAVENOUS | Status: AC
  Administered 2013-07-30: 1000 mg via INTRAVENOUS
  Filled 2013-07-30: qty 200

## 2013-07-30 NOTE — ED Provider Notes (Signed)
CSN: [id number]     Arrival date & time 07/30/13  0426 History   First MD Initiated Contact with Patient 07/30/13 [phone]     Chief Complaint  Patient presents with  . Unresponsive      (Consider location/radiation/quality/duration/timing/severity/associated sxs/prior Treatment) The history is provided by a relative. The history is limited by the condition of the patient (Unresponsive).   68 year old male was brought in by a list because he was unresponsive at home. He has a history of metastatic cancer of the tonsils and was recently started on hospice. His mother states that he has been fed through feeding tube for the last year. Over the last 3 days, he has become progressively less responsive. He is on morphine solution for pain. His mother states that he had done all that he could do at home. He states that the cancer specialist at estimated his life expectancy at 4-6 months when he was last seen about one month ago.  Past Medical History  Diagnosis Date  . Thrombocytopenia 02/17/2011  . Anemia 02/17/2011  . Hyponatremia 02/17/2011  . Hypomagnesemia 02/17/2011  . Hypophosphatemia 02/17/2011  . Subdural hematoma 02/17/2011  . Hypogonadism male 02/17/2011    Low testosterone level.  . Occasional tremors   . Cancer     tonsilar  . Anemia, pernicious   . 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 Cervical disc surgery      X2  . Lumbar disc surgery      X2  . Cataract extraction, bilateral    . Insertion of vena cava filter    . Esophagogastroduodenoscopy (egd) with propofol N/A 09/25/2012    Procedure: ESOPHAGOGASTRODUODENOSCOPY (EGD) WITH PROPOFOL;  Surgeon: Rogene Houston, MD;  Location: [facility];  Service: Endoscopy;  Laterality: N/A;  . Peg placement N/A 09/25/2012    Procedure: PERCUTANEOUS ENDOSCOPIC GASTROSTOMY (PEG) PLACEMENT;  Surgeon: Rogene Houston, MD;  Location: [facility];  Service: Endoscopy;  Laterality: N/A;    Family History  Problem Relation Age of Onset  . Cancer Mother 81    pancreatic cancer  . Diabetes Daughter    History  Substance Use Topics  . Smoking status: Current Every Day Smoker -- 0.50 packs/day for 50 years  . Smokeless tobacco: Never Used  . Alcohol Use: Yes     Comment: 1-2 gallon/wk vodka    Review of Systems  Unable to perform ROS: Patient unresponsive      Allergies  Review of patient's allergies indicates no known allergies.  Home Medications   Current Outpatient Rx  Name  Route  Sig  Dispense  Refill  . benzocaine (ORAJEL) 10 % mucosal gel   Mouth/Throat   Use as directed 1 application in the mouth or throat as needed for mouth pain.         . cyanocobalamin (,VITAMIN B-12,) 1000 MCG/ML injection   Intramuscular   Inject 1,000 mcg into the muscle every 30 (thirty) days.         . folic acid (FOLVITE) 1 MG tablet   Oral   Take 1 tablet (1 mg total) by mouth 2 (two) times daily.   60 tablet   1   . Insulin Syringe-Needle U-100 (B-D INS SYR MICROFINE .3CC/28G) 28G X 1/2" 0.3 ML MISC   Does not apply   1 Units by Does not apply route once.   5 each   0   . lidocaine (XYLOCAINE) 2 %  solution   Mouth/Throat   Use as directed 20 mLs in the mouth or throat every 3 (three) hours as needed for mouth pain.   600 mL   3   . morphine 20 MG/5ML solution      Take one teaspoonful orally or by PEG tube every 2-4 hours as needed for pain. Two teaspoonfuls at bedtime.   500 mL   0   . nystatin-triamcinolone ointment (MYCOLOG)      After washing stomal area with soap and water, dry thoroughly and apply ointment followed by dusting with cornstarch twice a day.   30 g   4    BP 83/60  Pulse 118  Temp(Src) 99.9 F (37.7 C) (Rectal)  Resp 28  SpO2 96% Physical Exam  Nursing note and vitals reviewed.  68 year old male, ill appearing, but in no acute distress. Vital signs are significant for tachycardia with heart rate 138, tachypnea with  respiratory rate of 36, and hypertension with blood pressure 65/51. Oxygen saturation is 78%, which is hypoxic. Head is normocephalic and atraumatic. PERRLA. He is unable to open his mouth. There is general inflammation of the gingiva and dentition is in poor shape. Neck has numerous nodules in the skin which are apparently metastases from his known cancer. Back is nontender and there is no CVA tenderness. Lungs are clear without rales, wheezes, or rhonchi. Decreased air flow throughout is noted. Chest is nontender. Heart is tachycardic without murmur. Abdomen is soft, flat, nontender without masses or hepatosplenomegaly and peristalsis is normoactive. Feeding tube is present in the upper abdomen. Extremities are cachectic cyanosis or edema, full range of motion is present. Skin is warm and dry without rash. Neurologic: HE is minimally responsive to painful stimuli, nonverbal, does not follow commands, cranial nerves are grossly intact, there are no gross motor or sensory deficits.  ED Course  Procedures (including critical care time) Labs Review Labs Reviewed  CBC WITH DIFFERENTIAL  COMPREHENSIVE METABOLIC PANEL  URINALYSIS, ROUTINE W REFLEX MICROSCOPIC  LACTIC ACID, PLASMA   Imaging Review Dg Chest Port 1 View  07/30/2013   CLINICAL DATA:  Unresponsive.  EXAM: PORTABLE CHEST - 1 VIEW  COMPARISON:  01/05/2013  FINDINGS: Increased markings at the bases is likely related to hypoaeration and background bronchitic change. No evidence of consolidating pneumonia, edema, effusion, or pneumothorax. Normal heart size.  IMPRESSION: Bronchitic changes accentuated by hypoaeration.   Electronically Signed   By: Jorje Guild M.D.   On: 07/30/2013 05:07   CRITICAL CARE Performed by: BOFBP,ZWCHE Total critical care time: 80 minutes Critical care time was exclusive of separately billable procedures and treating other patients. Critical care was necessary to treat or prevent imminent or  life-threatening deterioration. Critical care was time spent personally by me on the following activities: development of treatment plan with patient and/or surrogate as well as nursing, discussions with consultants, evaluation of patient's response to treatment, examination of patient, obtaining history from patient or surrogate, ordering and performing treatments and interventions, ordering and review of laboratory studies, ordering and review of radiographic studies, pulse oximetry and re-evaluation of patient's condition.  MDM   Final diagnoses:  Altered mental status  Hypotension  Hypoxia  Metastatic cancer  Hypokalemia  Hypercalcemia  Renal insufficiency  Leukocytosis  Metabolic alkalosis  Elevated lactic acid level     Hypotension, hypoxia in terminally ill cancer patient. Old records are reviewed and he was offered chemotherapy one month ago and initially had planned to go through with it  but then changed his mind. His brother is here and I have discussed CODE STATUS and other states quite clearly that the patient would not want to be intubated and does not want CPR initiated so he has made no CODE BLUE. He is placed on oxygen via nonrebreather mask and oxygen saturations improved to 95%. He is given aggressive IV hydration. Chest x-rays obtained showing no evidence of pneumonia. He will need to be admitted for fluid resuscitation,. During that time, consideration can be given to transfer to nursing home if he does survive his hospitalization.  After 1 L, he is somewhat more alert and is moving more and blood pressure has come up to the mid 80s. IV hydration is being continued.  After 1.5 L of IV fluid, blood pressures over 100 and heart rate is down to 104. He is noted to have severe hypocalcemia which probably is the main reason for his altered mentation. Severe hypokalemia is also noted. He is started on intravenous replacement of his potassium and will also be given a dose of  potassium through his PEG tube. IV hydration is appropriate initial treatment for hypercalcemia. Furosemide can be given once blood pressure has reached an adequate level. These findings are explained to the brother. Case is discussed with Dr. Hal Hope [facility] who agrees to admit the patient.  Delora Fuel, MD 16/10/96 0454

## 2013-07-30 NOTE — ED Notes (Signed)
Pt arrived by EMS from home. Pt is under Hospice care & they advised unable to do anything else for pt. Pt dx with throat CA & is not undergoing treatment. EMS called out for unresponsive pt.

## 2013-07-30 NOTE — ED Notes (Signed)
CRITICAL VALUE ALERT  Critical value received:  Potassium 2.3                                        Calcium 17  Date of notification:  07/30/13  Time of notification:  0526  Critical value read back:yes  Nurse who received alert:  Derek Mound, RN  MD notified (1st page):  Roxanne Mins  Time of first page:  0526  MD notified (2nd page):  Time of second page:  Responding MD:  Roxanne Mins   Time MD responded:  [phone]

## 2013-07-30 NOTE — Clinical Social Work Note (Signed)
Patient discharging to [facility] today, will transfer via EMS scheduled to arrive at [facility] approx 1:45 today.  Brother will be signing admission paperwork at hospice home approx 2 PM.  CSW spoke w brother, is agreeable to plan.  MD informed.  Discharge summary faxed to Hospice Home via [facility], discharge packet provided to RN for transport.  CSW signing off at this time as no further SW needs identified.  Edwyna Shell, LCSW Clinical Social Worker [phone])

## 2013-07-30 NOTE — Clinical Social Work Note (Signed)
Per brother, brother cannot continue to care for patient at home at this point, patient's two daughters who live in the home are unable to care for patient either.  Hospice has been involved since 07/25/12, brother thought they would provide more help w caregiving than hospice at home actually provides.  Brother has now told Hospice SW that he would like residential hospice placement.  Documents faxed, facility contacted.  Edwyna Shell, LCSW Clinical Social Worker [phone])

## 2013-07-30 NOTE — Clinical Social Work Note (Signed)
CSW spoke w Jinny Blossom, Education officer, museum at [facility] - patient is one of their home hospice patients.  Prior to this admission, hospice states that he has been a full code, says that family has told hospice SW that they now want him to be DNR and comfort care.  Per hospice SW, they are considering requesting residential hospice placement, but have not made up their minds yet and may want to take patient home w hospice at discharge.    Edwyna Shell, LCSW Clinical Social Worker [phone])

## 2013-07-30 NOTE — Care Management Note (Signed)
UR completed 

## 2013-07-30 NOTE — Progress Notes (Signed)
PT TRANSFERRING TO HOSPICE HOUSE. TRANSFER REPORT CALLED TO MEREDITH AT [facility]. PT IS DROWSY BUT COHERANT. SPEECH IS GRABLED D/T THE EFFECT OF HIS CA, BUT ABLE TO COMMUNICATE NEEDS. O2 AT 4L/MIN VIA [place]. O2 SAT 96%. PT HAS NUMEROUS NODULES AND CRUSTY AREAS ON FACE AND NECK WHICH HE WILL NOT LET us ATTEMPT TO CLEAN. PEG TUBE IN PLACE AND PATENT. LT WRIST #20 NSL IN PLACE AND PATENT. CONDOM/EXTERNAL CATH IN PLACE DRAINING DARL YELLOW URINE.PT IS A DRN. MORPHINE 20MG  LIQUID GIVEN VIA PEG TUBE BEFORE BEING TRANSFER.

## 2013-07-30 NOTE — Progress Notes (Signed)
Nutrition Brief Note  Chart reviewed. Pt now transitioning to comfort care.  No further nutrition interventions warranted at this time.  Please re-consult as needed.   Shyler Hamill A. Dannis Deroche, RD, LDN Pager: [phone]  

## 2013-07-30 NOTE — ED Notes (Signed)
Pt is end stage throat cancer who lives at home at this time. Pt has started seeing hospice nurse, tonight family called ems for pt being "unconscious"  Pt to e.d.via ems with minimal response to verbal.

## 2013-07-30 NOTE — Discharge Summary (Signed)
Physician Discharge Summary  Zachary Fowler HCW:[id number] DOB: 1945-08-27 DOA: 07/30/2013  PCP: Kirk Ruths, MD  Admit date: 07/30/2013 Discharge date: 07/30/2013  Time spent: 45 minutes  Recommendations for Outpatient Follow-up:  1. 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  HPI: Zachary Fowler is a 68 y.o. male history of metastatic tonsillar carcinoma who is presently in hospice was brought to the ER patient was brought to be increasingly unresponsive over the last 3 days. In the ER patient was found to have labs showing severe hypercalcemia and renal failure with leukocytosis. Patient has been started IV fluids following which patient became more alert awake and presently is following commands. Patient otherwise has not had any nausea vomiting chest pain or shortness of breath fever chills or diarrhea. Patient gets feeds through PEG tube. Patient is usually bed bound.   Hospital Course:  He was admitted with progressive metabolic encephalopathy in setting of  severe hypercalcemia, acute renal failure and dehydration. Patient also has significant leukocytosis, likely leukemoid reaction from progressive tonsillar carcinoma. He has dysphagia due to his cancer and gets nutrition via PEG. From his metastatic tonsillar CA standpoint, he was started on Hospice care last week and given a life expectancy of 4-79months 1 month ago. His family is unable to care for him at home, and his brother who is his primary care giver is agreeable to full comfort care and hence will be transitioned to  Residential Hospice   Discharge Exam: Filed Vitals:   07/30/13 1000  BP: 110/73  Pulse:   Temp:   Resp: 21    General: alert, awake, confused Cardiovascular: S1S2/RRR, tachycardic Respiratory: conducted upper airway sounds  Discharge Instructions  Discharge Orders   Future Orders Complete By Expires   Increase activity slowly  As directed        Medication List    STOP taking these medications       cyanocobalamin 1000 MCG/ML injection  Commonly known as:  (VITAMIN B-12)     Insulin Syringe-Needle U-100 28G X 1/2" 0.3 ML Misc  Commonly known as:  B-D INS SYR MICROFINE .3CC/28G      TAKE these medications       benzocaine 10 % mucosal gel  Commonly known as:  ORAJEL  Use as directed 1 application in the mouth or throat as needed for mouth pain.     folic acid 1 MG tablet  Commonly known as:  FOLVITE  Take 1 tablet (1 mg total) via PEG 2 (two) times daily.     lidocaine 2 % solution  Commonly known as:  XYLOCAINE  Use as directed 20 mLs in the mouth or throat every 3 (three) hours as needed for mouth pain.     morphine 20 MG/5ML solution  Take 1.3 mLs (5.2 mg total) by mouth every 2 (two) hours as needed for pain. Take one teaspoonful via PEG tube every 2-4 hours as needed for pain. Two teaspoonfuls at bedtime.     nystatin-triamcinolone ointment  Commonly known as:  MYCOLOG  After washing stomal area with soap and water, dry thoroughly and apply ointment followed by dusting with cornstarch twice a day.       No Known Allergies     Follow-up Information   Follow up with Kirk Ruths, MD. Schedule an appointment as soon as possible for a visit in 1 week.   Specialty:  Family Medicine   Contact information:   [address] [place] [address] [phone]        The results of significant diagnostics from this hospitalization (including imaging, microbiology, ancillary and laboratory) are listed below for reference.     Significant Diagnostic Studies: Dg Chest Port 1 View  07/30/2013   CLINICAL DATA:  Unresponsive.  EXAM: PORTABLE CHEST - 1 VIEW  COMPARISON:  01/05/2013  FINDINGS: Increased markings at the bases is likely related to hypoaeration and background bronchitic change. No evidence of consolidating pneumonia, edema, effusion, or pneumothorax. Normal heart size.  IMPRESSION: Bronchitic changes accentuated by hypoaeration.   Electronically Signed   By: Tiburcio Pea M.D.   On: 07/30/2013 05:07    Microbiology: Recent Results (from the past 240 hour(s))  CULTURE, BLOOD (ROUTINE X 2)     Status: None   Collection Time    07/30/13  6:40 AM      Result Value Ref Range Status   Specimen Description Blood   Final   Special Requests NONE   Final   Culture NO GROWTH <24 HRS   Final   Report Status PENDING   Incomplete  CULTURE, BLOOD (ROUTINE X 2)     Status: None   Collection Time    07/30/13  6:45 AM      Result Value Ref Range Status   Specimen Description Blood   Final   Special Requests NONE   Final   Culture NO GROWTH <24 HRS   Final   Report Status PENDING   Incomplete     Labs: Basic Metabolic Panel:  Recent Labs Lab 07/30/13 0435  NA 147  K 2.3*  CL 95*  CO2 39*  GLUCOSE 120*  BUN 40*  CREATININE 2.60*  CALCIUM >15.0*   Liver Function Tests:  Recent Labs Lab 07/30/13 0435  AST 30  ALT 14  ALKPHOS 105  BILITOT 1.4*  PROT 7.6  ALBUMIN 2.9*   No results found for this basename: LIPASE, AMYLASE,  in the last 168 hours No results found for this basename: AMMONIA,  in the last 168 hours CBC:  Recent Labs Lab 07/30/13 0435  WBC 47.6*  NEUTROABS 44.7*  HGB 16.1  HCT 47.1  MCV 103.3*  PLT 261   Cardiac Enzymes: No results found for this basename: CKTOTAL, CKMB, CKMBINDEX, TROPONINI,  in the last 168 hours BNP: BNP (last 3 results) No results found for this basename: PROBNP,  in the last 8760 hours CBG:  Recent Labs Lab 07/30/13 0749  GLUCAP 152*        Signed:  Kaulin Chaves  [facility] 07/30/2013, 11:05 AM

## 2013-07-30 NOTE — Progress Notes (Signed)
ANTIBIOTIC CONSULT NOTE - INITIAL  Pharmacy Consult for Vancomycin & Cefepime Indication: rule out sepsis  No Known Allergies  Patient Measurements: Weight: 160 lb (72.576 kg)  Vital Signs: Temp: 97.8 F (36.6 C) (04/01 0758) Temp src: Axillary (04/01 0758) BP: 113/86 mmHg (04/01 0630) Pulse Rate: 112 (04/01 0742) Intake/Output from previous day: 03/31 0701 - 04/01 0700 In: 3000 [I.V.:3000] Out: -  Intake/Output from this shift:    Labs:  Recent Labs  07/30/13 0435  WBC 47.6*  HGB 16.1  PLT 261  CREATININE 2.60*   The CrCl is unknown because both a height and weight (above a minimum accepted value) are required for this calculation. No results found for this basename: VANCOTROUGH, VANCOPEAK, VANCORANDOM, GENTTROUGH, GENTPEAK, GENTRANDOM, TOBRATROUGH, TOBRAPEAK, TOBRARND, AMIKACINPEAK, AMIKACINTROU, AMIKACIN,  in the last 72 hours   Microbiology: No results found for this or any previous visit (from the past 720 hour(s)).  Medical History: Past Medical History  Diagnosis Date  . Thrombocytopenia 02/17/2011  . Anemia 02/17/2011  . Hyponatremia 02/17/2011  . Hypomagnesemia 02/17/2011  . Hypophosphatemia 02/17/2011  . Subdural hematoma 02/17/2011  . Hypogonadism male 02/17/2011    Low testosterone level.  . Occasional tremors   . Cancer     tonsilar  . Anemia, pernicious   . Squamous cell carcinoma of tonsil 09/18/2012    Medications:  Scheduled:  . sodium chloride   Intravenous STAT  . calcitonin  200 Units Subcutaneous Once  . ceFEPime (MAXIPIME) IV  1 g Intravenous Once  . cyanocobalamin  1,000 mcg Intramuscular Q30 days  . enoxaparin (LOVENOX) injection  30 mg Subcutaneous Q24H  . folic acid  1 mg Oral BID  . pamidronate  60 mg Intravenous Once  . potassium chloride  10 mEq Intravenous Once  . potassium chloride      . potassium chloride  40 mEq Per Tube Daily  . sodium chloride  3 mL Intravenous Q12H   Assessment: 68 yo M with metastatic CA  currently receiving Hospice care.  He presents with AMS & unresponsiveness for several days.  He has elevated lactic acid level, leukocytosis, & tachycardia on admission.  He remains afebrile. Cx data pending. Other metabolic abnormalities noted. Empiric antibiotics started in ED.  Acute renal dysfunction on admit.  Estimated CrCl 25-30 ml/min.  Vancomycin 4/1>> Cefepime 4/1>>  Goal of Therapy:  Vancomycin trough level 15-20 mcg/ml  Plan:  Cefepime 1gm IV q24h Vancomycin 750mg  IV q24h Check Vancomycin trough at steady state Monitor renal function and cx data   Biagio Borg 07/30/2013,8:02 AM

## 2013-07-30 NOTE — ED Notes (Signed)
Pt removed from non rebreather & placed on 4l O2 via nasal canula.

## 2013-07-30 NOTE — Progress Notes (Signed)
RN SPOKE WITH PT AND HIS 2 DAUGHTERS' [place]. ALL ARE IN AGREEMENT FOR PT TO GO TO HOSPICE HOUSE. BROTHER IS ALSO AGREEABLE. THIS RN HAS SPOKEN TO HOSPICE NURSE RON AND A BED IS NOW AVAILABLE.
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
## 2013-07-30 NOTE — Clinical Social Work Note (Signed)
Zachary Fowler, admissions at [facility], has accepted patient.  Asks that he arrive approx 2 PM as brother cannot get to facility before then, needs to sign preadmission paperwork before patient arrives.  EMS contacted and asked to transport approx 1:45.  Edwyna Shell, LCSW Clinical Social Worker [phone])

## 2013-07-30 NOTE — H&P (Signed)
[facility] History and Physical  MILLER GRIMMETT NWG:[id number] DOB: 1946/01/07 DOA: 07/30/2013  Referring physician: ER physician. PCP: Kirk Ruths, MD   Chief Complaint: Altered mental status.  History obtained from patient's brother ER physician and previous records.  HPI: Zachary Fowler is a 68 y.o. male history of metastatic tonsillar carcinoma who is presently in hospice was brought to the ER patient was brought to be increasingly unresponsive over the last 3 days. In the ER patient was found to have labs showing severe hypercalcemia and renal failure with leukocytosis. Patient has been started IV fluids following which patient became more alert awake and presently is following commands. Patient otherwise has not had any nausea vomiting chest pain or shortness of breath fever chills or diarrhea. Patient gets feeds through PEG tube. Patient is usually bed bound.   Review of Systems: As presented in the history of presenting illness, rest negative.  Past Medical History  Diagnosis Date  . Thrombocytopenia 02/17/2011  . Anemia 02/17/2011  . Hyponatremia 02/17/2011  . Hypomagnesemia 02/17/2011  . Hypophosphatemia 02/17/2011  . Subdural hematoma 02/17/2011  . Hypogonadism male 02/17/2011    Low testosterone level.  . Occasional tremors   . Cancer     tonsilar  . Anemia, pernicious   . 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 Cervical disc surgery      X2  . Lumbar disc surgery      X2  . Cataract extraction, bilateral    . Insertion of vena cava filter    . Esophagogastroduodenoscopy (egd) with propofol N/A 09/25/2012    Procedure: ESOPHAGOGASTRODUODENOSCOPY (EGD) WITH PROPOFOL;  Surgeon: Malissa Hippo, MD;  Location: [facility] ORS;  Service: Endoscopy;  Laterality: N/A;  . Peg placement N/A 09/25/2012    Procedure: PERCUTANEOUS ENDOSCOPIC GASTROSTOMY (PEG) PLACEMENT;  Surgeon: Malissa Hippo, MD;  Location: [facility]  ORS;  Service: Endoscopy;  Laterality: N/A;   Social History:  reports that he has been smoking.  He has never used smokeless tobacco. He reports that he drinks alcohol. He reports that he does not use illicit drugs. Where does patient live home. Can patient participate in ADLs? No.  No Known Allergies  Family History:  Family History  Problem Relation Age of Onset  . Cancer Mother 15    pancreatic cancer  . Diabetes Daughter       Prior to Admission medications   Medication Sig Start Date End Date Taking? Authorizing Provider  benzocaine (ORAJEL) 10 % mucosal gel Use as directed 1 application in the mouth or throat as needed for mouth pain.    Historical Provider, MD  cyanocobalamin (,VITAMIN B-12,) 1000 MCG/ML injection Inject 1,000 mcg into the muscle every 30 (thirty) days.    Historical Provider, MD  folic acid (FOLVITE) 1 MG tablet Take 1 tablet (1 mg total) by mouth 2 (two) times daily. 12/09/12   Marin Roberts, MD  Insulin Syringe-Needle U-100 (B-D INS SYR MICROFINE .3CC/28G) 28G X 1/2" 0.3 ML MISC 1 Units by Does not apply route once. 01/24/13   Alla German, MD  lidocaine (XYLOCAINE) 2 % solution Use as directed 20 mLs in the mouth or throat every 3 (three) hours as needed for mouth pain. 06/23/13   Ellouise Newer, PA-C  morphine 20 MG/5ML solution Take one teaspoonful orally or by PEG tube every 2-4 hours as needed for pain. Two teaspoonfuls at bedtime. 07/01/13   Alla German, MD  nystatin-triamcinolone ointment (MYCOLOG) After washing stomal area with soap and water, dry thoroughly and apply ointment followed by dusting with cornstarch twice a day. 04/18/13   Alla German, MD    Physical Exam: Filed Vitals:   07/30/13 0500 07/30/13 0508 07/30/13 0619 07/30/13 0630  BP: 83/60 83/60 99/58  113/86  Pulse: 119 118 106 104  Temp:  99.9 F (37.7 C)    TempSrc:  Rectal    Resp: 25 28 24 25   Weight:      SpO2: 97% 96% 97% 97%     General:  Well-developed and  poorly nourished.  Eyes: Anicteric no pallor.  ENT: No discharge from the ears eyes nose mouth.  Neck multiple nodules on the neck.  Cardiovascular: S1-S2 heard.  Respiratory: No rhonchi or crepitations.  Abdomen: Soft nontender bowel sounds present. PEG tube present.  Skin: Multiple skin nodules on the neck.  Musculoskeletal: No edema.  Psychiatric: Patient is confused.  Neurologic: Patient is presently alert but appears confused but follows commands and moves all extremities.  Labs on Admission:  Basic Metabolic Panel:  Recent Labs Lab 07/30/13 0435  NA 147  K 2.3*  CL 95*  CO2 39*  GLUCOSE 120*  BUN 40*  CREATININE 2.60*  CALCIUM >15.0*   Liver Function Tests:  Recent Labs Lab 07/30/13 0435  AST 30  ALT 14  ALKPHOS 105  BILITOT 1.4*  PROT 7.6  ALBUMIN 2.9*   No results found for this basename: LIPASE, AMYLASE,  in the last 168 hours No results found for this basename: AMMONIA,  in the last 168 hours CBC:  Recent Labs Lab 07/30/13 0435  WBC 47.6*  NEUTROABS 44.7*  HGB 16.1  HCT 47.1  MCV 103.3*  PLT 261   Cardiac Enzymes: No results found for this basename: CKTOTAL, CKMB, CKMBINDEX, TROPONINI,  in the last 168 hours  BNP (last 3 results) No results found for this basename: PROBNP,  in the last 8760 hours CBG: No results found for this basename: GLUCAP,  in the last 168 hours  Radiological Exams on Admission: Dg Chest Port 1 View  07/30/2013   CLINICAL DATA:  Unresponsive.  EXAM: PORTABLE CHEST - 1 VIEW  COMPARISON:  01/05/2013  FINDINGS: Increased markings at the bases is likely related to hypoaeration and background bronchitic change. No evidence of consolidating pneumonia, edema, effusion, or pneumothorax. Normal heart size.  IMPRESSION: Bronchitic changes accentuated by hypoaeration.   Electronically Signed   By: Tiburcio Pea M.D.   On: 07/30/2013 05:07     Assessment/Plan Principal Problem:   Acute encephalopathy Active  Problems:   Hypokalemia   Squamous cell carcinoma of tonsil   Leucocytosis   Hypercalcemia   Acute renal failure   1. Acute encephalopathy - most likely secondary to metabolic derangements. Patient has severe hypercalcemia acute renal failure and dehydration. Patient also has significant leukocytosis. Presently after starting hydration patient's mentation has improved. 2. Severe hypercalcemia - probably related to cancer. I have ordered pamidronate and calcitonin. Recheck metabolic panel at around 11 AM. 3. Leukocytosis - possible sepsis. Have ordered empiric antibiotics for now. Follow cultures. 4. Hypokalemia - patient is receiving IV potassium chloride and also has received through PEG tube. Recheck metabolic panel at around 11 AM. 5. Acute renal failure - probably from dehydration. 6. Metastatic tonsillar carcinoma - presently in hospice. 7. Protein calorie malnutrition.   At this time patient's family has requested IV fluids and medications but does not want anything further aggressive measures. If patient's  condition does not improve in 24 hours then family was to make him full comfort measures.  Code Status: DO NOT RESUSCITATE.  Family Communication: Patient's brother.  Disposition Plan: Admit to inpatient.    Timi Reeser N. [facility] Pager [phone].  If 7PM-7AM, please contact night-coverage [email or web address] Password [facility] 07/30/2013, 6:51 AM

## 2013-08-04 LAB — CULTURE, BLOOD (ROUTINE X 2)
Culture: NO GROWTH
Culture: NO GROWTH

## 2013-08-29 DEATH — deceased

## 2014-05-29 IMAGING — CR DG CHEST 1V PORT
1 series · 1 of 1 positions shown · non-contrast
Comparison: 01/05/2013

CLINICAL DATA: Unresponsive.

EXAM:
PORTABLE CHEST - 1 VIEW

[portable]
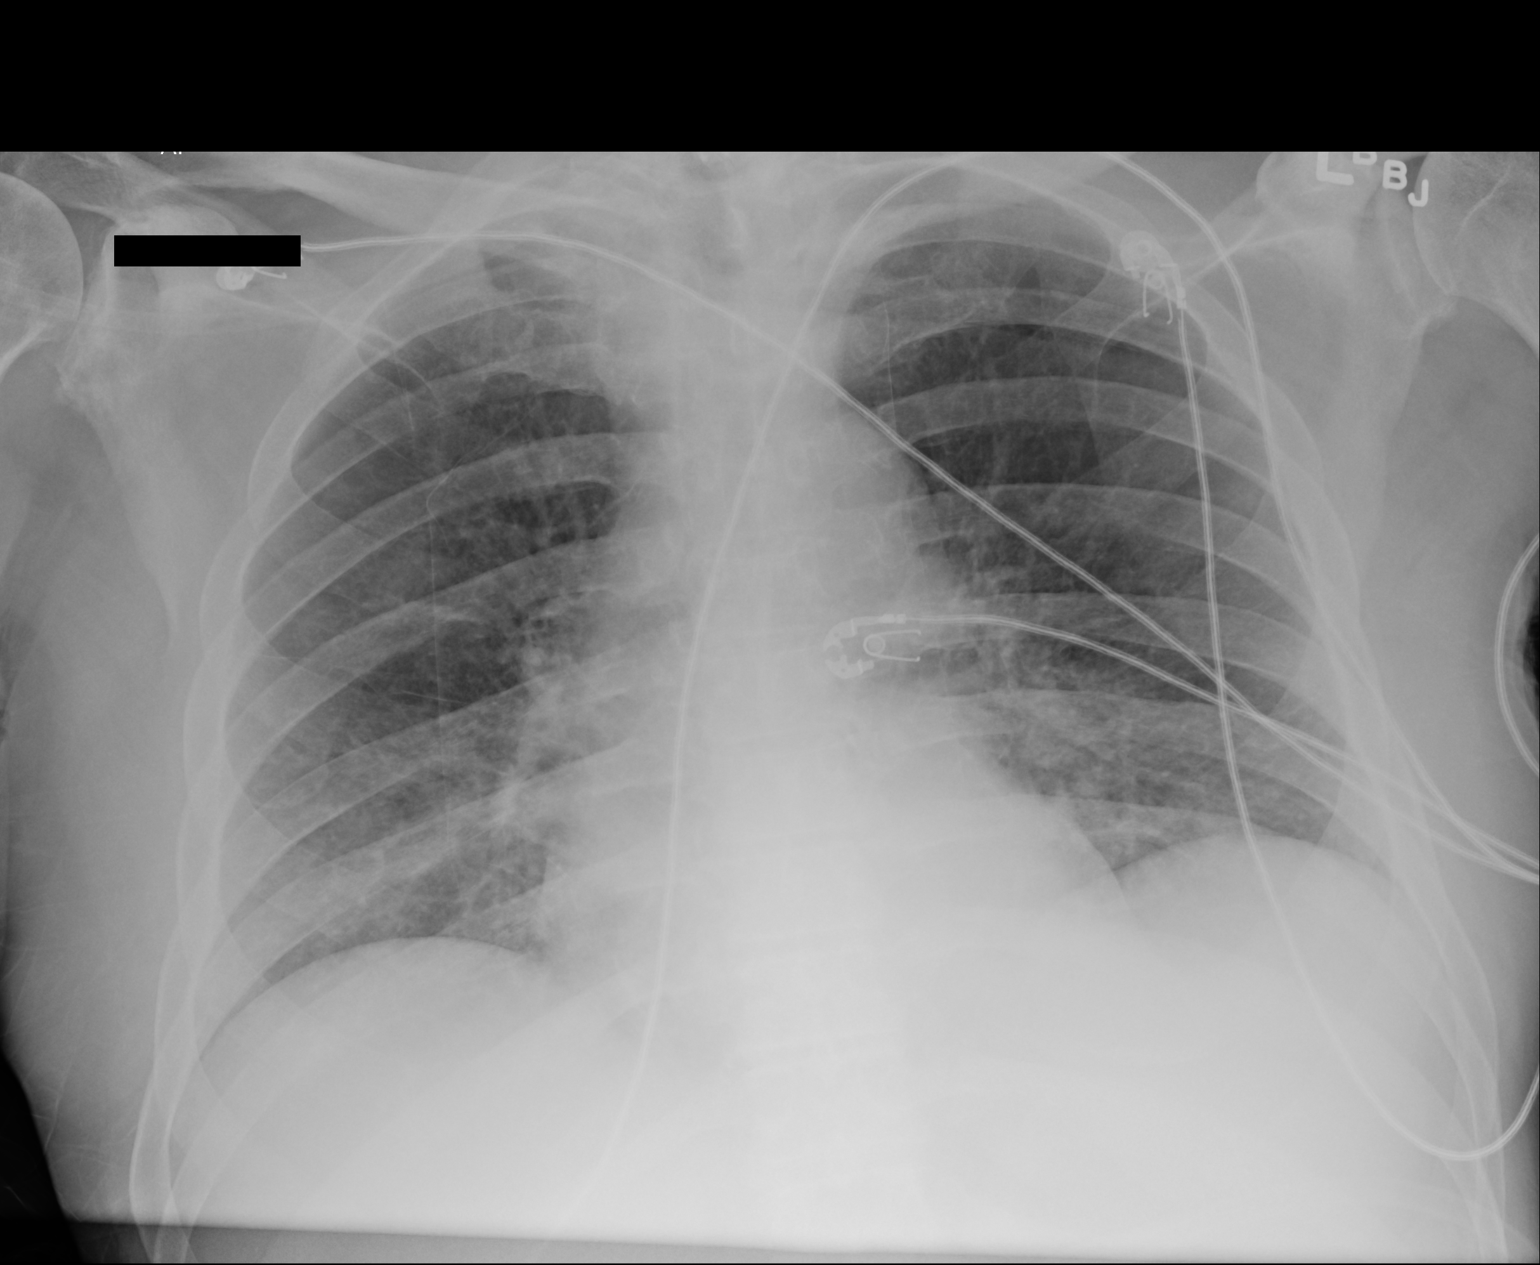

[1 of 1 positions shown; findings below may reference images not displayed]

FINDINGS: Increased markings at the bases is likely related to hypoaeration
and background bronchitic change. No evidence of consolidating
pneumonia, edema, effusion, or pneumothorax. Normal heart size.
IMPRESSION: Bronchitic changes accentuated by hypoaeration.
